# Patient Record
Sex: Male | Born: 1935
Health system: Southern US, Community
[De-identification: ages and names within clinical notes are randomized; demographics above are authoritative.]

## PROBLEM LIST (undated history)

## (undated) DIAGNOSIS — G039 Meningitis, unspecified: Secondary | ICD-10-CM

## (undated) DIAGNOSIS — I10 Essential (primary) hypertension: Secondary | ICD-10-CM

## (undated) DIAGNOSIS — G5600 Carpal tunnel syndrome, unspecified upper limb: Secondary | ICD-10-CM

## (undated) DIAGNOSIS — I480 Paroxysmal atrial fibrillation: Secondary | ICD-10-CM

## (undated) DIAGNOSIS — I739 Peripheral vascular disease, unspecified: Secondary | ICD-10-CM

## (undated) DIAGNOSIS — I779 Disorder of arteries and arterioles, unspecified: Secondary | ICD-10-CM

## (undated) DIAGNOSIS — E785 Hyperlipidemia, unspecified: Secondary | ICD-10-CM

## (undated) DIAGNOSIS — IMO0001 Reserved for inherently not codable concepts without codable children: Secondary | ICD-10-CM

## (undated) DIAGNOSIS — K279 Peptic ulcer, site unspecified, unspecified as acute or chronic, without hemorrhage or perforation: Secondary | ICD-10-CM

## (undated) DIAGNOSIS — I2510 Atherosclerotic heart disease of native coronary artery without angina pectoris: Secondary | ICD-10-CM

## (undated) DIAGNOSIS — Z953 Presence of xenogenic heart valve: Secondary | ICD-10-CM

## (undated) HISTORY — PX: VASECTOMY: SHX75

## (undated) HISTORY — PX: APPENDECTOMY: SHX54

## (undated) HISTORY — PX: HEMORRHOID SURGERY: SHX153

## (undated) HISTORY — PX: CORONARY ARTERY BYPASS GRAFT: SHX141

## (undated) HISTORY — DX: Disorder of arteries and arterioles, unspecified: I77.9

## (undated) HISTORY — DX: Atherosclerotic heart disease of native coronary artery without angina pectoris: I25.10

## (undated) HISTORY — PX: TONSILLECTOMY: SHX5217

## (undated) HISTORY — PX: CATARACT EXTRACTION W/ INTRAOCULAR LENS IMPLANT: SHX1309

## (undated) HISTORY — DX: Essential (primary) hypertension: I10

## (undated) HISTORY — PX: STERNOTOMY: SHX1057

## (undated) HISTORY — DX: Peripheral vascular disease, unspecified: I73.9

## (undated) HISTORY — DX: Carpal tunnel syndrome, unspecified upper limb: G56.00

## (undated) HISTORY — DX: Hyperlipidemia, unspecified: E78.5

## (undated) HISTORY — PX: GANGLION CYST EXCISION: SHX1691

## (undated) HISTORY — DX: Peptic ulcer, site unspecified, unspecified as acute or chronic, without hemorrhage or perforation: K27.9

## (undated) HISTORY — DX: Reserved for inherently not codable concepts without codable children: IMO0001

## (undated) HISTORY — DX: Paroxysmal atrial fibrillation: I48.0

## (undated) HISTORY — DX: Presence of xenogenic heart valve: Z95.3

## (undated) HISTORY — DX: Meningitis, unspecified: G03.9

---

## 1965-05-02 HISTORY — PX: INGUINAL HERNIA REPAIR: SHX194

## 1987-05-03 HISTORY — PX: PENILE PROSTHESIS IMPLANT: SHX240

## 1990-05-23 HISTORY — PX: CARDIAC CATHETERIZATION: SHX172

## 1990-07-12 HISTORY — PX: CORONARY ANGIOPLASTY: SHX604

## 1990-07-26 HISTORY — PX: CARDIAC CATHETERIZATION: SHX172

## 1991-05-17 HISTORY — PX: CARDIAC CATHETERIZATION: SHX172

## 1999-05-05 ENCOUNTER — Encounter: Payer: Self-pay | Admitting: Urology

## 1999-05-05 ENCOUNTER — Encounter: Admission: RE | Admit: 1999-05-05 | Discharge: 1999-05-05 | Payer: Self-pay | Admitting: Urology

## 2000-06-14 ENCOUNTER — Encounter: Admission: RE | Admit: 2000-06-14 | Discharge: 2000-06-14 | Payer: Self-pay | Admitting: Urology

## 2000-06-14 ENCOUNTER — Encounter: Payer: Self-pay | Admitting: Urology

## 2001-07-30 ENCOUNTER — Encounter: Payer: Self-pay | Admitting: Urology

## 2001-08-02 ENCOUNTER — Observation Stay (HOSPITAL_COMMUNITY): Admission: RE | Admit: 2001-08-02 | Discharge: 2001-08-03 | Payer: Self-pay | Admitting: Urology

## 2002-01-23 ENCOUNTER — Ambulatory Visit (HOSPITAL_COMMUNITY): Admission: RE | Admit: 2002-01-23 | Discharge: 2002-01-23 | Payer: Self-pay | Admitting: Cardiology

## 2002-01-23 ENCOUNTER — Encounter: Payer: Self-pay | Admitting: Cardiology

## 2002-04-09 ENCOUNTER — Encounter: Payer: Self-pay | Admitting: Emergency Medicine

## 2002-04-09 ENCOUNTER — Emergency Department (HOSPITAL_COMMUNITY): Admission: EM | Admit: 2002-04-09 | Discharge: 2002-04-09 | Payer: Self-pay | Admitting: Emergency Medicine

## 2002-07-25 ENCOUNTER — Ambulatory Visit (HOSPITAL_COMMUNITY): Admission: RE | Admit: 2002-07-25 | Discharge: 2002-07-25 | Payer: Self-pay | Admitting: Cardiology

## 2002-07-25 HISTORY — PX: CARDIAC CATHETERIZATION: SHX172

## 2002-10-18 ENCOUNTER — Encounter: Payer: Self-pay | Admitting: Gastroenterology

## 2002-10-18 ENCOUNTER — Encounter: Admission: RE | Admit: 2002-10-18 | Discharge: 2002-10-18 | Payer: Self-pay | Admitting: Gastroenterology

## 2002-11-13 ENCOUNTER — Ambulatory Visit (HOSPITAL_COMMUNITY): Admission: RE | Admit: 2002-11-13 | Discharge: 2002-11-13 | Payer: Self-pay | Admitting: Gastroenterology

## 2003-12-24 ENCOUNTER — Observation Stay (HOSPITAL_COMMUNITY): Admission: EM | Admit: 2003-12-24 | Discharge: 2003-12-26 | Payer: Self-pay | Admitting: Emergency Medicine

## 2004-01-16 ENCOUNTER — Emergency Department (HOSPITAL_COMMUNITY): Admission: EM | Admit: 2004-01-16 | Discharge: 2004-01-16 | Payer: Self-pay | Admitting: Emergency Medicine

## 2004-11-01 ENCOUNTER — Inpatient Hospital Stay (HOSPITAL_COMMUNITY): Admission: EM | Admit: 2004-11-01 | Discharge: 2004-11-06 | Payer: Self-pay | Admitting: Emergency Medicine

## 2004-11-01 ENCOUNTER — Encounter (INDEPENDENT_AMBULATORY_CARE_PROVIDER_SITE_OTHER): Payer: Self-pay | Admitting: *Deleted

## 2004-11-08 ENCOUNTER — Emergency Department (HOSPITAL_COMMUNITY): Admission: EM | Admit: 2004-11-08 | Discharge: 2004-11-08 | Payer: Self-pay | Admitting: Emergency Medicine

## 2004-11-10 ENCOUNTER — Ambulatory Visit (HOSPITAL_COMMUNITY): Admission: RE | Admit: 2004-11-10 | Discharge: 2004-11-10 | Payer: Self-pay | Admitting: General Surgery

## 2005-01-06 ENCOUNTER — Ambulatory Visit (HOSPITAL_COMMUNITY): Admission: RE | Admit: 2005-01-06 | Discharge: 2005-01-07 | Payer: Self-pay | Admitting: Urology

## 2006-04-14 ENCOUNTER — Encounter: Payer: Self-pay | Admitting: Cardiology

## 2006-06-20 ENCOUNTER — Encounter
Admission: RE | Admit: 2006-06-20 | Discharge: 2006-06-20 | Payer: Self-pay | Admitting: Physical Medicine and Rehabilitation

## 2007-04-02 HISTORY — PX: CARDIAC CATHETERIZATION: SHX172

## 2007-04-19 ENCOUNTER — Inpatient Hospital Stay (HOSPITAL_BASED_OUTPATIENT_CLINIC_OR_DEPARTMENT_OTHER): Admission: RE | Admit: 2007-04-19 | Discharge: 2007-04-19 | Payer: Self-pay | Admitting: Cardiology

## 2007-04-20 ENCOUNTER — Ambulatory Visit: Payer: Self-pay | Admitting: Cardiothoracic Surgery

## 2007-05-31 ENCOUNTER — Encounter: Payer: Self-pay | Admitting: Cardiothoracic Surgery

## 2007-06-04 ENCOUNTER — Inpatient Hospital Stay (HOSPITAL_COMMUNITY): Admission: RE | Admit: 2007-06-04 | Discharge: 2007-06-11 | Payer: Self-pay | Admitting: Cardiothoracic Surgery

## 2007-06-04 ENCOUNTER — Encounter: Payer: Self-pay | Admitting: Cardiothoracic Surgery

## 2007-06-04 ENCOUNTER — Ambulatory Visit: Payer: Self-pay | Admitting: Cardiothoracic Surgery

## 2007-06-12 ENCOUNTER — Encounter (INDEPENDENT_AMBULATORY_CARE_PROVIDER_SITE_OTHER): Payer: Self-pay | Admitting: Family Medicine

## 2007-06-28 ENCOUNTER — Ambulatory Visit: Payer: Self-pay | Admitting: Cardiothoracic Surgery

## 2007-06-28 ENCOUNTER — Encounter: Admission: RE | Admit: 2007-06-28 | Discharge: 2007-06-28 | Payer: Self-pay | Admitting: Cardiothoracic Surgery

## 2008-08-17 IMAGING — CR DG CHEST 2V
2 series · 2 of 2 positions shown · non-contrast
Comparison: 06/09/07.

CLINICAL DATA: Status post CABG and valve replacement, follow-up.
 CHEST X-RAY:

[view not recorded (1 of 2)]
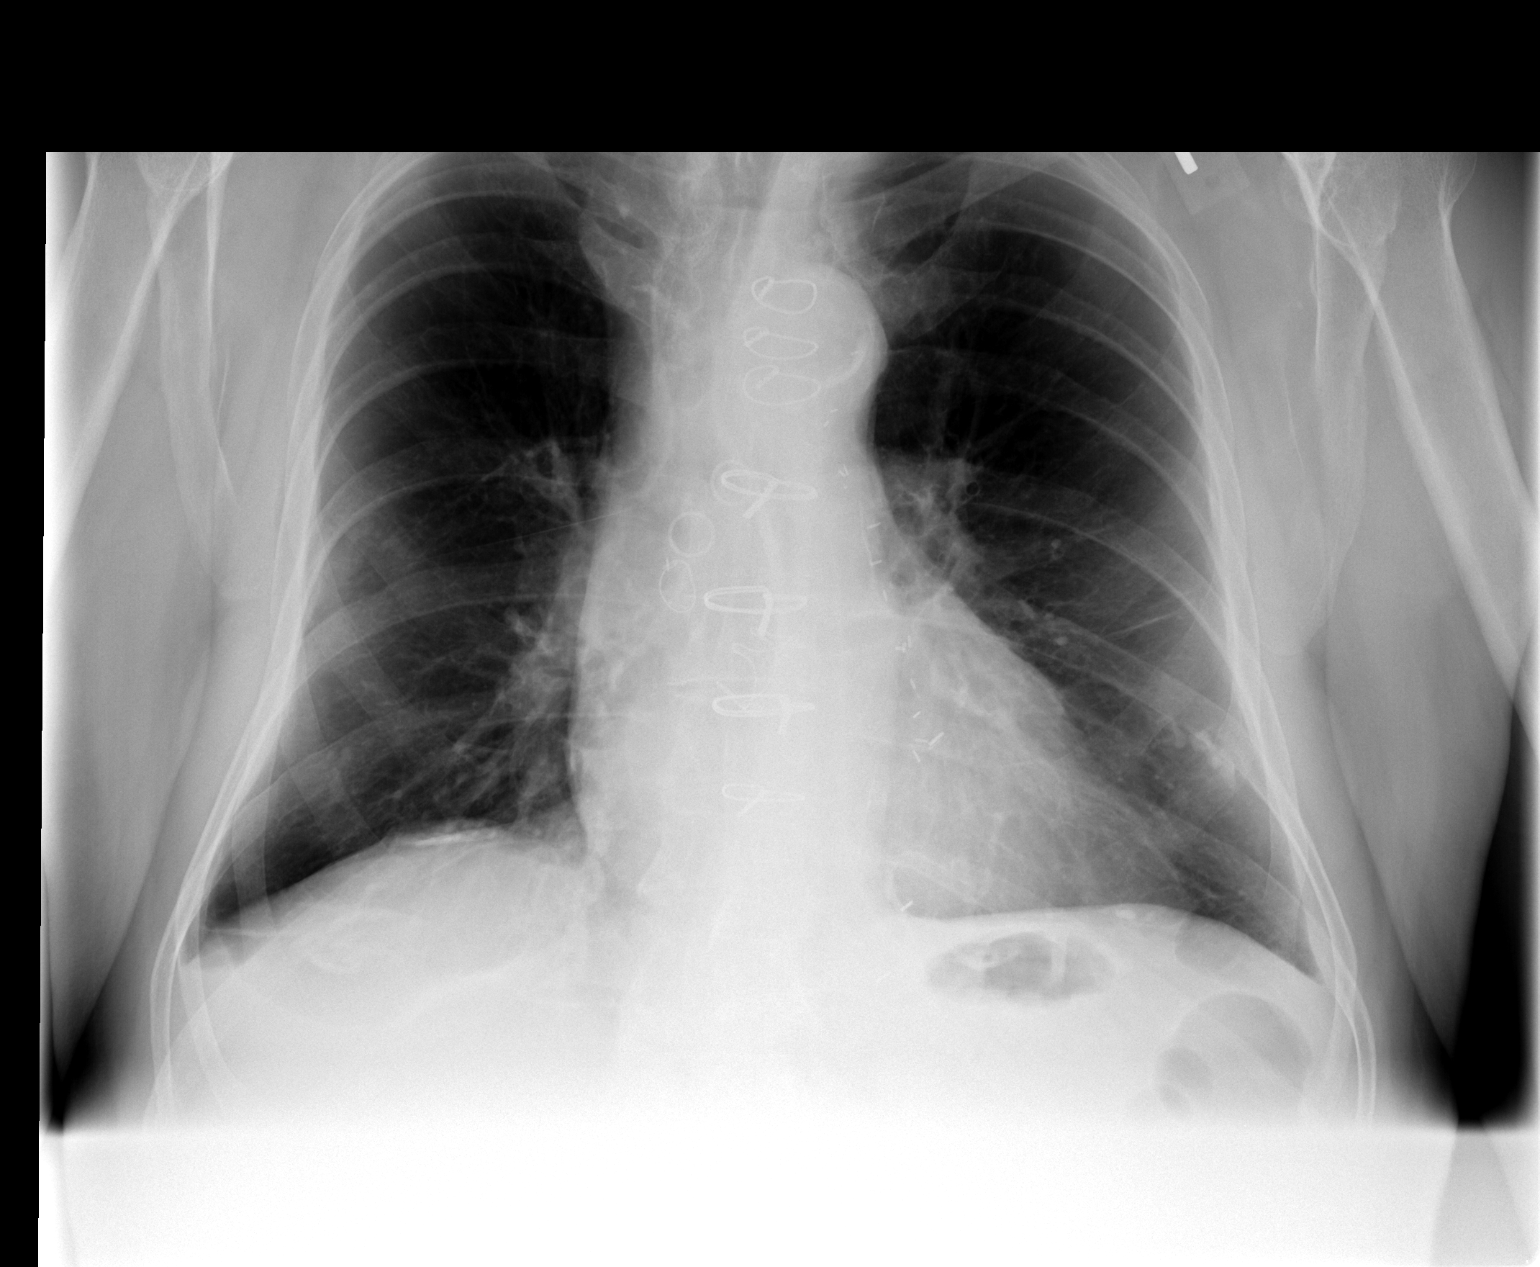

[view not recorded (2 of 2)]
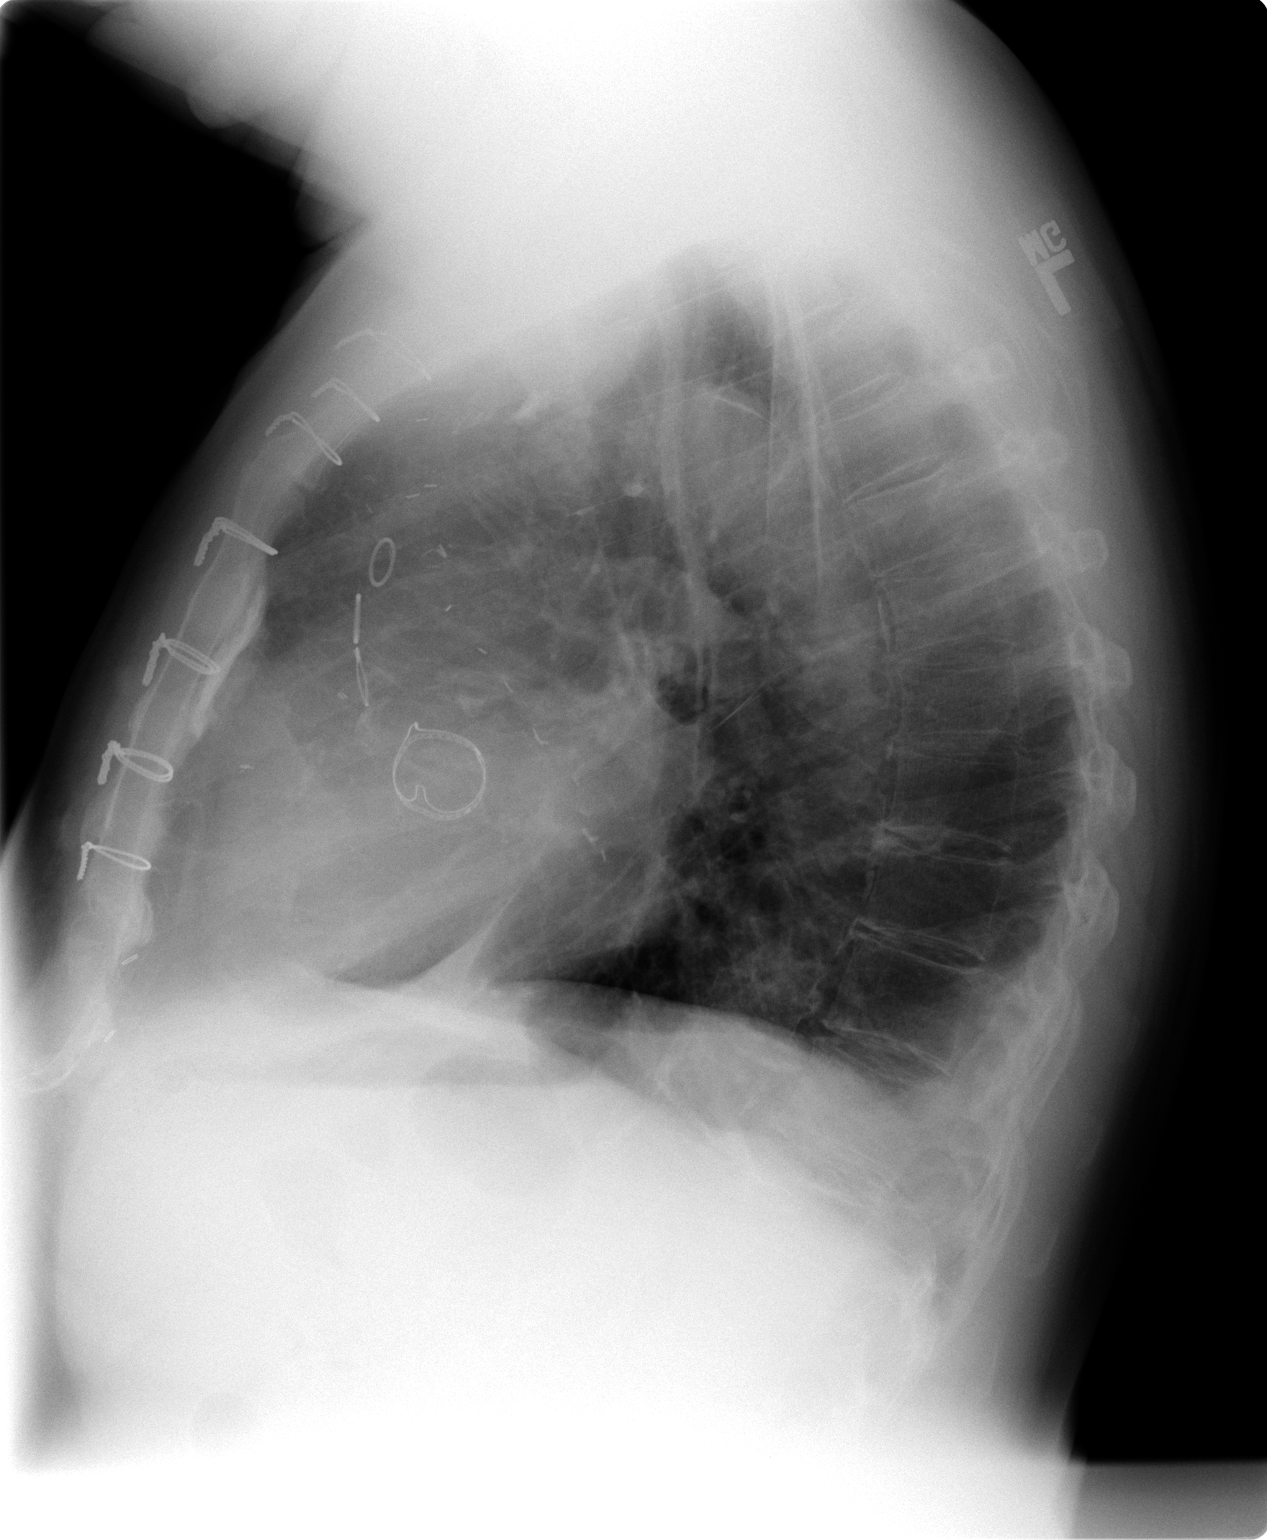

[2 of 2 positions shown; findings below may reference images not displayed]

Two views of the chest show improved aeration with only tiny effusions remaining.  Heart size is stable. Median sternotomy sutures are noted.  Calcifications of the hemidiaphragms and calcified left pleural plaques remain.
IMPRESSION: Improved aeration with decreasing basilar atelectasis and only tiny effusions remaining.

## 2010-04-05 ENCOUNTER — Ambulatory Visit: Payer: Self-pay | Admitting: Cardiology

## 2010-09-14 NOTE — Assessment & Plan Note (Signed)
OFFICE VISIT   [name], [name]  DOB:  [birth year]                                        June 28, 2007  CHART #:  [id number]   The patient returns to the office today for a follow-up visit after his  redo median sternotomy with aortic valve replacement with pericardial  tissue valve 23 mm and redo coronary artery bypass grafting done on  June 04, 2007.  He had previously undergone coronary artery bypass  grafting in 1993.  Considering his age of 73 years and redo coronary  aortic valve, he is doing exceptionally well.  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  He has had no recurrent angina or evidence of  congestive heart failure. He is increasing his physical activity  appropriately.   PHYSICAL EXAMINATION:  VITAL SIGNS:  Blood pressure 115/58, pulse 88,  and respiratory rate 18, O2 saturations 96%.  CHEST:  His sternum is stable and well-healed.  LUNGS:  Clear bilaterally.  HEART:  Regular rate and rhythm.  His valve sounds are crisp.  There is  no murmur of aortic insufficiency.  EXTREMITIES:  He has no pedal edema.   Follow-up chest x-ray shows improved aeration and decrease in basilar  atelectasis and only very tiny pleural effusions.  Much improved from  his in hospital postoperative films.   I am very pleased with his overall progress.  I have warned him about  returning to lifting and using his chainsaw too early and to wait at  least 2-3 months before doing any heavy work with his arms.   He will continue on his medications as outlined by [name], M.D.   CURRENT MEDICATIONS:  1. Vytorin.  2. Lotrel.  3. Folic acid.  4. Norvasc.  5. Lopressor.  6. Aspirin.   I have told him he can stop his folic acid when the current prescription  completes.  I will plan to see him back p.r.n. or at [name]  request at any time.   Sheliah Plane, MD  Electronically Signed   EG/MEDQ  D:  06/28/2007  T:  06/29/2007  Job:  [id number]   cc:   [name], M.D.  Gabriel Earing, M.D.

## 2010-09-14 NOTE — Consult Note (Signed)
NEW PATIENT CONSULTATION   [name], [name]  DOB:  02/10/1925                                        April 20, 2007  CHART #:  [id number]   REQUESTING PHYSICIAN:  Dr. Swaziland.   FOLLOW-UP CARDIOLOGIST:  Dr. Swaziland.   PRIMARY CARE PHYSICIAN:  Dr. Gabriel Earing.   REASON FOR CONSULTATION:  Aortic stenosis and coronary artery disease.   HISTORY OF PRESENT ILLNESS:  The patient is a 77 year old male with  progressive aortic stenosis and known coronary occlusive disease.  Patient, over the past 6 months has had episodes of exertional angina.  He has been instructed by Dr. 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  The patient notes that with an exertion he gets tightness under  his arms, usually with walking up hill or heavy exertion.  He has had  rare episodes of lightheadedness, no syncope, very mild pedal edema, no  PND.  He has been followed for moderate aortic stenosis for several  years with serial echocardiograms.  Most recent echocardiogram reveals  moderate LVH, an estimated valve area of 0.8 with a peak velocity of 3.3  meters per second and overall preserved LV function.  In 1993, the  patient underwent coronary artery bypass grafting, with the left  internal mammary to the obtuse marginal coronary artery and reverse  saphenous vein graft to the distal right coronary artery.  He has done  well over the years.  A repeat cardiac catheterization in 2004 showed no  significant graft disease.  The patient underwent cardiac  catheterization yesterday, because of his ongoing symptoms and is now  referred for consideration of re-do aortic valve replacement and  coronary artery bypass grafting.  Cardiac risk factors include  hypertension, hyperlipidemia.  Denies diabetes.  Is a remote smoker,  quit in 1980s.  He has had no previous stroke, denies claudication, has  no history  of renal insufficiency.   PAST MEDICAL HISTORY:  Includes cerebrovascular disease with a 40% to  60% left internal carotid artery stenosis noted in 2003, history of  carpal tunnel, bilateral hernia repair, hemorrhoidectomy, tonsillectomy,  history of penile implant.   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   CURRENT MEDICATIONS:  1. Aspirin 81 mg a day.  2. Lotrel 5/20 mg a day.  3. Vytorin 10/20 a day.  4. Ibuprofen 800 mg a day.   ALLERGIES:  HE NOTES THAT CODEINE CAUSES CONFUSION AND AGITATION.  PENICILLIN CAUSES RASH.   CARDIAC REVIEW OF SYSTEMS:  Positive for rare pre-syncope and episodes  of exertional chest pain.   GENERAL REVIEW OF SYSTEMS:  The patient's weight remains stable, 175  pounds.  He denies fever, chills, night sweats or other constitutional  symptoms.  VISION:  He denies true amaurosis, notes that he has a  floater in his right eye at times.  Denies wheezing, hemoptysis or  shortness of breath.  Denies gallstones, vomiting, nausea, melena or  hematochezia.  He has had no history of kidney stones.  Denies  hematuria.  Mild peripheral edema.  Has complaints of carpal tunnel  symptoms in both  hand, and arthritic pain in knees and hips.  Denies  easy bruisability, denies stroke.  Has no history of psychiatric  disease.  Has had upper and lower full extractions and wears dentures.   PHYSICAL EXAMINATION:  GENERAL:  The patient is alert and neurology  intact.  VITAL SIGNS:  His blood pressure is 156/75, pulse is 68 and regular,  respiratory rate 18.  O2 sats 95%.  The patient is awake, alert.  NECK:  He has bilateral carotid bruits from radiation of a murmur of  aortic stenosis.  LUNGS:  Clear bilaterally.  CARDIAC:  Reveals regular rate and rhythm with a holosystolic 3/6  murmur, best heard along the left sternal border.   ABDOMEN:  Benign without palpable masses or organomegaly.  The aorta is  not palpably enlarged.  The right groin cath site is free of bruising or  hematoma.  Distally, he has a well-healed incision from the ankle to the  mid-calf from previous vein harvest.  He has +1 DP and PT pulses  bilaterally.   Cardiac catheterization films are reviewed and discussed with Dr.  Swaziland.  The patient has at time of cath, a calculated valve area of 1.0  by echo, 0.8, overall ventricular function appeared preserved.  He has  irregular disease in the LAD but no high-grade stenosis.  Confusion is  totally occluded with the left internal mammary supplying the first  obtuse marginal.  There is a 90% ostial lesion in the LAD at it's  takeoff from the subclavian.  The main graft to the right coronary  artery has some luminal irregularities, no high-grade stenosis.   IMPRESSION:  The patient with concomitant recurrent coronary artery  disease, in an unfavorable location for angioplasty at the ostium of the  left internal mammary graft origin, with at least moderate-to-severe  aortic stenosis with angina, with exertion.  The case has been discussed  with Dr. Swaziland in detail.  It is very unlikely that, with the degree of  aortic stenosis, that the patient will avoid aortic valve replacement  with his recurrent coronary disease and a technically difficult stenting  situation, proceeding with concomitant aortic valve replacement and re-  do coronary artery bypass grafting and patient, although 82, is remains  active and with reasonable operative risk.  This has been discussed with  the patient, and he is willing to pursue tentatively a plan for Monday,  February 2, unless the patient has progressive symptoms.   Sheliah Plane, MD  Electronically Signed   EG/MEDQ  D:  04/20/2007  T:  04/21/2007  Job:  [id number]   cc:   Peter M. Swaziland, M.D.  Gabriel Earing, M.D.

## 2010-09-14 NOTE — Cardiovascular Report (Signed)
NAMESHREY, BOIKE NO.:  [id number]   MEDICAL RECORD NO.:  [id number]          PATIENT TYPE:  OIB   LOCATION:  NA                           FACILITY:  [facility]   PHYSICIAN:  Peter M. Swaziland, M.D.  DATE OF BIRTH:  03-Jul-1924   DATE OF PROCEDURE:  DATE OF DISCHARGE:                            CARDIAC CATHETERIZATION   HISTORY OF PRESENT ILLNESS:  Mr. Harkin is an 77 year old white male  who presents with increasing anginal symptoms.  He has a known history  of coronary disease status post CABG x2 in 1993 including an LIMA graft  to obtuse marginal branch and a the vein graft to the right coronary.  By echocardiogram he has developed a severe aortic stenosis.   PROCEDURES:  1. Right and left heart catheterization.  2. Coronary and left ventricular angiography.  3. Saphenous vein graft angiography times x1.  4. Left internal mammary artery graft angiography.   ACCESS:  Via the right femoral artery and vein using standard Seldinger  technique.   EQUIPMENT USED:  4-French, 4-cm left Judkins catheter, 4-French right  Judkins catheter, 4-French LIMA catheter, 4-French arterial sheath, 5-  French venous sheath, and a 5-French balloon-tip Swan-Ganz catheter.   MEDICATIONS:  Local anesthesia 1% Xylocaine, Versed 2 mg IV.   CONTRAST:  125 mL of Omnipaque.   HEMODYNAMIC DATA:  Right atrial pressure 6/5 with mean of 3 mmHg.  Right  ventricular pressures 30 with an EDP of 3 mmHg.  Pulmonary artery  pressures 30/8 with a mean of 18 mmHg.  Pulmonary capillary wedge  pressures 15/13 with a mean of 11 mmHg.  Aortic pressures 179/69 with a  mean of 112 mmHg.  The left ventricular pressure is 220 with an EDP of  17 mmHg.  By pullback, aortic valve peak gradient was 24 mmHg with a  mean gradient of 19 mmHg.  Aortic valve area was calculated at 1 sq. cm.  Cardiac output by thermodilution was 3.2 L/min. with an index of 1.7.  By Hiram Comber, output was 4.2 with an index of 2.23.   There was no significant  mitral valve gradient.   ANGIOGRAPHIC DATA:  Left ventricular angiography was performed in the  RAO view.  This demonstrates normal left ventricular size and  contractility with normal systolic function.  Ejection fraction is  estimated at 60%.  The aortic valve is heavily calcified.  There is also  mild mitral annular calcification.   The left coronary arises and distributes normally.  The left main  coronary artery has 10-20% irregularity proximally.  The left anterior  descending artery is widely patent.  It has approximately 20% narrowing  proximally.  The first diagonal branch is relatively small and also has  20% narrowing proximally.   The left circumflex coronary gives rise to a very tiny marginal branch  and is then occluded.   The right coronary artery is occluded proximally.   There is a saphenous vein graft to the dominant right coronary artery,  which is widely patent throughout with excellent runoff.   The LIMA graft to the OM is patent.  However, there is a 90% ostial  stenosis of the IMA graft.  It is noteworthy that there was no catheter  engagement of the IMA, in fact, even changing for an IMA catheter were  unable to directly engage the IMA; however, excellent flush shots were  obtained demonstrating a severe ostial stenosis which did not resolve  with intracoronary nitroglycerin.  There was no significant subclavian  stenosis.   FINAL INTERPRETATION:  1. Severe two-vessel obstructive coronary artery disease.  2. Patent saphenous vein graft to the right coronary.  3. Severe stenosis at the ostium of the left internal mammary artery      graft to the obtuse marginal branch.  4. Normal left ventricular function.  5. Severe aortic stenosis.  6. Normal right heart pressures.   PLAN:  We will discuss further measures for treatment, including  possible redo bypass surgery with aortic valve replacement versus  angioplasty of the ostium  of the marginal branch.           ______________________________  Peter M. Swaziland, M.D.     PMJ/MEDQ  D:  04/19/2007  T:  04/20/2007  Job:  [id number]   cc:   Windle Guard, M.D.  Sheliah Plane, MD

## 2010-09-14 NOTE — Op Note (Signed)
[name], [name] NO.:  [id number]   MEDICAL RECORD NO.:  [id number]          PATIENT TYPE:  INP   LOCATION:  2306                         FACILITY:  [facility]   PHYSICIAN:  Sheliah Plane, MD    DATE OF BIRTH:  Feb 07, 1925   DATE OF PROCEDURE:  06/04/2007  DATE OF DISCHARGE:                               OPERATIVE REPORT   PREOPERATIVE DIAGNOSIS:  Critical aortic stenosis and recurrent coronary  occlusive disease.   POSTOPERATIVE DIAGNOSIS:  Critical aortic stenosis and recurrent  coronary occlusive disease.   SURGICAL PROCEDURES:  Redo median sternotomy with aortic valve  replacement with a pericardial tissue valve, [employer] model  3000, 23 mm, serial number [id number] and coronary artery bypass grafting  x2 with reverse saphenous vein graft to the posterior descending and  reverse saphenous vein graft to the left internal mammary artery as a  previous bypass to the circumflex with endovein harvesting.   SURGEON:  Sheliah Plane, MD   FIRST ASSISTANT:  Corey Ceo, PA   BRIEF HISTORY:  The patient is an 77 year old male who in 1993 underwent  coronary artery bypass grafting.  At that time mammary artery was placed  to the circumflex and reversed saphenous vein graft to the posterior  descending.  Over the years the patient has done extremely well until  recently began having increasing anginal symptoms and was found to have  critical aortic stenosis by TEE.  In the OR estimated valve area was  0.88 at cath just about 1, in addition, since the cath and 2004 he had  developed stenosis at the takeoff of the mammary artery from the  subclavian.  Because of his critical aortic stenosis, aortic valve  replacement and redo coronary artery bypass grafting was recommended.  The patient agreed and signed informed consent.   DESCRIPTION OF PROCEDURE:  With Swan-Ganz and arterial line monitors in  place.  The patient underwent general endotracheal  anesthesia without  incidence.  Skin of the chest and legs was prepped with Betadine and  draped in the usual sterile manner.  Using the Guidant endovein  harvesting system, vein was harvested from the right thigh and upper  calf and was of good quality and caliber.  Median sternotomy was  performed with a sagittal saw and underlying tissue was dissected free  from the posterior table of the sternum.  With tedious dissection, the  ascending aorta and the right atrium were identified.  The previous vein  graft to the posterior descending was also identified.  With the  ascending aorta right atrium dissected free enough to cannulation, the  patient was systemically heparinized.  The ascending aorta was  cannulated.  The right atrium was cannulated.  Retrograde cardioplegia  catheter was placed.  The patient was placed on cardiopulmonary bypass  and remainder of dissection of the myocardium was carried out.  The path  of the left internal mammary artery graft through the pericardium into  the was identified and encircled with a vessel loop.  The patient's body  temperature cooled to 30 degrees.  Aortic crossclamp was applied and  500  mL of cold blood potassium cardioplegia was administered.  Myocardial  septal temperature was monitored throughout crossclamp period.  Attention was turned first to the additional cold blood cardioplegia was  administered retrograde.  A bulldog was placed on the patent left  internal mammary artery.  Attention was turned first to the posterior  descending coronary artery which was opened just in the area of the  previous anastomosis.  Using running 7-0 Prolene distal anastomosis was  performed.  Additional cold blood cardioplegia administered down the  vein graft.  Attention was then turned to the mammary artery which was  decided to __________  was of good quality and was no angiographic  evidence of problems with the distal anastomosis, it was decided to   place a vein graft from the ascending aorta to the midportion of the  left internal mammary.  The mammary artery was opened and a segment of  vein was harvested and was anastomosed to the left internal mammary  artery end-to-side with a running 8-0 Prolene.  Additional cold blood  cardioplegia was and was administered intermittently down the vein  grafts.  The old vein graft to the right coronary artery, although it  did not look particularly diseased was removed from the ascending aorta  to allow adequate exposure of the proximal ascending aorta for the  valve.  The aortotomy was performed.  This gave good exposure of the  aortic valve which was tricuspid highly calcified valve.  The valve was  excised and the annulus was debrided of calcium.  Annulus was sized for  23 [employer] pericardial tissue valve model 3000 Magna,  serial number [id number].  #2 Tycron pledgeted sutures were placed  circumferentially around the annulus and used to seat the valve and used  to secure the valve in place which seated well.  Care was taken to  remove all loose calcific debris during the debridement and placement of  the valve.  With aortic valve seated well, the aortotomy was then closed  with horizontal mattress 3-0 Prolene suture over felt strips and the  second running over-and-over layer.  With the aortic crossclamp still in  place two punch aortotomies were performed and each of the segments of  vein graft were anastomosed to the ascending aorta.  Air was evacuated  from the heart and the ascending aorta and aortic crossclamp was removed  with total crossclamp time of 136 minutes.  Initially the patient was in  complete heart block but ultimately converted to a sinus rhythm  requiring only atrial pacing for rate.  With the body temperature  rewarmed to 37 degrees, TEE showed good function of the valve, right  superior pulmonary vein vent was removed.  The patient was then  ventilated and  weaned cardiopulmonary bypass.  He remained  hemodynamically stable, was decannulated in the usual fashion.  Protamine sulfate was administered.  Because of diffuse oozing platelets  and fresh frozen was also administered.  With operative field  hemostatic, a Blake mediastinal drain posterior and anterior were  placed.  Some mediastinal fat tissue was used to close over the  ascending aorta though there was not much pericardium left to close.  The sternum was then closed with #6 stainless steel wire.  Fascia closed  with interrupted 0-0 Vicryl, running 3-0 Vicryl subcutaneous tissue, 4-0  subcuticular stitch in skin edges.  Dry dressings were applied.  Sponge  and needle count was reported as correct at completion of the procedure.  The patient tolerated the procedure without obvious complication and was  transferred to the surgical intensive care unit for further  postoperative care.  Total pump time was 139 minutes.  Total crossclamp  136 minutes.      Sheliah Plane, MD  Electronically Signed     EG/MEDQ  D:  06/05/2007  T:  06/05/2007  Job:  [id number]   cc:   Peter M. Swaziland, M.D.

## 2010-09-14 NOTE — H&P (Signed)
NAMEMarland Kitchen  [name], Corey NO.:  [id number]   MEDICAL RECORD NO.:  [id number]           PATIENT TYPE:   LOCATION:                                 FACILITY:   PHYSICIAN:  Peter M. Swaziland, M.D.       DATE OF BIRTH:   DATE OF ADMISSION:  04/19/2007  DATE OF DISCHARGE:                              HISTORY & PHYSICAL   HISTORY OF PRESENT ILLNESS:  [name] is an 77 year old white male  who was evaluated for increased anginal symptoms.  He describes a  several month history of worsening bilateral arm pain with exertion  associated with dyspnea.  It is relieved with rest.  He has had no  significant chest pain.  The patient has known history of coronary  disease and is status post coronary bypass surgery in 1993 including  LIMA graft to the marginal branch and a saphenous vein graft to the  right coronary artery.  He had a cardiac catheterization in 2004 which  showed continued patency of these grafts.  He has had evidence of  progressive aortic stenosis by echocardiogram with most recent  echocardiogram showing normal left ventricular function and moderate  LVH.  His aortic valve peak velocity was 3.3 meters per second with a  mean gradient of 28 mmHg, peak gradient of 43 mmHg and aortic valve area  0.83 cm2.  Compared to echocardiogram from a year ago, this has  progressed.  Because he is having increased symptoms, he is now admitted  for cardiac catheterization to assess his aortic valve and graft status  with consideration of possible repeat surgery if indicated.   PAST MEDICAL HISTORY:  1. Hypertension.  2. Hyperlipidemia.  3. He has no history of diabetes.  4. He has a remote history of peptic ulcer disease.  Carotid Doppler      studies in 2003 showed 40-60% stenosis of the left ICA.  5. He has a remote history of meningitis.   PRIOR SURGERIES:  1. Bilateral inguinal hernia repair.  2. Previous hemorrhoidectomy.  3. T&A.  4. He has had a previous penile  implant.  5. Prior coronary bypass surgery.   ALLERGIES:  PENICILLIN AND CODEINE.   CURRENT MEDICATIONS:  1. Aspirin 325 mg daily.  2. Lotrel 5/20 mg per day.  3. Vytorin 10/20 mg per day.  4. Ibuprofen 800 mg daily.   SOCIAL HISTORY:  The patient is retired, previously worked for the city  of [employer].  He is also worked as a Electrical engineer.  He enjoys  woodworking.  He is married and has three children.  He quit smoking in  the early 1980s, and denies alcohol use is   FAMILY HISTORY:  Father died age 97 of intestinal cancer.  One brother  died of polio   REVIEW OF SYSTEMS:  He has had no increased edema, orthopnea or PND.  Has had no dizziness or syncope.  Denies any palpitations.  He has had  no bleeding episodes.  Other review of systems are negative.   PHYSICAL EXAMINATION:  GENERAL:  He is a pleasant  white male in no  distress.  VITAL SIGNS:  Weight is 172, blood pressure 150/70, pulse is 66 and  regular.  HEENT:  He is normocephalic, atraumatic.  Pupils equal, round, reactive  to light accommodation.  Extraocular movements are full.  The patient  does wear glasses.  Oropharynx is clear with good dental repair.  NECK:  Without JVD, adenopathy, thyromegaly or bruits.  LUNGS:  Clear.  CARDIAC:  Exam reveals regular rate and rhythm without gallop, murmur or  click.  He has no lower extremity edema.  Pedal pulses were good.  NEUROLOGIC:  Exam is nonfocal.   LABORATORY DATA:  Chest x-ray shows some chronic pleural plaques but no  active disease.  ECG shows normal sinus rhythm and is normal.  Echocardiogram results are as noted.   IMPRESSION:  1. Symptoms of increased angina, presumably due to progressive aortic      stenosis.  Need to rule out recurrent atherosclerotic disease in      his grafts.  2. Status post coronary artery bypass graft in 1993 x2.  3. Hypertension.  4. Hyperlipidemia.   PLAN:  The patient be admitted for cardiac catheterization with further   therapy pending these results.           ______________________________  Peter M. Swaziland, M.D.     PMJ/MEDQ  D:  04/17/2007  T:  04/18/2007  Job:  [id number]   cc:   [name], M.D.  Corey Plane, MD

## 2010-09-14 NOTE — Discharge Summary (Signed)
NAMESTEFON, [name]               ACCOUNT NO.:  [id number]   MEDICAL RECORD NO.:  [id number]          PATIENT TYPE:  OUT   LOCATION:  VASC                         FACILITY:  [facility]   PHYSICIAN:  Sheliah Plane, MD    DATE OF BIRTH:  Mar 24, 1925   DATE OF ADMISSION:  05/31/2007  DATE OF DISCHARGE:  05/31/2007                               DISCHARGE SUMMARY   FINAL DIAGNOSES:  1. Critical aortic stenosis.  2. Recurrent coronary occlusive disease.   IN-HOSPITAL DIAGNOSES:  1. Postoperative atrial fibrillation.  2. Volume overload postoperatively.  3. Acute blood loss anemia postoperatively.   SECONDARY DIAGNOSES:  1. Cerebrovascular disease with a 40-60% left internal carotid artery      stenosis noted in 2003.  2. History of carpal tunnel.  3. Bilateral hernia repair.  4. Status post hemorrhoidectomy.  5. Status post tonsillectomy.  6. History of penile implant.   IN-HOSPITAL OPERATIONS AND PROCEDURES:  1. Redo median sternotomy with aortic valve replacement using      pericardial tissue valve  23 mm.  2. Coronary artery bypass grafting x2 using a reverse saphenous vein      graft to posterior descending, reverse saphenous vein graft to left      internal mammary artery at the previous bypass to the circumflex      with a no vein harvesting.   HISTORY AND PHYSICAL AND HOSPITAL COURSE:  The patient is an 77 year old  male who in 1993 underwent coronary artery bypass grafting.  At that  time, mammary artery was placed in the circumflex and reverse saphenous  vein graft to posterior descending.  Over the years, the patient has  done extremely well until recently began having increasing anginal  symptoms and was found to have critical aortic stenosis by TEE.  In the  OR, estimated valve area was 0.88 at cath, just about one.  In addition  to the catheterization in 2004, he had developed stenosis, had to take  off the mammary artery from the subclavian.  Because of the  patient's  critical aortic stenosis, aortic flow replacement and redo coronary  artery bypass grafting was recommended.  Dr. Tyrone Sage discussed risks  and benefits with the patient.  The patient acknowledged understanding  and agreed to proceed.  Surgery was scheduled for June 04, 2007.  For  details of the patient's past medical history and physical exam, please  see dictated H&P.   The patient was taken to the operating room on June 04, 2007, where  he underwent redo median sternotomy on __________ and replacement using  a pericardial tissue valve.  There was __________ 23 mm.  He also had a  coronary artery bypass grafting reduced x2 using a reverse saphenous  vein graft to posterior descending, reverse saphenous vein graft to left  internal mammary artery at the previous bypass to circumflex with  endovein harvesting.  The patient tolerated this procedure well and  transferred to the intensive care unit in stable condition.  Postoperatively, the patient was noted to be hemodynamically stable.  He  was extubated the evening of surgery.  Post  extubation the patient was  noted to be alert and oriented x4.  Neurologically intact.  Post  extubation, the patient was placed on nasal cannula.  He was sating  greater than 90%.  Chest x-ray done postop day #1 showed a left pleural  effusion.  The patient had mild drainage from chest tube.  Repeat chest  x-ray the following morning was stable.  Chest tubes were discontinued  at this time.  Repeat chest x-ray on postop day #3 showed left lower  lobe atelectasis with stable effusion.  The patient was using his  incentive spirometer.  He was able to be weaned off oxygen sating  greater than 90% on room air.  Repeat chest x-ray done prior to  discharge showed small left effusion with left lower lobe atelectasis.  This was stable and improving.  The patient had been started on  diuretics for the effusion as well as for volume overload.  Plan  to  continue the patient on diuretics at discharge.  Volume overload was  monitored and daily weights were obtained.  The patient was back near  baseline weight prior to discharge home.  Postoperatively, the patient  was in normal sinus rhythm.  He was able to be weaned from all drips.  Heart rate and blood pressure stable.  He was able to be started on low-  dose Lopressor as well as restarted on Lotrel.  The patient had a short  episode of rapid atrial fibrillation, but converted back to normal sinus  rhythm on his own.  No treatment was started at this time.  The patient  was continued on Lotrel and Lopressor.  No further episodes of atrial  fibrillation noted.  The patient remained in normal sinus rhythm.  External pacing wires were discontinued in normal fashion.  The patient  tolerated well.  Postoperatively, the patient did have acute blood loss  anemia.  Hematocrit was 22.7% postop day #1.  No transfusion was done at  this time.  Repeat hematocrit checked in a.m. and improving to 26%.  The  patient was asymptomatic.  H&H remained stable and improving prior to  discharge home.  Last hemoglobin and hematocrit postop day #4 was 9.4  and 27.5%.  The patient was out of bed ambulating well with cardiac  rehab.  He was progressing well, ambulating without assistance.  The  patient was tolerating diet well.  No nausea, vomiting noted.     On June 11, 2007, the patient was noted to be afebrile.  O2 sats  greater than 90%.  Heart rate and blood pressure stable.  The patient  was back near baseline weight prior to discharge, estimated baseline  weight.   LABORATORY DATA:  Postop day #4 showed a white count of 8.3, hemoglobin  9.4, hematocrit 27.5, platelet count 153.  Sodium was 138, potassium  3.5, chloride of 104, bicarb of 29, BUN of 25, creatinine 119, glucose  132.  The patient was in normal sinus rhythm.  Pulmonary status stable.  All incisions were clean, dry and intact and  healing well.  The patient  was felt to be stable and ready for discharge home June 11, 2007.   FOLLOW-UP APPOINTMENTS:  A follow-up appointment will be arranged with  Dr. Tyrone Sage in 3 weeks.  Our office will contact the patient with  information.  The patient will need to obtain PMI chest x-ray 30 minutes  prior to this appointment.  The patient will need to follow up with Dr.  Swaziland in 2 weeks.  He will need to contact Dr. Elvis Coil office to make  these arrangements.   DISCHARGE INSTRUCTIONS:  1. Activity:  Patient instructed no driving until released to do so,      no lifting over 10 pounds.  He is told to ambulate 3-4 times per      day, progress as tolerated and continue his breathing exercises.  2. Incisional Care:  The patient is told to shower washing his      incisions using soap and water.  He is to contact the office if he      develops any drainage or opening from any of his incision sites.  3. Diet:  The patient again on diet to be low-fat, low-salt.   DISCHARGE MEDICATIONS:  1. Aspirin 325 mg daily.  2. Lopressor 50 mg b.i.d.  3. Vytorin 10/20 mg daily.  4. Folic acid 1 mg daily.  5. Lasix 40 mg daily times 7 days.  6. Potassium chloride 20 mEq daily times 7 days.  7. Lotrel 10/20 mg daily.  8. Oxycodone 5 mg 1-2 tablets q. for 6 hours p.r.n.      Theda Belfast, [place]      Sheliah Plane, MD  Electronically Signed    KMD/MEDQ  D:  06/11/2007  T:  06/12/2007  Job:  [id number]   cc:   Sheliah Plane, MD  Peter M. Swaziland, M.D.

## 2010-09-17 NOTE — Discharge Summary (Signed)
NAME:  [name], [name] NO.:  [id number]   MEDICAL RECORD NO.:  [id number]                   PATIENT TYPE:  OBV   LOCATION:  3742                                 FACILITY:  [facility]   PHYSICIAN:  Peter M. Swaziland, M.D.               DATE OF BIRTH:  [birth year]   DATE OF ADMISSION:  12/24/2003  DATE OF DISCHARGE:  12/26/2003                                 DISCHARGE SUMMARY   HISTORY OF PRESENT ILLNESS:  Mr. Woodberry is a 77 year old white male with  history of coronary artery disease, status post prior coronary artery bypass  graft surgery.  He presented with symptoms of pain in his chest.  He had  recently been treated for a prostate/kidney infection with a 14-day course  of sulfa drugs.  He subsequently developed a pain like an elephant sitting  on his chest which lasted for approximately seven days.  This then resolved  and is now just a persistent low grade ache.  He presented for further  evaluation.  For details of his past medical history, social history, family  history, and physical exam, please see admission history and physical.   LABORATORY DATA:  Point of care cardiac enzymes were negative x 3.   In the emergency room, chest x-ray showed no active disease.  ECG was  normal.  TSH was 5.713.  T4 was normal at 7.2.  T3 uptake was 33.4.  Calcium  was 8.8, magnesium 1.9.  Subsequent cardiac enzymes were negative x 2.  Liver function studies were normal.  Sodium was 132, potassium 5.8, chloride  108, CO2 17, BUN 43, and creatinine 1.9.  Glucose of 97.  A CBC was normal.   HOSPITAL COURSE:  The patient was admitted.  He was treated with Lovenox.  He was placed on Protonix.  He subsequently ruled out for a myocardial  infarction by serial cardiac enzymes.  It was noted that his renal  insufficiency was new.  He was gently hydrated and this improved with BUN  declining to 31 and creatinine 1.3.  His potassium declined to 5.0.  It was  felt that his acute  renal insufficiency and chest pain were probably related  to his recent course of sulfa drugs, although there may have been a  component of dehydration.  At any rate, he had no further chest pain during  his hospital stay and was discharged home in stable condition on November 25, 2003.   The patient's blood pressure was relatively low during his hospital stay.  We recommended reducing his Lotrel to 1/2 tablet daily.   DISCHARGE DIAGNOSES:  1. Chest pain:  Myocardial infarction ruled out.  2. Renal insufficiency, acute:  Improved.  3. Arteriosclerotic coronary artery disease, status post remote coronary     artery bypass graft:  Negative cardiac catheterization March of 2004.  4. Hyperkalemia:  Improved.   DISCHARGE MEDICATIONS:  1. Lipitor  20 mg q.d.  2. Lotrel 5/20 mg 1/2 tablet q.d.  3. Aspirin q.d.   DIET:  The patient is to continue on low fat diet.   FOLLOW UP:  Will follow up Dr. Peter M. Swaziland in two weeks with lab work  including CMET, lipid panel.  Will also check blood pressure at that time.   CONDITION ON DISCHARGE:  Improved.                                                Peter M. Swaziland, M.D.    PMJ/MEDQ  D:  12/26/2003  T:  12/26/2003  Job:  [id number]   cc:   Gabriel Earing, M.D.  [address]  [place]  [place] [address]  Fax: [phone]

## 2010-09-17 NOTE — Discharge Summary (Signed)
NAMEJEVIN, [name]               ACCOUNT NO.:  [id number]   MEDICAL RECORD NO.:  [id number]          PATIENT TYPE:  INP   LOCATION:  5710                         FACILITY:  [facility]   PHYSICIAN:  Currie Paris, M.D.DATE OF BIRTH:  1924-11-30   DATE OF ADMISSION:  11/01/2004  DATE OF DISCHARGE:  11/06/2004                                 DISCHARGE SUMMARY   FINAL DIAGNOSES:  Acute appendicitis.   CLINICAL HISTORY:  [name] was admitted on July 3 with right lower  quadrant pain for several days. He had signs, symptoms and a CT showing  appendicitis.   HOSPITAL COURSE:  The patient admitted, taken to the operating room where  appendectomy was performed. He had a perforated appendicitis. It was done  laparoscopically. Postoperatively he had a somewhat slow recovery period due  to his peritonitis from his perforated appendicitis, but he eventually  resolved and was able to be discharged. He is sent home in satisfactory  condition to be followed up in our office in approximately 2 weeks. Final  pathology confirmed acute suppurative appendicitis with perforation.      Currie Paris, M.D.  Electronically Signed     CJS/MEDQ  D:  02/23/2005  T:  02/24/2005  Job:  [id number]

## 2010-09-17 NOTE — H&P (Signed)
[facility]  Patient:    [name], [name] Visit Number: [id number] MRN: [id number]          Service Type: Attending:  Vonzell Schlatter. Patsi Sears, M.D. Dictated by:   Vonzell Schlatter Patsi Sears, M.D. Adm. Date:  08/02/01                           History and Physical  BRIEF HISTORY:  The patient is a 77 year old married white male, who underwent AMS 700CX prosthesis placement in [place] in 1989.  The prosthesis worked well, except that the patient complained of pain and the pain since his original surgery.  In 1993 the patient developed a rupture of the left corporal cylinder and the prosthesis was replaced with a new AMS 700CX expandable prosthesis.  However, the patients penile pain continued and he is noted to have pain in the glands and occasional pain in the very proximal portion of the perineum.  The pain was actually worse with D2 ______ then it was with 2 ______.  The patient noted that pain kept him from enjoying sexual activity and he experienced delayed ejaculation and sexual activity.  He was able to ejaculate, however, and occasionally to masturbate.  In addition the patient was tried on Muse therapy in July but noted that it did not do away with the pain that he had in his penis.  In October 1997, the patient underwent replacement of his prosthesis but this was removed in 1998 because of chronic pain. The patient desires to have another prosthesis placed.  PAST MEDICAL HISTORY: 1. Significant for 2.9 x 3.1 x 2.9 cm complex mass in the pole of the left    kidney, felt unlikely represent any malignancy.  He was followed with CT    scans, and these were stable at 3 month intervals. 2. In addition the past medical history is significant for meningitis in the    1950s, and also in the 1960s while working in a [employer], felt    secondary to mice or rat exposure.  He had a third of meningitis in 1990,    with preceding symptoms of a headache and  recurrent urinary tract    infections. 3. In addition the patient has a history of hepatitis in 1956. 4. Vasectomy 5. Ganglion cyst surgery 6. Hemorrhoidectomy 7. Bilateral inguinal hernia repair 8. Penile implants (see above).  ALLERGIES: 1. PENICILLIN. 2. CODEINE.  PHYSICAL EXAMINATION:  GENERAL:  On admission the physical examination shows a well-developed white male in no acute distress.  MEDICATIONS: 1. Lotrel 1 tablet per day. 2. Lipitor 10 mg a day. 3. Aspirin (off since July 25, 2001) 4. Ibuprofen.  VITAL SIGNS:  Temperature 96.7, heart rate 64, respiratory rate 16, blood pressure 132/60.  HEENT:  Pupils, equal, round, reactive to light and accommodation.  EOM full.  NECK:  Supple, nontender, no nodes.  CHEST:  Clear to percussion and auscultation.  ABDOMEN:  Soft, positive bowel sounds without organomegaly or masses.  There are well healed multiple abdominal incisions.  GENITOURINARY:  The penis is normal and the urethra is normal.  There is good glands present.  These are well healed surgical incisions from prior prosthesis surgery.  The scrotum is normal, and the testicles are descended bilaterally.  RECTAL:  Shows normal sphincter tone, a smooth a flat prosthetic fossa.  No masses.  No blood.  EXTREMITIES:  Without cyanosis or edema.  NEUROLOGIC:  Physiologic.  ADMITTING IMPRESSION:  Organic sexual dysfunction.  PLAN:  Implantation of penile prosthesis. Dictated by:   Vonzell Schlatter Patsi Sears, M.D. Attending:  Vonzell Schlatter. Patsi Sears, M.D. DD:  08/02/01 TD:  08/02/01 Job: [id number]

## 2010-09-17 NOTE — H&P (Signed)
NAME:  [name]                         ACCOUNT NO.:  [id number]   MEDICAL RECORD NO.:  [id number]                   PATIENT TYPE:  OBV   LOCATION:  1844                                 FACILITY:  [facility]   PHYSICIAN:  Quita Skye. Waldon Reining, MD             DATE OF BIRTH:  [birth year]   DATE OF ADMISSION:  12/24/2003  DATE OF DISCHARGE:                                HISTORY & PHYSICAL   [name] is a 77 year old white man who is admitted to [facility] for further evaluation of chest pain.   The patient has a history of coronary artery disease which dates back to  19.  At that time he presented with unstable angina.  He underwent two  angioplasties of the right coronary artery.  These subsequently failed and  he underwent coronary artery bypass surgery in 1993.  In January 1993 he  received a left internal mammary artery graft to the obtuse marginal branch  and a saphenous vein graft to the right coronary artery.  He did well over  the ensuing years with mild stable angina.  In March 2004 he was readmitted  for chest pain.  Cardiac catheterization demonstrated a normal left main, an  LAD with minor luminal irregularities, moderate sized intermediate branch  with a 40% lesion proximally, a circumflex which was totally occluded in its  midsection, and a right coronary artery which was occluded in the proximal  to midportion.  The saphenous vein graft to the distal right coronary artery  was widely patent.  There was a 30-40% narrowing at the insertion site in  the greater vessel.  The graft gave excellent flow to both the TDA and  posterolateral branches.  The left internal mammary artery graft was widely  patent and fills the marginal branch.  Left ventricular systolic function  was normal with an ejection fraction estimated to be approximately 50-55%.  Continued medical therapy was recommended.   The patient presented to the emergency department this evening with a  7-10  day history of intermittent chest pain.  The chest pain is described as a  pressure in his left anterior chest.  Episodes occur in random and appear  not to be related to position, activity, meals, or respiration.  They last  several hours at a time and resolve spontaneously.  He has experienced  episodes of chest pain nearly every day for the last 7-10 days.  The chest  pain is associated with mild dyspnea and diaphoresis, but no nausea.  There  were no exacerbating or ameliorating factors.  Several nitroglycerin tablets  taken over the course of a week have not seemed to affect the chest pain.  He is free of chest pain at this time.  He feels that the chest pain is  similar to his prior cardiac chest pain.   There is no history of congestive heart failure or arrythmia.   In  addition to the cardiac disease noted above, the patient has a history of  hypertension and hyperlipidemia.  There is no history of diabetes mellitus.  He discontinued smoking over 20 years ago.  His mother suffered a myocardial  infarction, but at a very advanced age.   Other medical problems include cerebrovascular disease, a history of  meningitis, and a remote history of peptic ulcer disease.   The patient's current medications include aspirin 325 mg p.o. daily, Lotrel,  and Lipitor.   ALLERGIES:  PENICILLIN AND CODEINE.   PREVIOUS OPERATIONS:  Bilateral inguinal hernia repair, hemorrhoidectomy,  tonsillectomy and adenoidectomy, and penile implant.   SIGNIFICANT INJURIES:  None.   The patient is retired from the [employer].  He lives with his wife.  His children are gowns.  He does not drink alcohol.   FAMILY HISTORY:  His father died in his 42s of intestinal cancer.  A brother  died at a young age of polio.  His mother died of a myocardial infarction at  age 20.   REVIEW OF SYSTEMS:  Reveals no new problems related to his head, eyes, ears,  nose, mouth, throat, lungs, gastrointestinal  system, genitourinary system,  or extremities.  There is no history of neurologic or psychiatric disorder.  There is no history of fever, chills, or weight loss.   PHYSICAL EXAMINATION:  Blood pressure 110/60.  Pulse 76 and regular.  Respirations 20.  Temperature 97.0.  The patient was an elderly white man in  no discomfort.  He was alert, oriented, appropriate, and responsive.  HEAD/EYES/NOSE/MOUTH:  Normal.  NECK:  Without thyromegaly or adenopathy.  Carotid pulses were palpable  bilaterally and without bruits.  CARDIAC:  Examination revealed a normal S1 and S2.  There was no S3, S4,  murmur, rub, or click.  Cardiac rhythm was regular.  No chest wall  tenderness was noted.  LUNGS:  Clear.  ABDOMEN:  Soft and nontender.  There was no mass, hepatosplenomegaly, bruit,  distention, rebound, guarding, or rigidity.  Bowel sounds were normal.  Rectal and genital examinations were not performed as they were not  pertinent to the reason for acute care hospitalization.  EXTREMITIES:  Without edema, deviation, or deformity.  Radial and dorsalis  pedal pulses were palpable bilaterally.  Brief screening neurologic survey  was unremarkable.   The electrocardiogram was normal.  The chest radiograph, according to the  radiologist, demonstrated chronic changes consistent with history of  asbestos exposure.  The initial set of cardiac markers revealed a CK-MB 1.9,  myoglobin 110, and troponin of less than 0.05.  The second set revealed a CK-  MB of 1.8, myoglobin 97.0, and troponin of less than 0.05.  The third set  revealed a CK-MB of 1.9, myoglobin 105, and troponin of less than 0.05.  The  potassium was 5.8, BUN 43, and creatinine 1.7.  White count was 6.3 with a  hemoglobin of 13.1 and hematocrit of 37.8.  The remaining studies were  pending at the time of this dictation.   IMPRESSION:  1. Chest pain, rule out unstable angina. 2. Coronary artery disease.  Status post percutaneous transluminal  coronary     angioplasties of right coronary artery in 1992.  Status post coronary     artery bypass in 1993, most recent catheterization was in March of 2004.     Details as described above.  3. Hypertension.  4. Dyslipidemia.  5. Cerebrovascular disease.  6. Mild renal insufficiency.  7. History of  meningitis.  8. Remote history of peptic ulcer disease.   PLAN:  1. Telemetry.  2. Serial cardiac enzymes.  3. Aspirin.  4. Lovenox.  5. Intravenous nitroglycerin.  6. Recheck potassium.  7. Further measures per Dr. Swaziland.                                                Quita Skye. Waldon Reining, MD    MSC/MEDQ  D:  12/24/2003  T:  12/25/2003  Job:  [id number]   cc:   Peter M. Swaziland, M.D.  [address]  [place], [place] [address]  Fax: [phone]

## 2010-09-17 NOTE — H&P (Signed)
NAMECAMDIN, [name]               ACCOUNT NO.:  [id number]   MEDICAL RECORD NO.:  [id number]          PATIENT TYPE:  EMS   LOCATION:  MAJO                         FACILITY:  [facility]   PHYSICIAN:  Ollen Gross. Vernell Morgans, M.D. DATE OF BIRTH:  01/17/1925   DATE OF ADMISSION:  10/31/2004  DATE OF DISCHARGE:                                HISTORY & PHYSICAL   [name] is a 77 year old white male who presents with right lower  quadrant pain since last Thursday.  He has felt as though he has had some  fevers at home.  He has also had some nausea but no vomiting.  His pain  never resolved and has gotten a little bit worse.  He denies any dysuria,  diarrhea, chest pain, or shortness of breath.  His other review of systems  is unremarkable.   PAST MEDICAL HISTORY:  1.  Coronary artery disease.  2.  Hypertension.  3.  Erectile dysfunction.   PAST SURGICAL HISTORY:  1.  Coronary artery bypass grafting.  2.  Multiple placements of penile prostheses.   MEDICATIONS:  Lotrel.   ALLERGIES:  1.  CODEINE.  2.  PENICILLIN.   SOCIAL HISTORY:  Denies the use of alcohol or tobacco products.   FAMILY HISTORY:  Noncontributory.   PHYSICAL EXAMINATION:  VITAL SIGNS:  Temperature 98.2; blood pressure 94/54;  pulse 84.  GENERAL:  He is a well-developed, well-nourished elderly white male in no  acute distress.  SKIN:  Warm and dry, with no jaundice.  HEENT:  Eyes:  His extraocular muscles are intact.  Pupils equal, round, and  reactive to light.  Sclerae anicteric.  LUNGS:  Clear bilaterally, with no use of accessory respiratory muscles.  HEART:  Regular rate and rhythm, with an impulse in the left chest.  ABDOMEN:  Soft, with focal right lower quadrant pain with guarding but no  peritonitis.  No palpable mass or hepatosplenomegaly.  EXTREMITIES:  No cyanosis, clubbing, or edema, with good strength in his  arms and legs.  PSYCHOLOGICAL:  He is alert and oriented x 3, with no evidence today of  anxiety or depression.   On review of his lab work, it was significant for white count 11.8, as well  as a creatinine of 2.1, BUN 53.  On reviewing his CT scan with the  radiologist, it was significant for an enlarged inflamed appendix, with a  small abscess associated with it.   ASSESSMENT AND PLAN:  This is a 77 year old white male with acute  appendicitis and a small abscess associated with this, consistent with a  perforation.  I recommended an appendectomy tonight.  I think there is a  chance that we could do this laparoscopically and drain the abscess as well.  I have explained to him in detail the risks and benefits of the operation as  well as some of the technical aspects, and he understands and wished to  proceed.  We will contact the operating room and start him on some Cipro and  Flagyl and plan to do this for him tonight.  PST/MEDQ  D:  11/01/2004  T:  11/01/2004  Job:  [id number]

## 2010-09-17 NOTE — Op Note (Signed)
[facility]  Patient:    [name], [name] Visit Number: [id number] MRN: [id number]          Service Type: Attending:  Vonzell Schlatter. Patsi Sears, M.D. Dictated by:   Vonzell Schlatter Patsi Sears, M.D. Proc. Date: 08/02/01                             Operative Report  PREOPERATIVE DIAGNOSIS:  Organic sexual dysfunction.  POSTOPERATIVE DIAGNOSIS:  Organic sexual dysfunction.  OPERATION PERFORMED:  Implantation of mentor titan 14 cm penile prosthesis (2 cm rear tip) inflatable penile prosthesis.  SURGEON:  Sigmund I. Patsi Sears, M.D.  ASSISTANT:  Lindaann Slough, M.D.  PREPARATION:  After appropriate preanesthesia, the patient is brought to the operating room, placed on the operating table in dorsal supine position. He was covered with IV antibiotics, and the penis was washed with antibiotic soap by the patient preoperatively and then washed with Betadine solution, prepped with Betadine solution and draped in the usual fashion.  REVIEW OF HISTORY:  The patient is status post placement of an AMS penile prosthesis in 1989, complicated by chronic pain. In 1993, the cylinders were replaced and the pain continued despite replacement. The patient was unable to achieve full erection with the prosthesis and failed additional ______ therapy. He had re-exploration, with removal of the cylinders, and placement of a mentor three piece prosthesis in January of 1998. The patient then underwent Viagra therapy to get additional gland size, but continued to have pain with erections. The patient had the prosthesis removed in October of 1998 and has had no erection since that time. He now would like to have one more chance at erection, and therefore, we are going to attempt to place a mentor prosthesis for him.  DESCRIPTION OF PROCEDURE:   The wound is prepped and draped in the usual fashion. The infrapubic wound is excised, and subcutaneous tissue dissected. The corpora  cavernosa is bilaterally defined, and two separate traction sutures are placed in the corpora cavernosa. The corpora are then dissected, and much scarring was noted. The procedure took approximately 3 hours and 45 minutes, because of tedious dissection both proximal and distal in the corpora. Great care was taken to avoid injury to both the glans, and the urethra. It was felt that dissection was posterior to the pubic bone, it was therefore elected to place a piece of tutoplast fascia in each of the corporal cavernosal cylinders. The cylinders measured 14 cm, and a mentor 14 cm prosthesis with 2 cm rear tip extension was placed. It was felt that the left corpora cavernosal placement was difficult, this was redilated, and I could not see the left proximal rear tip extender. An x-ray will be taken at the end of the case to see where the proximal rear tip is. The fascia is replaced in the corpora cavernosa, in order to scar in the cavernosa proximally. The cylinders were placed on the pubic bone, and cycled. The prosthesis appeared to be in good position, with good erection. The reservoir was placed by making incision in the rectus fascia and dissection with blunt and sharp dissection. A 70 cc reservoir was placed and 70 cc were placed in the reservoir and it was left during the case in order to help define the pocket which it would be in. The fascia was closed with 2-0 Vicryl suture. Following this, the pump was placed, and the pump was connected to the reservoir. The cycling of the  prosthesis was again accomplished and again looked to be a good erection. The cylinders were closed with individual 2-0 Vicryl sutures, and following this, a 4 cm x 7 cm portion of tutoplast fascia was then placed over the tubing, to afford good coverage subcutaneously. The skin was then closed in two layers with 2-0 Vicryl suture, as well as skin staples. The patient was awakened and taken to the recovery room in  good condition. Dictated by:   Vonzell Schlatter Patsi Sears, M.D. Attending:  Vonzell Schlatter. Patsi Sears, M.D. DD:  08/02/01 TD:  08/02/01 Job: [id number]

## 2010-09-17 NOTE — Op Note (Signed)
[name], [name]               ACCOUNT NO.:  [id number]   MEDICAL RECORD NO.:  [id number]          PATIENT TYPE:  OIB   LOCATION:  1431                         FACILITY:  [facility]   PHYSICIAN:  Sigmund I. Patsi Sears, M.D.DATE OF BIRTH:  1925-02-27   DATE OF PROCEDURE:  DATE OF DISCHARGE:                                 OPERATIVE REPORT   PREOPERATIVE DIAGNOSES:  1.  Erectile dysfunction.  2.  Malfunctioning penile prosthesis.   POSTOPERATIVE DIAGNOSES:  1.  Erectile dysfunction.  2.  Malfunctioning penile prosthesis.   PROCEDURE:  1.  Removal of penile prosthesis.  2.  Implantation of penile prosthesis.   PROCEDURE:  The patient was identified by his wrist bracelet and brought to  room #10, where he received preoperative antibiotics and administered  general anesthesia.  Prepped and draped in the usual sterile fashion.  With  an elliptical incision, we incised and discarded his previously placed  transverse scar, which was infrapubic in nature.  We dissected down to the  dermis and subcutaneous fat.  We encountered the tubing from his previously  placed penile prosthesis.  We dissected this off of the encapsulated fat  using Bovie cautery.  We followed the tubing down to the reservoir with the  fascia overlying.  We divided with Bovie cautery and dissected the  pseudocapsule off of the pump using Bovie cautery.  The tubing was then  divided, and the pump was passed off of the field.  The pump site was  subsequently irrigated with antibiotic-infused solution.  A #65 cc reservoir  pump was placed.  It was inflated with 65 cc of sterile water, otherwise  slight back pressure to approximately 58 cc.  Next, the fascia was closed  with a running 2-0 Vicryl.  Next, I infused the reservoir with 65 cc of  sterile water.  There was slight back pressure to 58 cc, and the end of the  tubing was shotted.  Next, we followed the other end of the tubing down to  the scrotal pump.  Again, the  pseudocast around the pump tubing was opened  with Bovie cautery, and we easily delivered the pump from the scrotum.  Next, we followed the tubing down to each corporal cylinder.  Corporotomies  were made.  With this pump tubing, entered the corpora.  The corporal bodies  were easily removed.  The right corporal body was noted to have an aneurysm  in the device.  Following this, using Mentor dilator, we dilated each  corporal body, both proximally and distally from 9 until 13 [place].  Next,  we sized each corporal body both proximally and distally and found both  corpora had an approximate length of 16 cm.  Next, we copiously irrigated  our corporal bodies as well as the scrotal channel, and we placed our penile  pump device left in the dependent portion of the scrotum very superficially  below the dartos fascia.  This tubing was brought out the right side of the  incision.  Following this, we touched the needle to the introducer device,  placed it in the distal  aspect of the left corporal body, fired it through  the glans penis, and brought the introducer string through the glans penis.  The right corporal device was then placed both distally.  The right corporal  device was then fitted with a 2 cm rear tip extender.  It was placed in the  proximal corpora without any buckling.  This process was repeated on the  left side; however, after some size adjustments, we found a 1 cm rear tip  produced the best fit on the left.  Next, the corporotomies were then closed  with running 2-0 Vicryl, being careful not to involve the underlying penile  cylinder in the closure.  The protective bodies over the tubing were then  cut and discarded.  Following this, we trimmed the tubing to the reservoir  and the pump device.  They were connected with a straight connector, being  careful to make sure there was no air in the connection.  Following this,  the wound was found to be hemostatic.  We then closed the  fascia for the  incision in two layers with a running 2-0 Vicryl in the deep fascial layer  and a running 4-0 subcuticular Monocryl on the level of the skin.  Dressings  were applied.  The Foley catheter was attached to a drainage bag.  The  patient was reversed from his anesthesia, which he tolerated without  complication.   Dr. Jethro Bolus was present and participated in all aspects of this  case.     ______________________________  Glade Nurse, MD      Sigmund I. Patsi Sears, M.D.  Electronically Signed    MT/MEDQ  D:  01/06/2005  T:  01/06/2005  Job:  [id number]

## 2010-09-17 NOTE — H&P (Signed)
NAME:  [name], [name] NO.:  [id number]   MEDICAL RECORD NO.:  [id number]                   PATIENT TYPE:  OIB   LOCATION:                                       FACILITY:  [facility]   PHYSICIAN:  Peter M. Swaziland, M.D.               DATE OF BIRTH:  [birth year]   DATE OF ADMISSION:  07/25/2002  DATE OF DISCHARGE:                                HISTORY & PHYSICAL   HISTORY OF PRESENT ILLNESS:  The patient is a 77 year old white male with a  history of coronary artery disease, status post coronary artery bypass  surgery in January of 1993.  He now presents with worsening anginal  symptoms.  The patient initially presented in 1992.  He had two subsequent  angioplasties to the right coronary artery.  This subsequently failed and he  underwent coronary artery bypass surgery x 2 in January of 1993 by Gwenith Daily.  Tyrone Sage, M.D.  This included an LIMA graft to the obtuse marginal branch  and a saphenous vein graft to the right coronary artery.  He has done well  since then with some mild chronic anginal symptoms.  His last stress  Cardiolite study in March of 2002 reproduced some anginal symptoms at 6  minutes, but his Cardiolite scans were normal and his ejection fraction was  62%.  Over the past two months, he noticed that they symptoms have been more  frequent and more severe.  They still occur with exertion and are relieved  with rest.  His anginal symptoms include pain under both arms.  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  He complains that he has had to slow  down a lot because of his symptoms.  He is now admitted for follow-up  cardiac catheterization.   PAST MEDICAL HISTORY:  1. ASCAD, status post PTCA of the right coronary artery x 2, status post     CABG x 2 in 1993.  2. Hypertension.  3. Hypercholesterolemia.  4. History of dizziness with evaluation of carotid Doppler studies in     September of 2003 showing 40-60% left ICA stenosis.  A  cranial CT scan     was negative.  5. History of meningitis.  6. Status post bilateral inguinal hernia repair.  7. Status post hemorrhoidectomy.  8. Status post T&A.  9. Status post penial implant.  10.      Remote history of peptic ulcer disease.   ALLERGIES:  PENICILLIN and CODEINE.   CURRENT MEDICATIONS:  1. Aspirin 325 mg daily.  2. Lotrel 5/20 mg per day.  3. Lipitor 20 mg per day.  4. Ibuprofen 400 mg b.i.d.   SOCIAL HISTORY:  The patient is retired from the [employer] of [employer].  He  works part-time as a Electrical engineer.  He quit smoking in the early 1980s.  He denies alcohol use.  He is  married and has three children.   FAMILY HISTORY:  His father died at age 55 with cancer of the intestines.  One brother died of polio.   REVIEW OF SYSTEMS:  As noted in the HPI, otherwise negative.   PHYSICAL EXAMINATION:  GENERAL APPEARANCE:  The patient is a pleasant white  male in no acute distress.  VITAL SIGNS:  The blood pressure is 164/80, the pulse is 70 and regular, and  respirations are 20.  WEIGHT:  176 pounds.  HEENT:  Pupils equal, round, and reactive to light and accomodation.  The  conjunctivae are clear.  The patient does wear glasses.  The oropharynx is  clear.  NECK:  Without JVD, adenopathy, thyromegaly, or bruits.  LUNGS:  Clear.  CARDIAC:  Regular rate and rhythm.  There are no murmurs, rubs, gallops, or  clicks.  ABDOMEN:  Soft and nontender.  There is no hepatosplenomegaly, masses, or  bruits.  EXTREMITIES:  Femoral and pedal pulses are 2+ and symmetric.  There is no  edema or cyanosis.  GENITOURINARY:  The patient does have a penial implant.  NEUROLOGIC:  Nonfocal.   LABORATORY DATA:  The ECG is normal.  The chest x-ray shows evidence of his  previous bypass surgery with no active disease.   IMPRESSION:  1. Increasing exertional angina.  2. Status post coronary artery bypass graft in 1993.  3. Hypertension.  4. Hypercholesterolemia.   PLAN:  The  patient will be admitted for cardiac catheterization with  possible intervention pending the results.                                               Peter M. Swaziland, M.D.    PMJ/MEDQ  D:  07/18/2002  T:  07/19/2002  Job:  [id number]   cc:   Windle Guard, M.D.  [address]  [place], [place] [address]  Fax: [phone]

## 2010-09-17 NOTE — Op Note (Signed)
NAMEELIO, [name]               ACCOUNT NO.:  [id number]   MEDICAL RECORD NO.:  [id number]          PATIENT TYPE:  INP   LOCATION:  5710                         FACILITY:  [facility]   PHYSICIAN:  Ollen Gross. Vernell Morgans, M.D. DATE OF BIRTH:  11-27-1924   DATE OF PROCEDURE:  11/01/2004  DATE OF DISCHARGE:                                 OPERATIVE REPORT   PREOPERATIVE DIAGNOSIS:  Perforated appendicitis.   POSTOPERATIVE DIAGNOSIS:  Perforated appendicitis.   PROCEDURE:  Laparoscopic appendectomy.   SURGEON:  Ollen Gross. Carolynne Edouard, M.D.   ANESTHESIA:  General endotracheal.   DESCRIPTION OF PROCEDURE:  After informed consent was obtained, the patient  was brought to the operating room, placed in a supine position on the  operating room table. After adequate induction of general anesthesia, the  patient's abdomen  was prepped with Betadine and draped in the usual sterile  manner. The area above the umbilicus was infiltrated with 0.25% Marcaine. A  small incision was made with a 15 blade knife. This incision was carried  down through the subcutaneous tissue bluntly with a hemostat and [employer]  retractors until the linea alba was identified. The linea alba was incised  with a 15 blade knife. Each side was grasped with Kocher clamps and elevated  anteriorly. The preperitoneal space was then probed bluntly with a hemostat  until the peritoneum was opened and access was gained to the abdominal  cavity. A #0 Vicryl pursestring stitch was placed on the fascia surrounding  the opening, Hasson cannula was placed through the opening and anchored in  place with the previous placed Vicryl pursestring stitch. The abdomen was  then insufflated with carbon dioxide without difficulty. The patient was  placed in Trendelenburg position, rotated with the right side. Next in the  left lower quadrant, an area away from his balloon for his penile prosthesis  was infiltrated with 0.25% Marcaine and a small incision was  made with a 15  blade knife and a 10 mm port was placed bluntly through this incision into  the abdominal cavity under direct vision. Through an epigastric area,  another area was infiltrated with 0.25% Marcaine, a small stab incision was  made with a 15 blade knife and a 5 mm port was placed bluntly through this  incision into the abdominal cavity under direct vision. The camera was then  moved to the left lower quadrant port using a Glassman grasper and a  Harmonic scalpel. The right lower quadrant was inspected. The cecum was  mobilized by incising the retroperitoneal attachment along the white line of  Toldt. The appendix was retrocecal and the small abscess cavity was  contained in that area. The appendix was very densely stuck in the  retroperitoneum but the base of the appendix at its junction with the cecum  was easy to identify  and was dissected and blunt dissection was carried out  at the base of the cecum such that the appendix was dissected  circumferentially at its junction with the cecum without difficulty. A  laparoscopic GIA 45 stapler with a blue load was placed across  the base of  the appendix at its junction with the cecum, clamped and fired thereby  dividing the base of the appendix between staple lines. The appendix was  then separated from the rest of the inflamed retroperitoneum by a  combination of blunt dissection and sharp dissection with the harmonic  scalpel. Once the entire appendix had been separated from the rest of the  inflamed tissue,  laparoscopic bag was placed through the Hasson cannula and  the appendix was placed within the bag and the bag was sealed. The abdomen  was then irrigated copious amounts of saline until the effluent was clear.  The staple line at the cecum was examined again and looked healthy and  intact. Next a 19-French round Harrison Mons drain was brought through the left  lower quadrant port and out the opening where the 5 mm port was. This  drain  was placed in the right gutter and down into the pelvis. This drain was  anchored to the skin using a 3-0 nylon stitch. The rest of the abdomen was  inspected and no other abnormalities were noted. The supraumbilical port was  then removed without difficulty and the fascial defect was closed with the  previously placed Vicryl pursestring stitch as well as with another  interrupted #0 Vicryl stitch. The gas was allowed to escape and the left  lower quadrant port was removed and found to be hemostatic. The skin  incisions were closed with staples and sterile dressings were applied. The  patient tolerated the procedure well. At the end of the case, all needle,  sponge and instrument counts were correct. The patient was then awakened and  taken to recovery room in stable condition.       PST/MEDQ  D:  11/03/2004  T:  11/03/2004  Job:  [id number]

## 2010-09-17 NOTE — H&P (Signed)
[name], [name]               ACCOUNT NO.:  [id number]   MEDICAL RECORD NO.:  [id number]          PATIENT TYPE:  OIB   LOCATION:  1431                         FACILITY:  [facility]   PHYSICIAN:  Sigmund I. Patsi Sears, M.D.DATE OF BIRTH:  20-Apr-1925   DATE OF ADMISSION:  01/06/2005  DATE OF DISCHARGE:                                HISTORY & PHYSICAL   CHIEF COMPLAINT:  Erectile dysfunction.   BRIEF MEDICAL HISTORY:  This patient is a 77 year old married male who  underwent AMS 700CX prosthesis placement in [place] in 1989.  The  prosthesis worked well, but unfortunately, the left corporal cylinder  ruptured in 1993 and was replaced with a new AMS 700CX.  However, he  continued to have pain with this and subsequently in 2003, his prosthesis  was replaced with a Mentor inflatable prosthesis.  This worked well for him  until recently when he has not been able to expand the inflatable cylinders.   PAST MEDICAL HISTORY:  1.  Complex left lower pole cyst.  2.  Meningitis in the 1950s.  3.  Hepatitis.  4.  Vasectomy.  5.  Ganglion cyst surgery.  6.  Hemorrhoidectomy.  7.  Bilateral inguinal hernia repair.  8.  Hyperlipidemia.  9.  Hypertension.   ALLERGIES:  1.  PENICILLIN.  2.  CODEINE.   MEDICATIONS:  1.  Lotrel.  2.  Vytorin.  3.  Aspirin, held since December 30, 2004.  4.  Nitroglycerin p.r.n.   PHYSICAL EXAMINATION:  VITAL SIGNS:  Temperature 97.6, heart rate 80,  respiratory rate 12, blood pressure 123/60.  HEENT:  Pupils are equal, round and reactive to light and accommodation.  EOMs are full.  NECK:  Supple, nontender.  No nodes.  CHEST:  Clear to auscultation.  ABDOMEN:  Soft, nontender, nondistended, without organomegaly or masses.  Several well healed abdominal incisions.  GU:  Normal penile shaft with normal urethra.  Well healed surgical  incisions from prior prostatic surgeries.  Scrotum is normal.  Pump in  dependent portion of scrotum.  Testicles are  descended bilaterally.  There  are no contour defects.  EXTREMITIES:  Without cyanosis or edema.  NEUROLOGIC:  Physiologic.   ADMISSION IMPRESSION:  Organic sexual dysfunction with malfunction of penile  prosthesis.   PLAN:  Implantation of penile prosthesis after removal of malfunctioning  penile prosthesis.     ______________________________  Glade Nurse, MD      Sigmund I. Patsi Sears, M.D.  Electronically Signed    MT/MEDQ  D:  01/06/2005  T:  01/06/2005  Job:  [id number]

## 2010-09-17 NOTE — Cardiovascular Report (Signed)
NAME:  [name], [name] NO.:  [id number]   MEDICAL RECORD NO.:  [id number]                   PATIENT TYPE:  OIB   LOCATION:  NA                                   FACILITY:  [facility]   PHYSICIAN:  Peter M. Swaziland, M.D.               DATE OF BIRTH:  1924-09-24   DATE OF PROCEDURE:  07/25/2002  DATE OF DISCHARGE:                              CARDIAC CATHETERIZATION   INDICATIONS FOR PROCEDURE:  A 77 year old white male with a history of  coronary artery disease, status post CABG in 1993.  He presents with some  increased anginal symptoms.   ACCESS:  Via the right femoral artery using the standard Seldinger  technique.   EQUIPMENT:  A 6-French 3.5-cm left Judkins catheter, a 4-cm right Judkins  catheter, LIMA catheter, 6-French pigtail catheter, 6-French arterial  sheath.   MEDICATIONS:  Local anesthesia with 1% Xylocaine.   CONTRAST:  175 mL of Omnipaque.   HEMODYNAMIC DATA:  1. Aortic pressure was 113/45 with a mean of 75.  2. Left ventricular pressure was 115 with an EDP of 15 mmHg.   ANGIOGRAPHIC DATA:  The left coronary artery arises and distributes  normally.  The left main coronary artery is long and appears normal.   The left anterior descending artery is widely patent throughout with only  minimal irregularities, less than 10%.  There is a moderate-sized  intermediate branch which has 40% narrowing proximally.   The left circumflex coronary artery is occluded in the mid vessel.   The right coronary artery is occluded in the proximal to mid vessel.   The saphenous vein graft to the distal right coronary artery is widely  patent.  There is 30%  to 40% narrowing at the insertion site in the native  vessel.  This gives excellent flow to both PDA and posterolateral branches.   The LIMA graft is widely patent, filling the marginal branch.   LEFT VENTRICULOGRAPHY:  Left ventriculography was performed in the RAO view.  This demonstrates  normal left ventricular size and contractility with normal  systolic function.  Ejection fraction is estimated at 65%.   FINAL INTERPRETATION:  1. Two-vessel obstructive atherosclerotic coronary artery disease.  2.     Patent saphenous vein graft to the distal right coronary artery.  3. Patent left internal mammary artery graft to the obtuse marginal branch.  4. Normal left ventricular function.                                                 Peter M. Swaziland, M.D.    PMJ/MEDQ  D:  07/25/2002  T:  07/27/2002  Job:  [id number]   cc:   Windle Guard, M.D.  [address]  [place], [place] [address]  Fax:  [phone]  

## 2010-09-17 NOTE — Op Note (Signed)
NAME:  [name], [name]                         ACCOUNT NO.:  [id number]   MEDICAL RECORD NO.:  [id number]                   PATIENT TYPE:  AMB   LOCATION:  ENDO                                 FACILITY:  [facility]   PHYSICIAN:  Graylin Shiver, M.D.                DATE OF BIRTH:  [birth year]   DATE OF PROCEDURE:  11/13/2002  DATE OF DISCHARGE:                                 OPERATIVE REPORT   PROCEDURE PERFORMED:  Esophagogastroduodenoscopy with endoscopic balloon  dilation of a Schatzki's ring and biopsy for CLO test.   ENDOSCOPIST:  Graylin Shiver, M.D.   INDICATIONS FOR PROCEDURE:  The patient is a 77 year old male who has been  experiencing dysphagia both to solids and to liquids.  A barium swallow was  done which revealed a small hiatal hernia and a mild stricture of the distal  esophagus.  There was also esophageal dysmotility.  Endoscopy is being done  to evaluate.   PREMEDICATIONS:  Fentanyl 50 mcg IV, Versed 5 mg IV.   Informed consent was obtained after explanation of the risks of bleeding,  infection and perforation with possible need for surgery.   DESCRIPTION OF PROCEDURE:  With the patient in the left lateral decubitus  position the Olympus gastroscope was inserted into the oropharynx and passed  into the esophagus.  It was advanced down the esophagus and into the stomach  and into the duodenum.  The second portion of the duodenum was normal.  The  bulb of the duodenum revealed duodenitis.  The stomach revealed gastritis  with scattered erosions in the antrum.  The body of the stomach looked  normal.  The fundus and cardia had normal-appearing mucosa.  There was a  hiatal hernia.  A biopsy was obtained from the distal stomach for CLO test.  The scope was then brought up into the esophagus.  A hiatal hernia was seen.  There was a Schatzki's ring at 38 cm.  Because of the patient's complaint of  dysphagia, it was elected to proceed with dilation of the Schatzki's ring.  An 18 mm endoscopic balloon dilator was advanced down the scope and  appropriately placed at the level of the Schatzki's ring.  It was slowly  inflated to 18 mm and held in place for one minute.  It was then deflated  and removed. Thee was some heme at the region of the dilation.  The rest of  the esophagus looked normal.  The patient tolerated the procedure well  without complications.   IMPRESSION:  1. Schatzki's ring which was dilated to 18 mm.  2. Hiatal hernia.  3. Erosive antral gastritis.  4. Duodenitis.   PLAN:  We will observe the patient's response to the dilatation with the  hopes that this will help his dysphagia.  He also has an esophageal  dysmotility which certainly could be a contributing factor to his dysphagia  and if  the dilation does not help, I would suspect that his continued  dysphagia is secondary to his esophageal dysmotility.  The CLO test that was  obtained during this procedure will be checked to look for evidence of  Helicobacter pylori and if it is  positive, it will be treated.  Because of the findings of antral erosive  gastritis, I will give this patient a prescription for Aciphex 20 mg daily.  I suspect that the aspirin that he takes as well as ibuprofen is a  contributing factor to the erosions seen in the stomach.                                               Graylin Shiver, M.D.    SFG/MEDQ  D:  11/13/2002  T:  11/13/2002  Job:  [id number]   cc:   Peter M. Swaziland, M.D.  [address]  [place], [place] [address]  Fax: [phone]

## 2010-10-11 ENCOUNTER — Encounter: Payer: Self-pay | Admitting: Cardiology

## 2010-10-13 ENCOUNTER — Other Ambulatory Visit: Payer: Self-pay | Admitting: *Deleted

## 2010-10-13 DIAGNOSIS — Z79899 Other long term (current) drug therapy: Secondary | ICD-10-CM

## 2010-10-13 DIAGNOSIS — E785 Hyperlipidemia, unspecified: Secondary | ICD-10-CM

## 2010-10-14 ENCOUNTER — Encounter: Payer: Self-pay | Admitting: Cardiology

## 2010-10-14 ENCOUNTER — Other Ambulatory Visit (INDEPENDENT_AMBULATORY_CARE_PROVIDER_SITE_OTHER): Payer: Medicare Other | Admitting: *Deleted

## 2010-10-14 ENCOUNTER — Ambulatory Visit (INDEPENDENT_AMBULATORY_CARE_PROVIDER_SITE_OTHER): Payer: Medicare Other | Admitting: Cardiology

## 2010-10-14 VITALS — BP 130/82 | HR 60 | Ht 66.0 in | Wt 183.0 lb

## 2010-10-14 DIAGNOSIS — I2510 Atherosclerotic heart disease of native coronary artery without angina pectoris: Secondary | ICD-10-CM

## 2010-10-14 DIAGNOSIS — Z954 Presence of other heart-valve replacement: Secondary | ICD-10-CM

## 2010-10-14 DIAGNOSIS — Z953 Presence of xenogenic heart valve: Secondary | ICD-10-CM

## 2010-10-14 DIAGNOSIS — Z79899 Other long term (current) drug therapy: Secondary | ICD-10-CM

## 2010-10-14 DIAGNOSIS — I10 Essential (primary) hypertension: Secondary | ICD-10-CM | POA: Insufficient documentation

## 2010-10-14 DIAGNOSIS — E785 Hyperlipidemia, unspecified: Secondary | ICD-10-CM | POA: Insufficient documentation

## 2010-10-14 LAB — BASIC METABOLIC PANEL
BUN: 25 mg/dL — ABNORMAL HIGH (ref 6–23)
CO2: 25 mEq/L (ref 19–32)
Chloride: 112 mEq/L (ref 96–112)
Creatinine, Ser: 1.3 mg/dL (ref 0.4–1.5)
GFR: 57.37 mL/min — ABNORMAL LOW (ref >60.00)

## 2010-10-14 LAB — CBC WITH DIFFERENTIAL/PLATELET
Basophils Absolute: 0 10*3/uL (ref 0.0–0.1)
Basophils Relative: 0.5 % (ref 0.0–3.0)
Eosinophils Absolute: 0.1 10*3/uL (ref 0.0–0.7)
Eosinophils Relative: 1.5 % (ref 0.0–5.0)
HCT: 34.8 % — ABNORMAL LOW (ref 39.0–52.0)
Hemoglobin: 11.7 g/dL — ABNORMAL LOW (ref 13.0–17.0)
Lymphocytes Relative: 35.8 % (ref 12.0–46.0)
Lymphs Abs: 2.4 10*3/uL (ref 0.7–4.0)
Neutro Abs: 3.7 10*3/uL (ref 1.4–7.7)
RBC: 4.03 Mil/uL — ABNORMAL LOW (ref 4.22–5.81)
WBC: 6.8 10*3/uL (ref 4.5–10.5)

## 2010-10-14 LAB — LIPID PANEL
Cholesterol: 157 mg/dL (ref 0–200)
Total CHOL/HDL Ratio: 4
Triglycerides: 180 mg/dL — ABNORMAL HIGH (ref 0.0–149.0)
VLDL: 36 mg/dL (ref 0.0–40.0)

## 2010-10-14 LAB — HEPATIC FUNCTION PANEL: Bilirubin, Direct: 0.1 mg/dL (ref 0.0–0.3)

## 2010-10-14 NOTE — Assessment & Plan Note (Signed)
Blood pressure control was adequate on his current medications.

## 2010-10-14 NOTE — Assessment & Plan Note (Signed)
His last lab work showed persistent elevation of triglycerides. I think it is important that he eat healthier diet with lots of fruits and vegetables and less eating out. He needs to restrict his sodium intake better. We will continue on his Lipitor therapy.

## 2010-10-14 NOTE — Assessment & Plan Note (Signed)
Patient is asymptomatic. He has a murmur consistent with documented mild gradient across the valve in February 2009.

## 2010-10-14 NOTE — Assessment & Plan Note (Signed)
He is having no significant anginal symptoms. We will continue with his therapy with aspirin, metoprolol, and amlodipine.

## 2010-10-14 NOTE — Patient Instructions (Signed)
You need to watch your diet better and lose weight. Eat out less and eat fresh fruits and vegetables.  We will call with the results of your lab work.  Continue your current medications.  I will see you back in 6 months.

## 2010-10-14 NOTE — Progress Notes (Signed)
[name] Date of Birth: 04-05-1925   History of Present Illness: [name] is seen today for followup. He has been doing fairly well. He admits that his diet hasn't been very good because his wife wants to eat out all the time. He does complain of pain in his legs with walking and this has limited his activity. He occasionally feels unsteady or drunk if he turned suddenly. He has had no significant chest pain. He stays busy making violins.   Current Outpatient Prescriptions on File Prior to Visit  Medication Sig Dispense Refill  . amLODipine-benazepril (LOTREL) 5-20 MG per capsule Take 1 capsule by mouth 2 (two) times daily.        Marland Kitchen aspirin 81 MG tablet Take 81 mg by mouth daily.        Marland Kitchen atorvastatin (LIPITOR) 80 MG tablet Take 80 mg by mouth daily.        . IBUPROFEN PO Take 3 tablets by mouth daily.        . metoprolol (LOPRESSOR) 50 MG tablet Take 50 mg by mouth daily.          Allergies  Allergen Reactions  . Codeine   . Penicillins     Past Medical History  Diagnosis Date  . Hypertension   . Hyperlipidemia   . Coronary artery disease   . Status post aortic valve replacement with tissue   . Carpal tunnel syndrome   . Meningitis     Past Surgical History  Procedure Date  . Coronary artery bypass graft   . Cardiac catheterization 04/2007    EF 60%/severe two-vessel obstructive coronary artery disease/patent saphenous bein graft to rt coronary/ severe stenosis at the ostium of the lt internal mammary artery graft to the abtuse marginal branch/normal lt ventricular function/ severe aortic stenosis/normal rt heart pressures  . Cardiac catheterization 07/25/2002    EF 65%/two-vessel obstructive atherosclerotic coronary artery disease/ patent saphenous vein graft to the distal rt coronary arter//patent lt internal mammary artery graft to the obtuse marginal branch/normal lt ventricular function  . Cardiac catheterization 05/17/1991    EF65%/two-vessel ovstructive  atherosclerotic coronary artery disease/good lt ventricular performance/compared with previous catheterization there is now total occlusion of the tr coronary artery at the prior angioplasty site/good collateral flow to the distal rt coronary artery is seen/there is also progressive disease in the lt circumflex vessel now to obstructive levels  . Cardiac catheterization 07/26/1990    EF60%/borderline obstructive coronary artery disease in the mid lt circumflex & in the rt coronary artery prior angioplasty site/normal lt ventricular function  . Coronary angioplasty 07/12/1990    single vessel obstructive atherosclerotic coronary atrery disease in the rt corornary artery with restenosis of the primary angioplasty site/successful repeat PTCA of the proximal rt coronary arter  . Cardiac catheterization 05/23/1990    successful percutaneous transluminal coronary angioplasty of the proximal rt coronary artery  . Hemorrhoid surgery     at age 77  . Tonsillectomy     at age 77  . Coronary artery bypass graft     x2 using a reverse saphenous vein graft to posterior descending, reseverse saphenous vein graft to lt internal mammary artery at the previous bypass to the circumflex with a no vein harvesting  . Penile prosthesis implant 1989  . Sternotomy     redo median with aortic valve replacement with a pericardial tissue valve/[employer] model 3000,40mm,serial numver [id number]  . Vasectomy     at age 49  . Inguinal  hernia repair 1967    History  Smoking status  . Former Smoker -- 1.5 packs/day for 41 years  . Types: Cigarettes, Cigars  . Quit date: 05/02/1982  Smokeless tobacco  . Never Used    History  Alcohol Use No    Family History  Problem Relation Age of Onset  . Cancer Father     intestinal    Review of Systems: The review of systems is positive for Lightheadedness if he turned suddenly. This is actually improved from before. He has had some mild swelling in his legs.  All  other systems were reviewed and are negative.  Physical Exam: BP 130/82  Pulse 60  Ht 5\' 6"  (1.676 m)  Wt 183 lb (83.008 kg)  BMI 29.54 kg/m2 He is a pleasant elderly white male in no acute distress. HEENT exam is unremarkable. Has no JVD or bruits. Lungs are clear. Cardiac exam reveals a grade 2/6 systolic murmur the right upper sternal border. He has no diastolic murmur or S3. Abdomen is soft and nontender. Pedal pulses are 2+ and symmetric. He has 1+ lower extremity edema. LABORATORY DATA: ECG demonstrates normal sinus rhythm with a normal ECG. He also had a spirometry test in April which were normal.  Assessment / Plan:

## 2010-10-15 ENCOUNTER — Encounter: Payer: Self-pay | Admitting: Cardiology

## 2010-10-15 NOTE — Progress Notes (Signed)
Patient called with lab results. Pt verbalized understanding. Jodette Kaneisha Ellenberger RN  

## 2011-01-01 DIAGNOSIS — I480 Paroxysmal atrial fibrillation: Secondary | ICD-10-CM

## 2011-01-01 DIAGNOSIS — IMO0001 Reserved for inherently not codable concepts without codable children: Secondary | ICD-10-CM

## 2011-01-01 HISTORY — DX: Reserved for inherently not codable concepts without codable children: IMO0001

## 2011-01-01 HISTORY — DX: Paroxysmal atrial fibrillation: I48.0

## 2011-01-07 ENCOUNTER — Other Ambulatory Visit: Payer: Self-pay | Admitting: Cardiology

## 2011-01-07 MED ORDER — METOPROLOL TARTRATE 50 MG PO TABS: 50.0000 mg | ORAL_TABLET | Freq: Every day | ORAL | Status: DC

## 2011-01-07 MED ORDER — ATORVASTATIN CALCIUM 80 MG PO TABS: 80.0000 mg | ORAL_TABLET | Freq: Every day | ORAL | Status: DC

## 2011-01-07 MED ORDER — AMLODIPINE BESY-BENAZEPRIL HCL 5-20 MG PO CAPS: 1.0000 | ORAL_CAPSULE | Freq: Two times a day (BID) | ORAL | Status: DC

## 2011-01-07 NOTE — Telephone Encounter (Signed)
Pt needs all 3 prescriptions refilled with [facility].  Pt has enough for about a week.  Chart in box.

## 2011-01-07 NOTE — Telephone Encounter (Signed)
escribe medication per fax request  

## 2011-01-12 ENCOUNTER — Ambulatory Visit (INDEPENDENT_AMBULATORY_CARE_PROVIDER_SITE_OTHER): Payer: Medicare Other | Admitting: Nurse Practitioner

## 2011-01-12 ENCOUNTER — Encounter: Payer: Self-pay | Admitting: Nurse Practitioner

## 2011-01-12 ENCOUNTER — Telehealth: Payer: Self-pay | Admitting: Cardiology

## 2011-01-12 VITALS — BP 112/68 | HR 118 | Ht 66.0 in | Wt 187.0 lb

## 2011-01-12 DIAGNOSIS — I4891 Unspecified atrial fibrillation: Secondary | ICD-10-CM

## 2011-01-12 DIAGNOSIS — I2510 Atherosclerotic heart disease of native coronary artery without angina pectoris: Secondary | ICD-10-CM

## 2011-01-12 LAB — CBC WITH DIFFERENTIAL/PLATELET
Basophils Absolute: 0 10*3/uL (ref 0.0–0.1)
Basophils Relative: 0.5 % (ref 0.0–3.0)
Eosinophils Absolute: 0.1 10*3/uL (ref 0.0–0.7)
Eosinophils Relative: 0.8 % (ref 0.0–5.0)
HCT: 36.7 % — ABNORMAL LOW (ref 39.0–52.0)
Hemoglobin: 12.1 g/dL — ABNORMAL LOW (ref 13.0–17.0)
Lymphocytes Relative: 34.7 % (ref 12.0–46.0)
Lymphs Abs: 2.9 10*3/uL (ref 0.7–4.0)
MCHC: 33.1 g/dL (ref 30.0–36.0)
MCV: 87.2 fl (ref 78.0–100.0)
Monocytes Absolute: 0.9 10*3/uL (ref 0.1–1.0)
Monocytes Relative: 10.6 % (ref 3.0–12.0)
Neutro Abs: 4.4 10*3/uL (ref 1.4–7.7)
Neutrophils Relative %: 53.4 % (ref 43.0–77.0)
Platelets: 196 10*3/uL (ref 150.0–400.0)
RBC: 4.21 Mil/uL — ABNORMAL LOW (ref 4.22–5.81)
RDW: 16.3 % — ABNORMAL HIGH (ref 11.5–14.6)
WBC: 8.3 10*3/uL (ref 4.5–10.5)

## 2011-01-12 MED ORDER — DIGOXIN 125 MCG PO TABS: 125.0000 ug | ORAL_TABLET | Freq: Every day | ORAL | Status: DC

## 2011-01-12 MED ORDER — DABIGATRAN ETEXILATE MESYLATE 150 MG PO CAPS: 150.0000 mg | ORAL_CAPSULE | Freq: Two times a day (BID) | ORAL | Status: DC

## 2011-01-12 NOTE — Assessment & Plan Note (Signed)
This is new for him. I explained the diagnosis and that he is at increased risk of stroke. He adamantly refuses to take coumadin. He will agree to Pradaxa. He does have AVR in place. Has mild mitral insufficiency per last echo.  He refuses to take more Lopressor (on probable tartrate). I have talked with Dr. Patty Sermons. We will give Pradaxa 150 mg BID. We will start him on Lanoxin 0.125 mg. He will take two of the Lanoxin tabs tonight and then one daily. Last GFR was 57. BUN was 25 and creatinine was 1.3. Labs are checked today. We will update the echo. I will see him back next week with Dr. Swaziland. Patient and his wife are agreeable to this plan and will call if any problems develop in the interim.

## 2011-01-12 NOTE — Patient Instructions (Addendum)
You are in atrial fib and your heart rate is going to fast. We need to slow your heart rate down and put you on blood thinner to prevent a stroke We are going to check your labs today We are going to get an ultrasound of your heart I want you to start taking Lanoxin 0.125 mg tablets. Take two of them today and then one each morning. This will slow your heart rate down. This is at [facility] I want you to start Pradaxa 150 mg two times a day. This is to thin your blood and prevent stroke. Use the samples.   Stay on your other medicines except stop your aspirin and ibuprofen. You can use tylenol for pain

## 2011-01-12 NOTE — Assessment & Plan Note (Signed)
Has had some recurrent chest tightness but I suspect is more related to elevated heart rate.

## 2011-01-12 NOTE — Progress Notes (Signed)
[name] Date of Birth: 05-26-1924   History of Present Illness: [name] is seen today for a work in visit. [name] is seen for [name]. [name] called earlier today to report an episode of chest pain. His wife says [name] has not felt well for at least one week. [name] is dizzy. [name] does not note any heart racing or palpitations. [name] felt "tight" in the chest yesterday and today felt tight under his arms. [name] used NTG today without prompt relief but states that it just gradually went away. [name] remains active. No bleeding reported. Currently painfree.   Current Outpatient Prescriptions on File Prior to Visit  Medication Sig Dispense Refill  . amLODipine-benazepril (LOTREL) 5-20 MG per capsule Take 1 capsule by mouth 2 (two) times daily.  180 capsule  3  . atorvastatin (LIPITOR) 80 MG tablet Take 1 tablet (80 mg total) by mouth daily.  90 tablet  3  . metoprolol (LOPRESSOR) 50 MG tablet Take 1 tablet (50 mg total) by mouth daily.  90 tablet  3  . nitroGLYCERIN (NITROSTAT) 0.4 MG SL tablet Place 0.4 mg under the tongue every 5 (five) minutes as needed.          Allergies  Allergen Reactions  . Codeine   . Penicillins     Past Medical History  Diagnosis Date  . Hypertension   . Hyperlipidemia   . Coronary artery disease     s/p CABG & redo CABG with AVR  . Status post aortic valve replacement with tissue     for aortic stenosis  . Carpal tunnel syndrome   . Meningitis   . Atrial fibrillation Sept 2012  . PUD (peptic ulcer disease)     Remote history  . Carotid arterial disease     Doppler in 2003 with 40 to 60% L ICA    Past Surgical History  Procedure Date  . Coronary artery bypass graft   . Cardiac catheterization 04/2007    EF 60%/severe two-vessel obstructive coronary artery disease/patent saphenous bein graft to rt coronary/ severe stenosis at the ostium of the lt internal mammary artery graft to the abtuse marginal branch/normal lt ventricular function/ severe aortic  stenosis/normal rt heart pressures  . Cardiac catheterization 07/25/2002    EF 65%/two-vessel obstructive atherosclerotic coronary artery disease/ patent saphenous vein graft to the distal rt coronary arter//patent lt internal mammary artery graft to the obtuse marginal branch/normal lt ventricular function  . Cardiac catheterization 05/17/1991    EF65%/two-vessel ovstructive atherosclerotic coronary artery disease/good lt ventricular performance/compared with previous catheterization there is now total occlusion of the tr coronary artery at the prior angioplasty site/good collateral flow to the distal rt coronary artery is seen/there is also progressive disease in the lt circumflex vessel now to obstructive levels  . Cardiac catheterization 07/26/1990    EF60%/borderline obstructive coronary artery disease in the mid lt circumflex & in the rt coronary artery prior angioplasty site/normal lt ventricular function  . Coronary angioplasty 07/12/1990    single vessel obstructive atherosclerotic coronary atrery disease in the rt corornary artery with restenosis of the primary angioplasty site/successful repeat PTCA of the proximal rt coronary arter  . Cardiac catheterization 05/23/1990    successful percutaneous transluminal coronary angioplasty of the proximal rt coronary artery  . Hemorrhoid surgery     at age 51  . Tonsillectomy     at age 80  . Coronary artery bypass graft     x2 using a reverse saphenous vein graft  to posterior descending, reseverse saphenous vein graft to lt internal mammary artery at the previous bypass to the circumflex with a no vein harvesting  . Penile prosthesis implant 1989  . Sternotomy     redo median with aortic valve replacement with a pericardial tissue valve/[employer] model 3000,27mm,serial numver [id number]  . Vasectomy     at age 9  . Inguinal hernia repair 1967    History  Smoking status  . Former Smoker -- 1.5 packs/day for 41 years  . Types:  Cigarettes, Cigars  . Quit date: 05/02/1982  Smokeless tobacco  . Never Used    History  Alcohol Use No    Family History  Problem Relation Age of Onset  . Cancer Father     intestinal    Review of Systems: The review of systems is positive for chronic back pain. [name] uses ibuprofen regularly. No reports of bleeding. No falls.  All other systems were reviewed and are negative.  Physical Exam: BP 112/68  Pulse 118  Ht 5\' 6"  (1.676 m)  Wt 187 lb (84.823 kg)  BMI 30.18 kg/m2 Patient is very pleasant and in no acute distress. Skin is warm and dry. Color is normal.  HEENT is unremarkable. Normocephalic/atraumatic. PERRL. Sclera are nonicteric. Neck is supple. No masses. No JVD. Lungs are clear. Cardiac exam shows an irregular rate and rhythm with murmur. [name] is tachycardic on exam. Abdomen is soft. Extremities are without edema. Gait and ROM are intact.[name] is using a cane. No gross neurologic deficits noted.   LABORATORY DATA: EKG shows atrial fib with RVR. Rate is 118. Tracing is reviewed with [name]  Assessment / Plan:

## 2011-01-12 NOTE — Telephone Encounter (Signed)
Spoke w/wife. States Cecile hasn't been feeling good for a few days. Had chest pain this AM and (L) arm went numb w/pain under arm. Took NTG and was relieved. Also c/o dizziness. Wants him to be seen today. Will schedule w/Lori at 3:00

## 2011-01-12 NOTE — Telephone Encounter (Signed)
Pt  Was having pain under and down arm, some chest pain, some sob, took 1 nitro feels better, pls advise

## 2011-01-13 ENCOUNTER — Ambulatory Visit (HOSPITAL_COMMUNITY): Payer: Medicare Other | Attending: Cardiology | Admitting: Radiology

## 2011-01-13 DIAGNOSIS — E669 Obesity, unspecified: Secondary | ICD-10-CM | POA: Insufficient documentation

## 2011-01-13 DIAGNOSIS — I359 Nonrheumatic aortic valve disorder, unspecified: Secondary | ICD-10-CM | POA: Insufficient documentation

## 2011-01-13 DIAGNOSIS — I10 Essential (primary) hypertension: Secondary | ICD-10-CM | POA: Insufficient documentation

## 2011-01-13 DIAGNOSIS — I079 Rheumatic tricuspid valve disease, unspecified: Secondary | ICD-10-CM | POA: Insufficient documentation

## 2011-01-13 DIAGNOSIS — I4891 Unspecified atrial fibrillation: Secondary | ICD-10-CM

## 2011-01-13 DIAGNOSIS — E785 Hyperlipidemia, unspecified: Secondary | ICD-10-CM | POA: Insufficient documentation

## 2011-01-13 LAB — BASIC METABOLIC PANEL
BUN: 35 mg/dL — ABNORMAL HIGH (ref 6–23)
CO2: 23 mEq/L (ref 19–32)
Calcium: 9.2 mg/dL (ref 8.4–10.5)
Chloride: 112 mEq/L (ref 96–112)
Creatinine, Ser: 1.7 mg/dL — ABNORMAL HIGH (ref 0.4–1.5)
GFR: 42.09 mL/min — ABNORMAL LOW (ref >60.00)
Glucose, Bld: 91 mg/dL (ref 70–99)
Potassium: 5.2 mEq/L — ABNORMAL HIGH (ref 3.5–5.1)
Sodium: 143 mEq/L (ref 135–145)

## 2011-01-13 LAB — TSH: TSH: 3.19 u[IU]/mL (ref 0.35–5.50)

## 2011-01-19 ENCOUNTER — Encounter: Payer: Self-pay | Admitting: Nurse Practitioner

## 2011-01-19 ENCOUNTER — Ambulatory Visit (INDEPENDENT_AMBULATORY_CARE_PROVIDER_SITE_OTHER): Payer: Medicare Other | Admitting: Nurse Practitioner

## 2011-01-19 VITALS — BP 140/70 | HR 73 | Ht 66.0 in | Wt 185.4 lb

## 2011-01-19 DIAGNOSIS — I4891 Unspecified atrial fibrillation: Secondary | ICD-10-CM

## 2011-01-19 DIAGNOSIS — I480 Paroxysmal atrial fibrillation: Secondary | ICD-10-CM

## 2011-01-19 MED ORDER — ASPIRIN 81 MG PO CHEW: 81.0000 mg | CHEWABLE_TABLET | Freq: Every day | ORAL | Status: AC

## 2011-01-19 NOTE — Progress Notes (Signed)
[name] Date of Birth: [birth year]   History of Present Illness: [name] is seen today for a follow up visit. It is a one week check. He is subsequently seen with Dr. Sw[place]. He has had atrial fib and was started on Pradaxa and Lanoxin. An echo showed normal LV function with LAE and mild AS. He is feeling better. He has a headache that he attributes to the medicines I started last week. He otherwise feels good. He is bruising.   Current Outpatient Prescriptions on File Prior to Visit  Medication Sig Dispense Refill  . amLODipine-benazepril (LOTREL) 5-20 MG per capsule Take 1 capsule by mouth 2 (two) times daily.  180 capsule  3  . atorvastatin (LIPITOR) 80 MG tablet Take 1 tablet (80 mg total) by mouth daily.  90 tablet  3  . digoxin (LANOXIN) 0.125 MG tablet Take 1 tablet (125 mcg total) by mouth daily.  30 tablet  11  . metoprolol (LOPRESSOR) 50 MG tablet Take 1 tablet (50 mg total) by mouth daily.  90 tablet  3  . nitroGLYCERIN (NITROSTAT) 0.4 MG SL tablet Place 0.4 mg under the tongue every 5 (five) minutes as needed.          Allergies  Allergen Reactions  . Codeine   . Penicillins     Past Medical History  Diagnosis Date  . Hypertension   . Hyperlipidemia   . Coronary artery disease     s/p CABG & redo CABG with AVR  . Status post aortic valve replacement with tissue     for aortic stenosis  . Carpal tunnel syndrome   . Meningitis   . Atrial fibrillation Sept 2012    converted spontaneously  . PUD (peptic ulcer disease)     Remote history  . Carotid arterial disease     Doppler in 2003 with 40 to 60% L ICA  . Normal echocardiogram Sept 2012    Has normal EF, mild AS and moderate LAE    Past Surgical History  Procedure Date  . Coronary artery bypass graft   . Cardiac catheterization 04/2007    EF 60%/severe two-vessel obstructive coronary artery disease/patent saphenous bein graft to rt coronary/ severe stenosis at the ostium of the lt internal mammary  artery graft to the abtuse marginal branch/normal lt ventricular function/ severe aortic stenosis/normal rt heart pressures  . Cardiac catheterization 07/25/2002    EF 65%/two-vessel obstructive atherosclerotic coronary artery disease/ patent saphenous vein graft to the distal rt coronary arter//patent lt internal mammary artery graft to the obtuse marginal branch/normal lt ventricular function  . Cardiac catheterization 05/17/1991    EF65%/two-vessel ovstructive atherosclerotic coronary artery disease/good lt ventricular performance/compared with previous catheterization there is now total occlusion of the tr coronary artery at the prior angioplasty site/good collateral flow to the distal rt coronary artery is seen/there is also progressive disease in the lt circumflex vessel now to obstructive levels  . Cardiac catheterization 07/26/1990    EF60%/borderline obstructive coronary artery disease in the mid lt circumflex & in the rt coronary artery prior angioplasty site/normal lt ventricular function  . Coronary angioplasty 07/12/1990    single vessel obstructive atherosclerotic coronary atrery disease in the rt corornary artery with restenosis of the primary angioplasty site/successful repeat PTCA of the proximal rt coronary arter  . Cardiac catheterization 05/23/1990    successful percutaneous transluminal coronary angioplasty of the proximal rt coronary artery  . Hemorrhoid surgery     at age 77  .  Tonsillectomy     at age 77  . Coronary artery bypass graft     x2 using a reverse saphenous vein graft to posterior descending, reseverse saphenous vein graft to lt internal mammary artery at the previous bypass to the circumflex with a no vein harvesting  . Penile prosthesis implant 1989  . Sternotomy     redo median with aortic valve replacement with a pericardial tissue valve/[employer] model 3000,71mm,serial numver [id number]  . Vasectomy     at age 77  . Inguinal hernia repair 1967     History  Smoking status  . Former Smoker -- 1.5 packs/day for 41 years  . Types: Cigarettes, Cigars  . Quit date: 05/02/1982  Smokeless tobacco  . Never Used    History  Alcohol Use No    Family History  Problem Relation Age of Onset  . Cancer Father     intestinal    Review of Systems: The review of systems is positive for mild bruising and headache. No dizziness. Energy level is good. No more chest pain reported.  All other systems were reviewed and are negative.  Physical Exam: BP 140/70  Pulse 73  Ht 5\' 6"  (1.676 m)  Wt 185 lb 6.4 oz (84.097 kg)  BMI 29.92 kg/m2 Patient is very pleasant and in no acute distress. Skin is warm and dry. Color is normal.  HEENT is unremarkable. Normocephalic/atraumatic. PERRL. Sclera are nonicteric. Neck is supple. No masses. No JVD. Lungs are clear. Cardiac exam shows a regular rate and rhythm. He has a soft outflow murmur.  Abdomen is soft. Extremities are without edema. Gait and ROM are intact. No gross neurologic deficits noted.   LABORATORY DATA: EKG today shows sinus rhythm today. Tracing is reviewed with Dr. Swaziland.    Assessment / Plan:

## 2011-01-19 NOTE — Patient Instructions (Addendum)
You are back in a regular rhythm now. This is good. We are going to stop the Pradaxa.  Go back on your baby aspirin Stay on the Lanoxin The ultrasound of your heart looks real good. Your heart is a strong pump I want to see you in a month. We will check some lab when you come back. You may resume your regular activities.   Call for any problems.

## 2011-01-19 NOTE — Assessment & Plan Note (Signed)
He is back in sinus. I have talked to Dr. Sw[place]iland. We will stop the Pradaxa and restart his aspirin. If he has recurrence, he will need to be committed to long term anticoagulation. He will stay on the Lanoxin. I will see him back in a month. Patient is agreeable to this plan and will call if any problems develop in the interim.

## 2011-01-20 LAB — BLOOD GAS, ARTERIAL
Acid-base deficit: 1
Bicarbonate: 23.1
Drawn by: 206361
FIO2: 0.21
O2 Saturation: 95.8
Patient temperature: 98.6
TCO2: 24.2
pCO2 arterial: 37.7
pH, Arterial: 7.403
pO2, Arterial: 78.2 — ABNORMAL LOW

## 2011-01-20 LAB — CBC
HCT: 39.9
Hemoglobin: 13.4
MCHC: 33.5
MCV: 83.8
Platelets: 235
RBC: 4.76
RDW: 14.6
WBC: 7.2

## 2011-01-20 LAB — APTT: aPTT: 29

## 2011-01-20 LAB — URINALYSIS, ROUTINE W REFLEX MICROSCOPIC
Bilirubin Urine: NEGATIVE
Glucose, UA: NEGATIVE
Ketones, ur: NEGATIVE
Leukocytes, UA: NEGATIVE
Nitrite: NEGATIVE
Protein, ur: NEGATIVE
Specific Gravity, Urine: 1.018
Urobilinogen, UA: 0.2
pH: 5.5

## 2011-01-20 LAB — TYPE AND SCREEN
ABO/RH(D): A POS
Antibody Screen: NEGATIVE

## 2011-01-20 LAB — COMPREHENSIVE METABOLIC PANEL
ALT: 24
AST: 24
Albumin: 3.6
Alkaline Phosphatase: 53
BUN: 16
CO2: 22
Calcium: 9.6
Chloride: 108
Creatinine, Ser: 1.04
GFR calc Af Amer: >60
GFR calc non Af Amer: >60
Glucose, Bld: 105 — ABNORMAL HIGH
Potassium: 4.4
Sodium: 139
Total Bilirubin: 0.5
Total Protein: 6.3

## 2011-01-20 LAB — PROTIME-INR
INR: 0.9
Prothrombin Time: 12.4

## 2011-01-20 LAB — URINE MICROSCOPIC-ADD ON

## 2011-01-20 LAB — HEMOGLOBIN A1C
Hgb A1c MFr Bld: 6.1
Mean Plasma Glucose: 140

## 2011-01-20 LAB — ABO/RH: ABO/RH(D): A POS

## 2011-01-21 LAB — PREPARE FRESH FROZEN PLASMA

## 2011-01-21 LAB — BASIC METABOLIC PANEL
BUN: 12
BUN: 18
BUN: 21
BUN: 21
CO2: 24
CO2: 27
CO2: 28
CO2: 31
Calcium: 7.6 — ABNORMAL LOW
Calcium: 8 — ABNORMAL LOW
Calcium: 8 — ABNORMAL LOW
Calcium: 8.4
Chloride: 101
Chloride: 101
Chloride: 106
Chloride: 111
Creatinine, Ser: 1.12
Creatinine, Ser: 1.32
Creatinine, Ser: 1.35
Creatinine, Ser: 1.57 — ABNORMAL HIGH
GFR calc Af Amer: 52 — ABNORMAL LOW
GFR calc Af Amer: >60
GFR calc Af Amer: >60
GFR calc Af Amer: >60
GFR calc non Af Amer: 43 — ABNORMAL LOW
GFR calc non Af Amer: 51 — ABNORMAL LOW
GFR calc non Af Amer: 52 — ABNORMAL LOW
GFR calc non Af Amer: >60
Glucose, Bld: 112 — ABNORMAL HIGH
Glucose, Bld: 112 — ABNORMAL HIGH
Glucose, Bld: 133 — ABNORMAL HIGH
Glucose, Bld: 88
Potassium: 3.6
Potassium: 3.8
Potassium: 4.1
Potassium: 4.3
Sodium: 134 — ABNORMAL LOW
Sodium: 137
Sodium: 139
Sodium: 142

## 2011-01-21 LAB — POCT I-STAT 4, (NA,K, GLUC, HGB,HCT)
Glucose, Bld: 100 — ABNORMAL HIGH
Glucose, Bld: 102 — ABNORMAL HIGH
Glucose, Bld: 102 — ABNORMAL HIGH
Glucose, Bld: 114 — ABNORMAL HIGH
Glucose, Bld: 114 — ABNORMAL HIGH
Glucose, Bld: 137 — ABNORMAL HIGH
Glucose, Bld: 138 — ABNORMAL HIGH
Glucose, Bld: 138 — ABNORMAL HIGH
Glucose, Bld: 93
Glucose, Bld: 96
HCT: 20 — ABNORMAL LOW
HCT: 21 — ABNORMAL LOW
HCT: 21 — ABNORMAL LOW
HCT: 22 — ABNORMAL LOW
HCT: 22 — ABNORMAL LOW
HCT: 23 — ABNORMAL LOW
HCT: 24 — ABNORMAL LOW
HCT: 26 — ABNORMAL LOW
HCT: 30 — ABNORMAL LOW
HCT: 30 — ABNORMAL LOW
Hemoglobin: 10.2 — ABNORMAL LOW
Hemoglobin: 10.2 — ABNORMAL LOW
Hemoglobin: 6.8 — CL
Hemoglobin: 7.1 — CL
Hemoglobin: 7.1 — CL
Hemoglobin: 7.5 — CL
Hemoglobin: 7.5 — CL
Hemoglobin: 7.8 — CL
Hemoglobin: 8.2 — ABNORMAL LOW
Hemoglobin: 8.8 — ABNORMAL LOW
Operator id: 137421
Operator id: 3291
Operator id: 3291
Operator id: 3291
Operator id: 3291
Operator id: 3291
Operator id: 3291
Operator id: 3291
Operator id: 3291
Operator id: 3291
Potassium: 3.7
Potassium: 3.8
Potassium: 4
Potassium: 4
Potassium: 4.1
Potassium: 4.7
Potassium: 4.8
Potassium: 4.8
Potassium: 4.9
Potassium: 5
Sodium: 135
Sodium: 136
Sodium: 137
Sodium: 138
Sodium: 139
Sodium: 140
Sodium: 140
Sodium: 142
Sodium: 142
Sodium: 144

## 2011-01-21 LAB — HEMOGLOBIN AND HEMATOCRIT, BLOOD
HCT: 24.6 — ABNORMAL LOW
Hemoglobin: 8.4 — ABNORMAL LOW

## 2011-01-21 LAB — BASIC METABOLIC PANEL WITH GFR
BUN: 14
BUN: 25 — ABNORMAL HIGH
CO2: 29
CO2: 29
Calcium: 8.1 — ABNORMAL LOW
Calcium: 8.3 — ABNORMAL LOW
Chloride: 104
Chloride: 111
Creatinine, Ser: 1.19
Creatinine, Ser: 1.58 — ABNORMAL HIGH
GFR calc non Af Amer: 42 — ABNORMAL LOW
GFR calc non Af Amer: 59 — ABNORMAL LOW
Glucose, Bld: 132 — ABNORMAL HIGH
Glucose, Bld: 91
Potassium: 3.5
Potassium: 4.2
Sodium: 138
Sodium: 144

## 2011-01-21 LAB — POCT I-STAT 3, ART BLOOD GAS (G3+)
Acid-base deficit: 2
Bicarbonate: 23.5
Bicarbonate: 24.8 — ABNORMAL HIGH
Bicarbonate: 25.8 — ABNORMAL HIGH
O2 Saturation: 100
O2 Saturation: 92
O2 Saturation: 96
Operator id: 137421
Operator id: 260261
Operator id: 3291
Patient temperature: 36.1
Patient temperature: 37.7
TCO2: 25
TCO2: 26
TCO2: 27
pCO2 arterial: 39.7
pCO2 arterial: 40.4
pCO2 arterial: 48.3 — ABNORMAL HIGH
pH, Arterial: 7.33 — ABNORMAL LOW
pH, Arterial: 7.38
pH, Arterial: 7.399
pO2, Arterial: 306 — ABNORMAL HIGH
pO2, Arterial: 65 — ABNORMAL LOW
pO2, Arterial: 83

## 2011-01-21 LAB — CBC
HCT: 22.7 — ABNORMAL LOW
HCT: 25.8 — ABNORMAL LOW
HCT: 26 — ABNORMAL LOW
HCT: 26.3 — ABNORMAL LOW
HCT: 26.5 — ABNORMAL LOW
HCT: 26.7 — ABNORMAL LOW
HCT: 27.5 — ABNORMAL LOW
Hemoglobin: 7.7 — CL
Hemoglobin: 8.6 — ABNORMAL LOW
Hemoglobin: 8.6 — ABNORMAL LOW
Hemoglobin: 8.9 — ABNORMAL LOW
Hemoglobin: 9 — ABNORMAL LOW
Hemoglobin: 9.1 — ABNORMAL LOW
Hemoglobin: 9.4 — ABNORMAL LOW
MCHC: 32.9
MCHC: 33.4
MCHC: 33.7
MCHC: 33.8
MCHC: 34.1
MCHC: 34.2
MCHC: 34.5
MCV: 84.1
MCV: 84.1
MCV: 84.1
MCV: 84.9
MCV: 85.5
MCV: 85.6
MCV: 86.2
Platelets: 110 — ABNORMAL LOW
Platelets: 111 — ABNORMAL LOW
Platelets: 113 — ABNORMAL LOW
Platelets: 120 — ABNORMAL LOW
Platelets: 123 — ABNORMAL LOW
Platelets: 127 — ABNORMAL LOW
Platelets: 153
RBC: 2.7 — ABNORMAL LOW
RBC: 3.02 — ABNORMAL LOW
RBC: 3.03 — ABNORMAL LOW
RBC: 3.1 — ABNORMAL LOW
RBC: 3.13 — ABNORMAL LOW
RBC: 3.15 — ABNORMAL LOW
RBC: 3.24 — ABNORMAL LOW
RDW: 14.4
RDW: 14.5
RDW: 14.7
RDW: 14.7
RDW: 15
RDW: 15.2
RDW: 15.4
WBC: 6.6
WBC: 7.1
WBC: 8.1
WBC: 8.3
WBC: 8.3
WBC: 8.4
WBC: 9.4

## 2011-01-21 LAB — POCT I-STAT 3, VENOUS BLOOD GAS (G3P V)
Acid-base deficit: 4 — ABNORMAL HIGH
Bicarbonate: 21.2
O2 Saturation: 66
Operator id: 3291
TCO2: 22
pCO2, Ven: 38.5 — ABNORMAL LOW
pH, Ven: 7.348 — ABNORMAL HIGH
pO2, Ven: 36

## 2011-01-21 LAB — DIFFERENTIAL
Basophils Absolute: 0
Basophils Relative: 0
Eosinophils Absolute: 0.1
Eosinophils Relative: 2
Lymphocytes Relative: 16
Lymphs Abs: 1.5
Monocytes Absolute: 0.9
Monocytes Relative: 10
Neutro Abs: 6.8
Neutrophils Relative %: 73

## 2011-01-21 LAB — I-STAT EC8
Acid-base deficit: 1
BUN: 12
Bicarbonate: 24.2 — ABNORMAL HIGH
Chloride: 108
Glucose, Bld: 117 — ABNORMAL HIGH
HCT: 25 — ABNORMAL LOW
Hemoglobin: 8.5 — ABNORMAL LOW
Operator id: 260261
Potassium: 4.4
Sodium: 141
TCO2: 25
pCO2 arterial: 41.3
pH, Arterial: 7.375

## 2011-01-21 LAB — PREPARE PLATELET PHERESIS

## 2011-01-21 LAB — POCT I-STAT GLUCOSE
Glucose, Bld: 102 — ABNORMAL HIGH
Operator id: 190201

## 2011-01-21 LAB — CREATININE, SERUM
Creatinine, Ser: 1.19
GFR calc non Af Amer: 59 — ABNORMAL LOW

## 2011-01-21 LAB — PROTIME-INR
INR: 1.4
Prothrombin Time: 17 — ABNORMAL HIGH

## 2011-01-21 LAB — MAGNESIUM
Magnesium: 2.5
Magnesium: 2.7 — ABNORMAL HIGH

## 2011-01-21 LAB — PLATELET COUNT: Platelets: 117 — ABNORMAL LOW

## 2011-01-21 LAB — APTT

## 2011-02-04 LAB — POCT I-STAT 3, ART BLOOD GAS (G3+)
Acid-base deficit: 2
Bicarbonate: 22.5
Operator id: 141321
pH, Arterial: 7.403
pO2, Arterial: 71 — ABNORMAL LOW

## 2011-02-04 LAB — POCT I-STAT 3, VENOUS BLOOD GAS (G3P V)
TCO2: 25
pCO2, Ven: 44.2 — ABNORMAL LOW
pH, Ven: 7.334 — ABNORMAL HIGH
pO2, Ven: 37

## 2011-02-18 ENCOUNTER — Encounter: Payer: Self-pay | Admitting: Nurse Practitioner

## 2011-02-18 ENCOUNTER — Ambulatory Visit (INDEPENDENT_AMBULATORY_CARE_PROVIDER_SITE_OTHER): Payer: Medicare Other | Admitting: Nurse Practitioner

## 2011-02-18 VITALS — BP 138/58 | HR 74 | Ht 66.0 in | Wt 186.8 lb

## 2011-02-18 DIAGNOSIS — Z953 Presence of xenogenic heart valve: Secondary | ICD-10-CM

## 2011-02-18 DIAGNOSIS — I480 Paroxysmal atrial fibrillation: Secondary | ICD-10-CM

## 2011-02-18 DIAGNOSIS — I2510 Atherosclerotic heart disease of native coronary artery without angina pectoris: Secondary | ICD-10-CM

## 2011-02-18 DIAGNOSIS — I4891 Unspecified atrial fibrillation: Secondary | ICD-10-CM

## 2011-02-18 DIAGNOSIS — I359 Nonrheumatic aortic valve disorder, unspecified: Secondary | ICD-10-CM

## 2011-02-18 NOTE — Assessment & Plan Note (Signed)
No chest pain. Doing well

## 2011-02-18 NOTE — Patient Instructions (Addendum)
You may use your ibuprofen sparingly.  Stay on your current medicines  We are going to check your drug level for the digoxin  I will have you see Dr. Swaziland in about 3 months.  Call for any problems.

## 2011-02-18 NOTE — Assessment & Plan Note (Signed)
No recurrence. He is doing well. Will check a digoxin level today. We will see him back in 3 months.

## 2011-02-18 NOTE — Assessment & Plan Note (Signed)
Has had recent echo. Has normal EF with mild AS. Will continue to follow.

## 2011-02-18 NOTE — Progress Notes (Signed)
[name] Date of Birth: 1924-08-14 Medical Record #[id number]  History of Present Illness: [name] comes back in today for a one month check. He is seen for Dr. Sw[place]iland. He is doing well. No recurrence of atrial fib that he knows. He is only on aspirin. Echo showed normal LV function with LAE and mild AS. He does have a headache that he says he has had since starting the lanoxin. We will check a level today. He wants to use some ibuprofen sparingly. No chest pain or shortness of breath. He tries to stay active.   Current Outpatient Prescriptions on File Prior to Visit  Medication Sig Dispense Refill  . amLODipine-benazepril (LOTREL) 5-20 MG per capsule Take 1 capsule by mouth 2 (two) times daily.  180 capsule  3  . aspirin (ASPIRIN CHILDRENS) 81 MG chewable tablet Chew 1 tablet (81 mg total) by mouth daily.  36 tablet  11  . atorvastatin (LIPITOR) 80 MG tablet Take 1 tablet (80 mg total) by mouth daily.  90 tablet  3  . digoxin (LANOXIN) 0.125 MG tablet Take 1 tablet (125 mcg total) by mouth daily.  30 tablet  11  . metoprolol (LOPRESSOR) 50 MG tablet Take 1 tablet (50 mg total) by mouth daily.  90 tablet  3  . nitroGLYCERIN (NITROSTAT) 0.4 MG SL tablet Place 0.4 mg under the tongue every 5 (five) minutes as needed.          Allergies  Allergen Reactions  . Codeine   . Penicillins     Past Medical History  Diagnosis Date  . Hypertension   . Hyperlipidemia   . Coronary artery disease     s/p CABG & redo CABG with AVR  . Status post aortic valve replacement with tissue     for aortic stenosis  . Carpal tunnel syndrome   . Meningitis   . Atrial fibrillation Sept 2012    converted spontaneously  . PUD (peptic ulcer disease)     Remote history  . Carotid arterial disease     Doppler in 2003 with 40 to 60% L ICA  . Normal echocardiogram Sept 2012    Has normal EF, mild AS and moderate LAE    Past Surgical History  Procedure Date  . Coronary artery bypass graft   .  Cardiac catheterization 04/2007    EF 60%/severe two-vessel obstructive coronary artery disease/patent saphenous bein graft to rt coronary/ severe stenosis at the ostium of the lt internal mammary artery graft to the abtuse marginal branch/normal lt ventricular function/ severe aortic stenosis/normal rt heart pressures  . Cardiac catheterization 07/25/2002    EF 65%/two-vessel obstructive atherosclerotic coronary artery disease/ patent saphenous vein graft to the distal rt coronary arter//patent lt internal mammary artery graft to the obtuse marginal branch/normal lt ventricular function  . Cardiac catheterization 05/17/1991    EF65%/two-vessel ovstructive atherosclerotic coronary artery disease/good lt ventricular performance/compared with previous catheterization there is now total occlusion of the tr coronary artery at the prior angioplasty site/good collateral flow to the distal rt coronary artery is seen/there is also progressive disease in the lt circumflex vessel now to obstructive levels  . Cardiac catheterization 07/26/1990    EF60%/borderline obstructive coronary artery disease in the mid lt circumflex & in the rt coronary artery prior angioplasty site/normal lt ventricular function  . Coronary angioplasty 07/12/1990    single vessel obstructive atherosclerotic coronary atrery disease in the rt corornary artery with restenosis of the primary angioplasty site/successful repeat PTCA  of the proximal rt coronary arter  . Cardiac catheterization 05/23/1990    successful percutaneous transluminal coronary angioplasty of the proximal rt coronary artery  . Hemorrhoid surgery     at age 89  . Tonsillectomy     at age 41  . Coronary artery bypass graft     x2 using a reverse saphenous vein graft to posterior descending, reseverse saphenous vein graft to lt internal mammary artery at the previous bypass to the circumflex with a no vein harvesting  . Penile prosthesis implant 1989  . Sternotomy      redo median with aortic valve replacement with a pericardial tissue valve/[employer] model 3000,59mm,serial numver [id number]  . Vasectomy     at age 68  . Inguinal hernia repair 1967    History  Smoking status  . Former Smoker -- 1.5 packs/day for 41 years  . Types: Cigarettes, Cigars  . Quit date: 05/02/1982  Smokeless tobacco  . Never Used    History  Alcohol Use No    Family History  Problem Relation Age of Onset  . Cancer Father     intestinal    Review of Systems: The review of systems is positive for arthritis.  All other systems were reviewed and are negative.  Physical Exam: BP 138/58  Pulse 74  Ht 5\' 6"  (1.676 m)  Wt 186 lb 12.8 oz (84.732 kg)  BMI 30.15 kg/m2 Patient is very pleasant and in no acute distress. Skin is warm and dry. Color is normal.  HEENT is unremarkable. Normocephalic/atraumatic. PERRL. Sclera are nonicteric. Neck is supple. No masses. No JVD. Lungs are clear. Cardiac exam shows a regular rate and rhythm. Soft outflow murmur noted.  Abdomen is soft. Extremities are without edema. Gait and ROM are intact. No gross neurologic deficits noted.   LABORATORY DATA: Digoxin level is pending.   Assessment / Plan:

## 2011-02-19 LAB — DIGOXIN LEVEL: Digoxin Level: 0.8 ng/mL (ref 0.8–2.0)

## 2011-02-21 ENCOUNTER — Telehealth: Payer: Self-pay | Admitting: *Deleted

## 2011-02-21 NOTE — Telephone Encounter (Signed)
Notified of lab results. 

## 2011-02-21 NOTE — Telephone Encounter (Signed)
Message copied by Lorayne Bender on Mon Feb 21, 2011  5:33 PM ------      Message from: Rosalio Macadamia      Created: Mon Feb 21, 2011  7:53 AM       Ok to report. Labs are satisfactory. Not sure what is causing the headache but dig level is normal.

## 2011-05-24 ENCOUNTER — Ambulatory Visit: Payer: Medicare Other | Admitting: Nurse Practitioner

## 2011-05-25 ENCOUNTER — Other Ambulatory Visit: Payer: Self-pay | Admitting: *Deleted

## 2011-05-25 ENCOUNTER — Ambulatory Visit (INDEPENDENT_AMBULATORY_CARE_PROVIDER_SITE_OTHER): Payer: Medicare Other | Admitting: Nurse Practitioner

## 2011-05-25 ENCOUNTER — Encounter: Payer: Self-pay | Admitting: Nurse Practitioner

## 2011-05-25 VITALS — BP 138/68 | HR 70 | Ht 66.0 in | Wt 183.0 lb

## 2011-05-25 DIAGNOSIS — E785 Hyperlipidemia, unspecified: Secondary | ICD-10-CM

## 2011-05-25 DIAGNOSIS — I10 Essential (primary) hypertension: Secondary | ICD-10-CM

## 2011-05-25 DIAGNOSIS — I2510 Atherosclerotic heart disease of native coronary artery without angina pectoris: Secondary | ICD-10-CM

## 2011-05-25 DIAGNOSIS — I4891 Unspecified atrial fibrillation: Secondary | ICD-10-CM

## 2011-05-25 LAB — BASIC METABOLIC PANEL
BUN: 23 mg/dL (ref 6–23)
CO2: 22 mEq/L (ref 19–32)
Calcium: 9 mg/dL (ref 8.4–10.5)
Chloride: 111 mEq/L (ref 96–112)
Creatinine, Ser: 1.3 mg/dL (ref 0.4–1.5)
GFR: 57.81 mL/min — ABNORMAL LOW (ref >60.00)
Glucose, Bld: 106 mg/dL — ABNORMAL HIGH (ref 70–99)
Potassium: 4.4 mEq/L (ref 3.5–5.1)
Sodium: 142 mEq/L (ref 135–145)

## 2011-05-25 LAB — HEPATIC FUNCTION PANEL
ALT: 22 U/L (ref 0–53)
AST: 22 U/L (ref 0–37)
Albumin: 3.8 g/dL (ref 3.5–5.2)
Alkaline Phosphatase: 68 U/L (ref 39–117)
Bilirubin, Direct: 0.1 mg/dL (ref 0.0–0.3)
Total Bilirubin: 0.5 mg/dL (ref 0.3–1.2)
Total Protein: 7.4 g/dL (ref 6.0–8.3)

## 2011-05-25 LAB — LIPID PANEL
Cholesterol: 136 mg/dL (ref 0–200)
HDL: 39.6 mg/dL (ref >39.00)
LDL Cholesterol: 65 mg/dL (ref 0–99)
Total CHOL/HDL Ratio: 3
Triglycerides: 159 mg/dL — ABNORMAL HIGH (ref 0.0–149.0)
VLDL: 31.8 mg/dL (ref 0.0–40.0)

## 2011-05-25 MED ORDER — METOPROLOL TARTRATE 50 MG PO TABS: ORAL_TABLET | ORAL | Status: DC

## 2011-05-25 MED ORDER — METOPROLOL TARTRATE 50 MG PO TABS: 50.0000 mg | ORAL_TABLET | Freq: Two times a day (BID) | ORAL | Status: DC

## 2011-05-25 MED ORDER — AMLODIPINE BESY-BENAZEPRIL HCL 5-20 MG PO CAPS: 1.0000 | ORAL_CAPSULE | Freq: Two times a day (BID) | ORAL | Status: DC

## 2011-05-25 NOTE — Patient Instructions (Addendum)
Let's stop the Lanoxin. We will see what if the headaches go away.   Increase your metoprolol to 50 mg two times a day. I have sent this to the drug store.   I have refilled your Lotrel.   We will check your labs today. We will see if your kidney function looks a little better and then let you take some ibuprofen but only as needed.   Stay active.  We will see you in 3 months.   Call the [facility] office at [phone] if you have any questions, problems or concerns.

## 2011-05-25 NOTE — Assessment & Plan Note (Signed)
He is in a regular rhythm today by exam. Will stop his Lanoxin and see if headaches resolve. Will increase the Lopressor to BID. We will see him back in a few months. Patient is agreeable to this plan and will call if any problems develop in the interim.

## 2011-05-25 NOTE — Assessment & Plan Note (Signed)
He has an extensive history of CAD. Has some chest pain on occasion. No NTG use. Doesn't really seem bothered too much. Will continue to monitor.

## 2011-05-25 NOTE — Assessment & Plan Note (Addendum)
Blood pressure is a little borderline and is up some at home. We will increase the Lopressor to BID. He is on the tartrate formula. We will recheck his labs today. He is wanting to resume his ibuprofen.

## 2011-05-25 NOTE — Progress Notes (Signed)
[name] Date of Birth: May 22, 1924 Medical Record #[id number]  History of Present Illness: [name] is seen back today for his 3 month check. He is seen for Dr. [place]. He has a history of known CAD, mild AS, and PAF. He has a normal EF per echo last year. He comes in today. He is here with his wife. He is doing well. Still has headaches that he attributes to his Lanoxin. He seems to be doing ok. He says his rhythm sometimes "gets a little fast" but "its all right". He is trying to stay active. He will have some occasional chest pain that doesn't seem to bothersome for him. Not using any NTG. We reviewed his medicines. He has only been taking his metoprolol once a day and it is the tartrate formula. He does need his Lotrel refilled.   Current Outpatient Prescriptions on File Prior to Visit  Medication Sig Dispense Refill  . aspirin (ASPIRIN CHILDRENS) 81 MG chewable tablet Chew 1 tablet (81 mg total) by mouth daily.  36 tablet  11  . atorvastatin (LIPITOR) 80 MG tablet Take 1 tablet (80 mg total) by mouth daily.  90 tablet  3  . nitroGLYCERIN (NITROSTAT) 0.4 MG SL tablet Place 0.4 mg under the tongue every 5 (five) minutes as needed.        Marland Kitchen DISCONTD: amLODipine-benazepril (LOTREL) 5-20 MG per capsule Take 1 capsule by mouth 2 (two) times daily.  180 capsule  3  . DISCONTD: metoprolol (LOPRESSOR) 50 MG tablet Take 1 tablet (50 mg total) by mouth daily.  90 tablet  3    Allergies  Allergen Reactions  . Codeine   . Penicillins     Past Medical History  Diagnosis Date  . Hypertension   . Hyperlipidemia   . Coronary artery disease     s/p CABG & redo CABG with AVR  . Status post aortic valve replacement with tissue     for aortic stenosis  . Carpal tunnel syndrome   . Meningitis   . PAF (paroxysmal atrial fibrillation) Sept 2012    converted spontaneously  . PUD (peptic ulcer disease)     Remote history  . Carotid arterial disease     Doppler in 2003 with 40 to 60% L ICA   . Normal echocardiogram Sept 2012    Has normal EF, mild AS and moderate LAE    Past Surgical History  Procedure Date  . Coronary artery bypass graft   . Cardiac catheterization 04/2007    EF 60%/severe two-vessel obstructive coronary artery disease/patent saphenous bein graft to rt coronary/ severe stenosis at the ostium of the lt internal mammary artery graft to the abtuse marginal branch/normal lt ventricular function/ severe aortic stenosis/normal rt heart pressures  . Cardiac catheterization 07/25/2002    EF 65%/two-vessel obstructive atherosclerotic coronary artery disease/ patent saphenous vein graft to the distal rt coronary arter//patent lt internal mammary artery graft to the obtuse marginal branch/normal lt ventricular function  . Cardiac catheterization 05/17/1991    EF65%/two-vessel ovstructive atherosclerotic coronary artery disease/good lt ventricular performance/compared with previous catheterization there is now total occlusion of the tr coronary artery at the prior angioplasty site/good collateral flow to the distal rt coronary artery is seen/there is also progressive disease in the lt circumflex vessel now to obstructive levels  . Cardiac catheterization 07/26/1990    EF60%/borderline obstructive coronary artery disease in the mid lt circumflex & in the rt coronary artery prior angioplasty site/normal lt ventricular function  .  Coronary angioplasty 07/12/1990    single vessel obstructive atherosclerotic coronary atrery disease in the rt corornary artery with restenosis of the primary angioplasty site/successful repeat PTCA of the proximal rt coronary arter  . Cardiac catheterization 05/23/1990    successful percutaneous transluminal coronary angioplasty of the proximal rt coronary artery  . Hemorrhoid surgery     at age 78  . Tonsillectomy     at age 78  . Coronary artery bypass graft     x2 using a reverse saphenous vein graft to posterior descending, reseverse saphenous  vein graft to lt internal mammary artery at the previous bypass to the circumflex with a no vein harvesting  . Penile prosthesis implant 1989  . Sternotomy     redo median with aortic valve replacement with a pericardial tissue valve/[employer] model 3000,67mm,serial numver [id number]  . Vasectomy     at age 78  . Inguinal hernia repair 1967    History  Smoking status  . Former Smoker -- 1.5 packs/day for 41 years  . Types: Cigarettes, Cigars  . Quit date: 05/02/1982  Smokeless tobacco  . Never Used    History  Alcohol Use No    Family History  Problem Relation Age of Onset  . Cancer Father     intestinal    Review of Systems: The review of systems is per the HPI.  He doesn't like the headaches and is adamant that it is related to his Lanoxin. Also feels like he doesn't have so much energy. Has fallen a few times and is now keeping his cane close by. Does have back and hip pain with walking. Wanting to take his Ibuprofen. All other systems were reviewed and are negative.  Physical Exam: BP 138/68  Pulse 70  Ht 5\' 6"  (1.676 m)  Wt 183 lb (83.008 kg)  BMI 29.54 kg/m2 Patient is an elderly white male who is in no acute distress. Skin is warm and dry. Color is normal.  HEENT is unremarkable. Normocephalic/atraumatic. PERRL. Sclera are nonicteric. Neck is supple. No masses. No JVD. Lungs are clear. Cardiac exam shows a regular rate and rhythm. Soft outflow murmur noted. Abdomen is soft. Extremities are without edema. Gait and ROM are intact. No gross neurologic deficits noted.  LABORATORY DATA:   Assessment / Plan:

## 2011-05-27 ENCOUNTER — Other Ambulatory Visit: Payer: Self-pay

## 2011-05-27 DIAGNOSIS — I2510 Atherosclerotic heart disease of native coronary artery without angina pectoris: Secondary | ICD-10-CM

## 2011-05-27 DIAGNOSIS — E785 Hyperlipidemia, unspecified: Secondary | ICD-10-CM

## 2011-08-23 ENCOUNTER — Ambulatory Visit (INDEPENDENT_AMBULATORY_CARE_PROVIDER_SITE_OTHER): Payer: Medicare Other | Admitting: Cardiology

## 2011-08-23 ENCOUNTER — Encounter: Payer: Self-pay | Admitting: Cardiology

## 2011-08-23 DIAGNOSIS — Z953 Presence of xenogenic heart valve: Secondary | ICD-10-CM

## 2011-08-23 DIAGNOSIS — I2510 Atherosclerotic heart disease of native coronary artery without angina pectoris: Secondary | ICD-10-CM

## 2011-08-23 DIAGNOSIS — R0989 Other specified symptoms and signs involving the circulatory and respiratory systems: Secondary | ICD-10-CM

## 2011-08-23 DIAGNOSIS — Z954 Presence of other heart-valve replacement: Secondary | ICD-10-CM

## 2011-08-23 DIAGNOSIS — R079 Chest pain, unspecified: Secondary | ICD-10-CM

## 2011-08-23 DIAGNOSIS — I4891 Unspecified atrial fibrillation: Secondary | ICD-10-CM

## 2011-08-23 DIAGNOSIS — Z952 Presence of prosthetic heart valve: Secondary | ICD-10-CM

## 2011-08-23 DIAGNOSIS — R42 Dizziness and giddiness: Secondary | ICD-10-CM

## 2011-08-23 NOTE — Assessment & Plan Note (Signed)
He has a left carotid bruit. Given his recent vertigo symptoms we will obtain carotid Doppler studies to further evaluate. He has also had some intermittent numbness in his left arm and hand.

## 2011-08-23 NOTE — Assessment & Plan Note (Signed)
He is having some intermittent chest pain symptoms that are consistent with angina. He has had no ischemic evaluation since his redo bypass surgery in 2009. We will schedule him for a Lexiscan Myoview study.

## 2011-08-23 NOTE — Progress Notes (Signed)
[name] Date of Birth: 1925/01/03 Medical Record #[id number]  History of Present Illness: [name] is seen back today for a followup visit. He reports that his wife was hospitalized in January for surgery. Since then he's been eating out a lot and has gained at least 4 pounds. On one occasion his wife fell to the floor and picking her up he strained his back. He reports that last night he had an episode of significant vertigo with the room spinning around. It lasted 5-10 minutes. He had no associated nausea or syncope. He has noticed occasional symptoms of chest pain typically while sitting watching TV. He hasn't had to use any nitroglycerin symptoms last less than 5 minutes. He denies any shortness of breath or palpitations.  Current Outpatient Prescriptions on File Prior to Visit  Medication Sig Dispense Refill  . amLODipine-benazepril (LOTREL) 5-20 MG per capsule Take 1 capsule by mouth 2 (two) times daily.  180 capsule  3  . aspirin (ASPIRIN CHILDRENS) 81 MG chewable tablet Chew 1 tablet (81 mg total) by mouth daily.  36 tablet  11  . atorvastatin (LIPITOR) 80 MG tablet Take 1 tablet (80 mg total) by mouth daily.  90 tablet  3  . metoprolol (LOPRESSOR) 50 MG tablet Take 1 tablet (50 mg total) by mouth 2 (two) times daily.  180 tablet  3  . nitroGLYCERIN (NITROSTAT) 0.4 MG SL tablet Place 0.4 mg under the tongue every 5 (five) minutes as needed.          Allergies  Allergen Reactions  . Codeine   . Penicillins     Past Medical History  Diagnosis Date  . Hypertension   . Hyperlipidemia   . Coronary artery disease     s/p CABG & redo CABG with AVR  . Status post aortic valve replacement with tissue     for aortic stenosis  . Carpal tunnel syndrome   . Meningitis   . PAF (paroxysmal atrial fibrillation) Sept 2012    converted spontaneously  . PUD (peptic ulcer disease)     Remote history  . Carotid arterial disease     Doppler in 2003 with 40 to 60% L ICA  . Normal  echocardiogram Sept 2012    Has normal EF, mild AS and moderate LAE    Past Surgical History  Procedure Date  . Coronary artery bypass graft   . Cardiac catheterization 04/2007    EF 60%/severe two-vessel obstructive coronary artery disease/patent saphenous bein graft to rt coronary/ severe stenosis at the ostium of the lt internal mammary artery graft to the abtuse marginal branch/normal lt ventricular function/ severe aortic stenosis/normal rt heart pressures  . Cardiac catheterization 07/25/2002    EF 65%/two-vessel obstructive atherosclerotic coronary artery disease/ patent saphenous vein graft to the distal rt coronary arter//patent lt internal mammary artery graft to the obtuse marginal branch/normal lt ventricular function  . Cardiac catheterization 05/17/1991    EF65%/two-vessel ovstructive atherosclerotic coronary artery disease/good lt ventricular performance/compared with previous catheterization there is now total occlusion of the tr coronary artery at the prior angioplasty site/good collateral flow to the distal rt coronary artery is seen/there is also progressive disease in the lt circumflex vessel now to obstructive levels  . Cardiac catheterization 07/26/1990    EF60%/borderline obstructive coronary artery disease in the mid lt circumflex & in the rt coronary artery prior angioplasty site/normal lt ventricular function  . Coronary angioplasty 07/12/1990    single vessel obstructive atherosclerotic coronary atrery disease  in the rt corornary artery with restenosis of the primary angioplasty site/successful repeat PTCA of the proximal rt coronary arter  . Cardiac catheterization 05/23/1990    successful percutaneous transluminal coronary angioplasty of the proximal rt coronary artery  . Hemorrhoid surgery     at age 20  . Tonsillectomy     at age 41  . Coronary artery bypass graft     x2 using a reverse saphenous vein graft to posterior descending, reseverse saphenous vein graft to  lt internal mammary artery at the previous bypass to the circumflex with a no vein harvesting  . Penile prosthesis implant 1989  . Sternotomy     redo median with aortic valve replacement with a pericardial tissue valve/[employer] model 3000,12mm,serial numver [id number]  . Vasectomy     at age 51  . Inguinal hernia repair 1967    History  Smoking status  . Former Smoker -- 1.5 packs/day for 41 years  . Types: Cigarettes, Cigars  . Quit date: 05/02/1982  Smokeless tobacco  . Never Used    History  Alcohol Use No    Family History  Problem Relation Age of Onset  . Cancer Father     intestinal    Review of Systems: The review of systems is per the HPI. He does note some numbness in his left hand and arm at night. All other systems were reviewed and are negative.  Physical Exam: BP 142/64  Pulse 60  Ht 5\' 6"  (1.676 m)  Wt 84.823 kg (187 lb)  BMI 30.18 kg/m2 Patient is an elderly white male who is in no acute distress. Skin is warm and dry. Color is normal.  HEENT is unremarkable. Normocephalic/atraumatic. PERRL. Sclera are nonicteric. Neck is supple. No masses. No JVD. At the left carotid bruit. Lungs are clear. Cardiac exam shows a regular rate and rhythm. Soft outflow murmur noted. Abdomen is soft. Extremities are without edema. Gait and ROM are intact. No gross neurologic deficits noted.  LABORATORY DATA:   Assessment / Plan:

## 2011-08-23 NOTE — Assessment & Plan Note (Signed)
He has had no symptomatic recurrence of his atrial fibrillation. His pulse is slow and regular today.

## 2011-08-23 NOTE — Patient Instructions (Signed)
We will schedule you for a nuclear stress test and carotid doppler studies.  Continue your medications.  I will see you again in 4 months.

## 2011-08-23 NOTE — Assessment & Plan Note (Signed)
Echocardiogram last year was acceptable. His exam is unchanged.

## 2011-08-25 ENCOUNTER — Ambulatory Visit (HOSPITAL_COMMUNITY): Payer: Medicare Other | Attending: Cardiovascular Disease | Admitting: Radiology

## 2011-08-25 DIAGNOSIS — I779 Disorder of arteries and arterioles, unspecified: Secondary | ICD-10-CM | POA: Insufficient documentation

## 2011-08-25 DIAGNOSIS — R079 Chest pain, unspecified: Secondary | ICD-10-CM | POA: Insufficient documentation

## 2011-08-25 DIAGNOSIS — I10 Essential (primary) hypertension: Secondary | ICD-10-CM | POA: Insufficient documentation

## 2011-08-25 DIAGNOSIS — I2510 Atherosclerotic heart disease of native coronary artery without angina pectoris: Secondary | ICD-10-CM | POA: Insufficient documentation

## 2011-08-25 DIAGNOSIS — Z9861 Coronary angioplasty status: Secondary | ICD-10-CM | POA: Insufficient documentation

## 2011-08-25 DIAGNOSIS — R0602 Shortness of breath: Secondary | ICD-10-CM

## 2011-08-25 DIAGNOSIS — Z87891 Personal history of nicotine dependence: Secondary | ICD-10-CM | POA: Insufficient documentation

## 2011-08-25 DIAGNOSIS — Z954 Presence of other heart-valve replacement: Secondary | ICD-10-CM | POA: Insufficient documentation

## 2011-08-25 DIAGNOSIS — R42 Dizziness and giddiness: Secondary | ICD-10-CM | POA: Insufficient documentation

## 2011-08-25 DIAGNOSIS — E785 Hyperlipidemia, unspecified: Secondary | ICD-10-CM | POA: Insufficient documentation

## 2011-08-25 DIAGNOSIS — R0989 Other specified symptoms and signs involving the circulatory and respiratory systems: Secondary | ICD-10-CM | POA: Insufficient documentation

## 2011-08-25 DIAGNOSIS — Z951 Presence of aortocoronary bypass graft: Secondary | ICD-10-CM | POA: Insufficient documentation

## 2011-08-25 DIAGNOSIS — Z952 Presence of prosthetic heart valve: Secondary | ICD-10-CM

## 2011-08-25 MED ORDER — TECHNETIUM TC 99M TETROFOSMIN IV KIT
10.0000 | PACK | Freq: Once | INTRAVENOUS | Status: AC | PRN
  Administered 2011-08-25: 10 via INTRAVENOUS

## 2011-08-25 MED ORDER — TECHNETIUM TC 99M TETROFOSMIN IV KIT
30.0000 | PACK | Freq: Once | INTRAVENOUS | Status: AC | PRN
  Administered 2011-08-25: 30 via INTRAVENOUS

## 2011-08-25 MED ORDER — REGADENOSON 0.4 MG/5ML IV SOLN
0.4000 mg | Freq: Once | INTRAVENOUS | Status: AC
Start: 2011-08-25 — End: 2011-08-25
  Administered 2011-08-25: 0.4 mg via INTRAVENOUS

## 2011-08-25 NOTE — Progress Notes (Signed)
[facility] SITE 3 NUCLEAR MED [address] [place] [address] [phone]  Cardiology Nuclear Med Study  [name] is a 78 y.o. male     MRN : [id number]     DOB: 12/24/1924  Procedure Date: 08/25/2011  Nuclear Med Background Indication for Stress Test:  Evaluation for Ischemia and Graft Patency History:  '92 Angioplasty RCA, 2007 MPS: EF: 61%, 2008, Heart Cath: 1 graft patent, 1 graft with severe stenosis severe AS EF: 60% 2009, CABG:x2  With AVR, 9/12 ECHO: 55-60% Cardiac Risk Factors: Carotid Disease, History of Smoking, Hypertension and Lipids  Symptoms:  Chest Pain   Nuclear Pre-Procedure Caffeine/Decaff Intake:  None NPO After: 9:30pm   Lungs:  clear O2 Sat: 97% on room air. IV 0.9% NS with Angio Cath:  22g  IV Site: R Antecubital  IV Started by:  Bonnita Levan, RN  Chest Size (in):  44 Cup Size: n/a  Height: 5\' 6"  (1.676 m)  Weight:  186 lb (84.369 kg)  BMI:  Body mass index is 30.02 kg/(m^2). Tech Comments:  Patient held Lopressor this AM    Nuclear Med Study 1 or 2 day study: 1 day  Stress Test Type:  Lexiscan  Reading MD: Kristeen Miss, MD  Order Authorizing Provider:  Peter Swaziland, MD  Resting Radionuclide: Technetium 64m Tetrofosmin  Resting Radionuclide Dose: 11.0 mCi   Stress Radionuclide:  Technetium 66m Tetrofosmin  Stress Radionuclide Dose: 33.0 mCi           Stress Protocol Rest HR: 64 Stress HR: 78  Rest BP: 142/69 Stress BP: 147/53  Exercise Time (min): n/a METS: n/a   Predicted Max HR: 135 bpm % Max HR: 57.78 bpm Rate Pressure Product: 78469   Dose of Adenosine (mg):  n/a Dose of Lexiscan: 0.4 mg  Dose of Atropine (mg): n/a Dose of Dobutamine: n/a mcg/kg/min (at max HR)  Stress Test Technologist: Milana Na, EMT-P  Nuclear Technologist:  Domenic Polite, CNMT     Rest Procedure:  Myocardial perfusion imaging was performed at rest 45 minutes following the intravenous administration of Technetium 46m  Tetrofosmin. Rest ECG: NSR - Normal EKG  Stress Procedure:  The patient received IV Lexiscan 0.4 mg over 15-seconds.  Technetium 32m Tetrofosmin injected at 30-seconds.  There were + significant changes with Lexiscan.  Quantitative spect images were obtained after a 45 minute delay. Stress ECG: No significant change from baseline ECG  QPS Raw Data Images:  Normal; no motion artifact; normal heart/lung ratio. Stress Images:  Normal homogeneous uptake in all areas of the myocardium. Rest Images:  Normal homogeneous uptake in all areas of the myocardium. Subtraction (SDS):  No evidence of ischemia. Transient Ischemic Dilatation (Normal <1.22):  0.96 Lung/Heart Ratio (Normal <0.45):  0.30  Quantitative Gated Spect Images QGS EDV:  115 ml QGS ESV:  38 ml  Impression Exercise Capacity:  Lexiscan with no exercise. BP Response:  Normal blood pressure response. Clinical Symptoms:  No significant symptoms noted. ECG Impression:  There were mild nonspecific ST depression in the inferior leads.  These changes resolved very quickly. Comparison with Prior Nuclear Study: No images to compare  Overall Impression:  Normal stress nuclear study.  No evidence of ischemia.  Normal LV function.  LV Ejection Fraction: 67%.  LV Wall Motion:  NL LV Function; NL Wall Motion.    Vesta Mixer, Montez Hageman., MD, [facility] 08/25/2011, 5:31 PM

## 2011-08-29 ENCOUNTER — Encounter (INDEPENDENT_AMBULATORY_CARE_PROVIDER_SITE_OTHER): Payer: Medicare Other

## 2011-08-29 DIAGNOSIS — R42 Dizziness and giddiness: Secondary | ICD-10-CM

## 2011-08-29 DIAGNOSIS — R0989 Other specified symptoms and signs involving the circulatory and respiratory systems: Secondary | ICD-10-CM

## 2011-08-29 DIAGNOSIS — Z952 Presence of prosthetic heart valve: Secondary | ICD-10-CM

## 2011-08-29 DIAGNOSIS — I2510 Atherosclerotic heart disease of native coronary artery without angina pectoris: Secondary | ICD-10-CM

## 2011-08-29 DIAGNOSIS — I6529 Occlusion and stenosis of unspecified carotid artery: Secondary | ICD-10-CM

## 2011-08-31 ENCOUNTER — Telehealth: Payer: Self-pay | Admitting: Cardiology

## 2011-08-31 MED ORDER — AMLODIPINE BESY-BENAZEPRIL HCL 5-20 MG PO CAPS: 1.0000 | ORAL_CAPSULE | Freq: Two times a day (BID) | ORAL | Status: DC

## 2011-08-31 MED ORDER — METOPROLOL TARTRATE 50 MG PO TABS: 50.0000 mg | ORAL_TABLET | Freq: Two times a day (BID) | ORAL | Status: DC

## 2011-08-31 MED ORDER — ATORVASTATIN CALCIUM 80 MG PO TABS: 80.0000 mg | ORAL_TABLET | Freq: Every day | ORAL | Status: DC

## 2011-08-31 NOTE — Telephone Encounter (Signed)
Fu call Pt's wife called about test results

## 2011-08-31 NOTE — Telephone Encounter (Signed)
Patient's wife called no answer.LMTC. 

## 2011-08-31 NOTE — Telephone Encounter (Signed)
Patient's wife called was told myoview normal excellent results.

## 2011-11-24 ENCOUNTER — Other Ambulatory Visit (INDEPENDENT_AMBULATORY_CARE_PROVIDER_SITE_OTHER): Payer: Medicare Other

## 2011-11-24 DIAGNOSIS — I2510 Atherosclerotic heart disease of native coronary artery without angina pectoris: Secondary | ICD-10-CM

## 2011-11-24 DIAGNOSIS — E785 Hyperlipidemia, unspecified: Secondary | ICD-10-CM

## 2011-11-24 LAB — HEPATIC FUNCTION PANEL
ALT: 19 U/L (ref 0–53)
AST: 23 U/L (ref 0–37)
Albumin: 3.9 g/dL (ref 3.5–5.2)
Alkaline Phosphatase: 52 U/L (ref 39–117)
Bilirubin, Direct: 0.1 mg/dL (ref 0.0–0.3)
Total Bilirubin: 0.7 mg/dL (ref 0.3–1.2)
Total Protein: 7.2 g/dL (ref 6.0–8.3)

## 2011-11-24 LAB — LIPID PANEL
HDL: 43.4 mg/dL (ref >39.00)
LDL Cholesterol: 75 mg/dL (ref 0–99)
Total CHOL/HDL Ratio: 4
VLDL: 36 mg/dL (ref 0.0–40.0)

## 2011-11-24 LAB — BASIC METABOLIC PANEL
Calcium: 9.2 mg/dL (ref 8.4–10.5)
GFR: 62.9 mL/min (ref >60.00)
Glucose, Bld: 102 mg/dL — ABNORMAL HIGH (ref 70–99)
Sodium: 140 mEq/L (ref 135–145)

## 2011-12-07 ENCOUNTER — Ambulatory Visit (INDEPENDENT_AMBULATORY_CARE_PROVIDER_SITE_OTHER): Payer: Medicare Other | Admitting: Cardiology

## 2011-12-07 ENCOUNTER — Encounter: Payer: Self-pay | Admitting: Cardiology

## 2011-12-07 VITALS — BP 140/62 | HR 65 | Ht 66.0 in | Wt 185.0 lb

## 2011-12-07 DIAGNOSIS — Z954 Presence of other heart-valve replacement: Secondary | ICD-10-CM

## 2011-12-07 DIAGNOSIS — Z952 Presence of prosthetic heart valve: Secondary | ICD-10-CM

## 2011-12-07 DIAGNOSIS — I2510 Atherosclerotic heart disease of native coronary artery without angina pectoris: Secondary | ICD-10-CM

## 2011-12-07 DIAGNOSIS — Z953 Presence of xenogenic heart valve: Secondary | ICD-10-CM

## 2011-12-07 DIAGNOSIS — E785 Hyperlipidemia, unspecified: Secondary | ICD-10-CM

## 2011-12-07 DIAGNOSIS — I10 Essential (primary) hypertension: Secondary | ICD-10-CM

## 2011-12-07 MED ORDER — AMLODIPINE BESY-BENAZEPRIL HCL 5-20 MG PO CAPS: 1.0000 | ORAL_CAPSULE | Freq: Two times a day (BID) | ORAL | Status: DC

## 2011-12-07 MED ORDER — ATORVASTATIN CALCIUM 80 MG PO TABS: 80.0000 mg | ORAL_TABLET | Freq: Every day | ORAL | Status: DC

## 2011-12-07 NOTE — Patient Instructions (Addendum)
Continue your current medication  Try and eliminate sodium and starches from your diet  I will see you again in 6 months

## 2011-12-07 NOTE — Assessment & Plan Note (Signed)
His sounds normal. His last echocardiogram last year was acceptable. We will continue with his current therapy.

## 2011-12-07 NOTE — Assessment & Plan Note (Signed)
His Myoview study in April of this year was normal. He really is having no significant anginal symptoms. We'll continue with risk factor modification and continue with his current medications including metoprolol, aspirin, Norvasc, and statin therapy.

## 2011-12-07 NOTE — Progress Notes (Signed)
[name] Date of Birth: [birth year] Medical Record #[id number]  History of Present Illness: [name] is seen back today for a followup visit. He is getting along pretty well. He has had some mild edema but admits that he's been eating butter popcorn. He also likes to use aloe with a lot of processed meats and salad dressing. He does complain that his back and calves have been hurting a fair amount with walking. This despite the fact that he has put up 35 quarts of beans and corn that he has picked from his garden. He has had only one episode of chest discomfort when he was out weed eating in the hot sun. This did resolve with nitroglycerin. He continues to mow 8 acres of grass.  Current Outpatient Prescriptions on File Prior to Visit  Medication Sig Dispense Refill  . aspirin (ASPIRIN CHILDRENS) 81 MG chewable tablet Chew 1 tablet (81 mg total) by mouth daily.  36 tablet  11  . IBUPROFEN PO Take by mouth as needed.      . nitroGLYCERIN (NITROSTAT) 0.4 MG SL tablet Place 0.4 mg under the tongue every 5 (five) minutes as needed.        Marland Kitchen DISCONTD: amLODipine-benazepril (LOTREL) 5-20 MG per capsule Take 1 capsule by mouth 2 (two) times daily.  180 capsule  3  . DISCONTD: atorvastatin (LIPITOR) 80 MG tablet Take 1 tablet (80 mg total) by mouth daily.  90 tablet  3  . DISCONTD: metoprolol (LOPRESSOR) 50 MG tablet Take 1 tablet (50 mg total) by mouth 2 (two) times daily.  180 tablet  3    Allergies  Allergen Reactions  . Codeine   . Penicillins     Past Medical History  Diagnosis Date  . Hypertension   . Hyperlipidemia   . Coronary artery disease     s/p CABG & redo CABG with AVR  . Status post aortic valve replacement with tissue     for aortic stenosis  . Carpal tunnel syndrome   . Meningitis   . PAF (paroxysmal atrial fibrillation) Sept 2012    converted spontaneously  . PUD (peptic ulcer disease)     Remote history  . Carotid arterial disease     Doppler in 2003 with 40  to 60% L ICA  . Normal echocardiogram Sept 2012    Has normal EF, mild AS and moderate LAE    Past Surgical History  Procedure Date  . Coronary artery bypass graft   . Cardiac catheterization 04/2007    EF 60%/severe two-vessel obstructive coronary artery disease/patent saphenous bein graft to rt coronary/ severe stenosis at the ostium of the lt internal mammary artery graft to the abtuse marginal branch/normal lt ventricular function/ severe aortic stenosis/normal rt heart pressures  . Cardiac catheterization 07/25/2002    EF 65%/two-vessel obstructive atherosclerotic coronary artery disease/ patent saphenous vein graft to the distal rt coronary arter//patent lt internal mammary artery graft to the obtuse marginal branch/normal lt ventricular function  . Cardiac catheterization 05/17/1991    EF65%/two-vessel ovstructive atherosclerotic coronary artery disease/good lt ventricular performance/compared with previous catheterization there is now total occlusion of the tr coronary artery at the prior angioplasty site/good collateral flow to the distal rt coronary artery is seen/there is also progressive disease in the lt circumflex vessel now to obstructive levels  . Cardiac catheterization 07/26/1990    EF60%/borderline obstructive coronary artery disease in the mid lt circumflex & in the rt coronary artery prior angioplasty site/normal lt ventricular  function  . Coronary angioplasty 07/12/1990    single vessel obstructive atherosclerotic coronary atrery disease in the rt corornary artery with restenosis of the primary angioplasty site/successful repeat PTCA of the proximal rt coronary arter  . Cardiac catheterization 05/23/1990    successful percutaneous transluminal coronary angioplasty of the proximal rt coronary artery  . Hemorrhoid surgery     at age 25  . Tonsillectomy     at age 54  . Coronary artery bypass graft     x2 using a reverse saphenous vein graft to posterior descending, reseverse  saphenous vein graft to lt internal mammary artery at the previous bypass to the circumflex with a no vein harvesting  . Penile prosthesis implant 1989  . Sternotomy     redo median with aortic valve replacement with a pericardial tissue valve/[employer] model 3000,92mm,serial numver [id number]  . Vasectomy     at age 46  . Inguinal hernia repair 1967    History  Smoking status  . Former Smoker -- 1.5 packs/day for 41 years  . Types: Cigarettes, Cigars  . Quit date: 05/02/1982  Smokeless tobacco  . Never Used    History  Alcohol Use No    Family History  Problem Relation Age of Onset  . Cancer Father     intestinal    Review of Systems: The review of systems is per the HPI.  All other systems were reviewed and are negative.  Physical Exam: BP 140/62  Pulse 65  Ht 5\' 6"  (1.676 m)  Wt 185 lb (83.915 kg)  BMI 29.86 kg/m2  SpO2 97% Patient is an elderly white male who is in no acute distress. Skin is warm and dry. Color is normal.  HEENT is unremarkable. Normocephalic/atraumatic. PERRL. Sclera are nonicteric. Neck is supple. No masses. No JVD. Soft left carotid bruit. Lungs are clear. Cardiac exam shows a regular rate and rhythm. Soft outflow murmur noted. Abdomen is soft. Extremities are with 1+ edema. Pedal pulses are good. Gait and ROM are intact. No gross neurologic deficits noted.  LABORATORY DATA: Stress Myoview study in April was normal with an ejection fraction of 67%. Recent complete chemistries were normal. Total cholesterol was 154, triglycerides 180, HDL 43, LDL 75.  Assessment / Plan:

## 2011-12-07 NOTE — Assessment & Plan Note (Signed)
His lipids appear to be under good control now on Lipitor. I have reviewed his prescription today.

## 2011-12-07 NOTE — Assessment & Plan Note (Signed)
Blood pressure is under good control today. We will continue with Lotrel and metoprolol. He states he cannot tolerate taking metoprolol twice a day. I stressed the importance of low sodium diet.

## 2012-06-28 ENCOUNTER — Ambulatory Visit (INDEPENDENT_AMBULATORY_CARE_PROVIDER_SITE_OTHER): Payer: Medicare Other | Admitting: Cardiology

## 2012-06-28 ENCOUNTER — Encounter: Payer: Self-pay | Admitting: Cardiology

## 2012-06-28 VITALS — BP 134/58 | HR 73 | Ht 66.0 in | Wt 185.4 lb

## 2012-06-28 DIAGNOSIS — I4891 Unspecified atrial fibrillation: Secondary | ICD-10-CM

## 2012-06-28 DIAGNOSIS — Z954 Presence of other heart-valve replacement: Secondary | ICD-10-CM

## 2012-06-28 DIAGNOSIS — I2510 Atherosclerotic heart disease of native coronary artery without angina pectoris: Secondary | ICD-10-CM

## 2012-06-28 DIAGNOSIS — Z953 Presence of xenogenic heart valve: Secondary | ICD-10-CM

## 2012-06-28 DIAGNOSIS — I10 Essential (primary) hypertension: Secondary | ICD-10-CM

## 2012-06-28 DIAGNOSIS — E785 Hyperlipidemia, unspecified: Secondary | ICD-10-CM

## 2012-06-28 NOTE — Patient Instructions (Addendum)
Continue your current therapy  I will see you in 6 months with fasting labs  

## 2012-06-28 NOTE — Progress Notes (Addendum)
[name] Date of Birth: Oct 06, 1924 Medical Record #[id number]  History of Present Illness: Mr. [name] is seen back today for a followup visit. He reports he is doing well. He does note that he has to stop and rest more now with activity. He gets short of breath and tired. He denies any significant chest pain. He is trying to use more healthy but still eats a fair amount of sodium. He denies any increase in edema.  Current Outpatient Prescriptions on File Prior to Visit  Medication Sig Dispense Refill  . amLODipine-benazepril (LOTREL) 5-20 MG per capsule Take 1 capsule by mouth 2 (two) times daily.  180 capsule  3  . aspirin (ASPIRIN CHILDRENS) 81 MG chewable tablet Chew 1 tablet (81 mg total) by mouth daily.  36 tablet  11  . atorvastatin (LIPITOR) 80 MG tablet Take 1 tablet (80 mg total) by mouth daily.  90 tablet  3  . IBUPROFEN PO Take by mouth as needed.      . metoprolol (LOPRESSOR) 50 MG tablet Take 50 mg by mouth daily.      . nitroGLYCERIN (NITROSTAT) 0.4 MG SL tablet Place 0.4 mg under the tongue every 5 (five) minutes as needed.         No current facility-administered medications on file prior to visit.    Allergies  Allergen Reactions  . Codeine   . Penicillins     Past Medical History  Diagnosis Date  . Hypertension   . Hyperlipidemia   . Coronary artery disease     s/p CABG & redo CABG with AVR  . Status post aortic valve replacement with tissue     for aortic stenosis  . Carpal tunnel syndrome   . Meningitis   . PAF (paroxysmal atrial fibrillation) Sept 2012    converted spontaneously  . PUD (peptic ulcer disease)     Remote history  . Carotid arterial disease     Doppler in 2003 with 40 to 60% L ICA  . Normal echocardiogram Sept 2012    Has normal EF, mild AS and moderate LAE    Past Surgical History  Procedure Laterality Date  . Coronary artery bypass graft    . Cardiac catheterization  04/2007    EF 60%/severe two-vessel obstructive  coronary artery disease/patent saphenous bein graft to rt coronary/ severe stenosis at the ostium of the lt internal mammary artery graft to the abtuse marginal branch/normal lt ventricular function/ severe aortic stenosis/normal rt heart pressures  . Cardiac catheterization  07/25/2002    EF 65%/two-vessel obstructive atherosclerotic coronary artery disease/ patent saphenous vein graft to the distal rt coronary arter//patent lt internal mammary artery graft to the obtuse marginal branch/normal lt ventricular function  . Cardiac catheterization  05/17/1991    EF65%/two-vessel ovstructive atherosclerotic coronary artery disease/good lt ventricular performance/compared with previous catheterization there is now total occlusion of the tr coronary artery at the prior angioplasty site/good collateral flow to the distal rt coronary artery is seen/there is also progressive disease in the lt circumflex vessel now to obstructive levels  . Cardiac catheterization  07/26/1990    EF60%/borderline obstructive coronary artery disease in the mid lt circumflex & in the rt coronary artery prior angioplasty site/normal lt ventricular function  . Coronary angioplasty  07/12/1990    single vessel obstructive atherosclerotic coronary atrery disease in the rt corornary artery with restenosis of the primary angioplasty site/successful repeat PTCA of the proximal rt coronary arter  . Cardiac catheterization  05/23/1990  successful percutaneous transluminal coronary angioplasty of the proximal rt coronary artery  . Hemorrhoid surgery      at age 89  . Tonsillectomy      at age 82  . Coronary artery bypass graft      x2 using a reverse saphenous vein graft to posterior descending, reseverse saphenous vein graft to lt internal mammary artery at the previous bypass to the circumflex with a no vein harvesting  . Penile prosthesis implant  1989  . Sternotomy      redo median with aortic valve replacement with a pericardial  tissue valve/[employer] model 3000,4mm,serial numver [id number]  . Vasectomy      at age 35  . Inguinal hernia repair  1967    History  Smoking status  . Former Smoker -- 1.50 packs/day for 41 years  . Types: Cigarettes, Cigars  . Quit date: 05/02/1982  Smokeless tobacco  . Never Used    History  Alcohol Use No    Family History  Problem Relation Age of Onset  . Cancer Father     intestinal    Review of Systems: The review of systems is per the HPI.  All other systems were reviewed and are negative.  Physical Exam: BP 134/58  Pulse 73  Ht 5\' 6"  (1.676 m)  Wt 185 lb 6.4 oz (84.097 kg)  BMI 29.94 kg/m2  SpO2 98% Patient is an elderly white male who is in no acute distress. Skin is warm and dry. Color is normal.  HEENT is unremarkable. Normocephalic/atraumatic. PERRL. Sclera are nonicteric. Neck is supple. No masses. No JVD. Soft left carotid bruit. Lungs are clear. Cardiac exam shows a regular rate and rhythm. Soft outflow murmur noted. Abdomen is soft. Extremities are with trace edema. Pedal pulses are good. Gait and ROM are intact. No gross neurologic deficits noted.  LABORATORY DATA: ECG demonstrates normal sinus rhythm with a normal ECG.  Assessment / Plan: 1. Coronary disease status post redo CABG with aortic valve replacement with a pericardial tissue valve. Myoview study in may of 2013 was normal. He has no active anginal symptoms. We will continue risk factor modification. Continue aspirin, metoprolol, and amlodipine.  2. Hyperlipidemia. Well controlled on Lipitor. We will check fasting lab work in 6 months.  3. Hypertension. Blood pressure is well controlled.  4. Status post tissue aortic valve replacement. Stable by exam. Last echocardiogram in 2012 was acceptable.

## 2012-08-30 ENCOUNTER — Other Ambulatory Visit: Payer: Self-pay

## 2012-08-30 MED ORDER — METOPROLOL TARTRATE 50 MG PO TABS: 50.0000 mg | ORAL_TABLET | Freq: Every day | ORAL | Status: DC

## 2012-08-30 NOTE — Telephone Encounter (Signed)
Assessment / Plan: 1. Coronary disease status post redo CABG with aortic valve replacement with a pericardial tissue valve. Myoview study in may of 2013 was normal. He has no active anginal symptoms. We will continue risk factor modification. Continue aspirin, metoprolol, and amlodipine.  Patient Instructions  Continue your current therapy  I will see you in 6 months with fasting labs.  Patient Instructions History Recorded     Chart Reviewed By  Charna Elizabeth, LPN  on 1/61/0960  3:57 PM     Previous Visit      Provider Department Encounter #    12/07/2011  8:45 AM Peter Swaziland, MD [facility] [id number]

## 2012-09-03 ENCOUNTER — Encounter (INDEPENDENT_AMBULATORY_CARE_PROVIDER_SITE_OTHER): Payer: Medicare Other

## 2012-09-03 DIAGNOSIS — R42 Dizziness and giddiness: Secondary | ICD-10-CM

## 2012-09-03 DIAGNOSIS — R209 Unspecified disturbances of skin sensation: Secondary | ICD-10-CM

## 2012-09-03 DIAGNOSIS — I6529 Occlusion and stenosis of unspecified carotid artery: Secondary | ICD-10-CM

## 2012-09-10 ENCOUNTER — Other Ambulatory Visit: Payer: Self-pay

## 2012-12-07 ENCOUNTER — Other Ambulatory Visit: Payer: Self-pay

## 2012-12-07 MED ORDER — ATORVASTATIN CALCIUM 80 MG PO TABS: 80.0000 mg | ORAL_TABLET | Freq: Every day | ORAL | Status: DC

## 2012-12-07 MED ORDER — AMLODIPINE BESY-BENAZEPRIL HCL 5-20 MG PO CAPS: 1.0000 | ORAL_CAPSULE | Freq: Two times a day (BID) | ORAL | Status: DC

## 2012-12-07 NOTE — Telephone Encounter (Signed)
Peter M Swaziland, MD at 06/28/2012  3:34 PM  amLODipine-benazepril (LOTREL) 5-20 MG per capsule  Take 1 capsule by mouth 2 (two) times daily.   180 capsule   atorvastatin (LIPITOR) 80 MG tablet  Take 1 tablet (80 mg total) by mouth daily.   90 tablet   3   Patient Instructions:  Continue your current therapy I will see you in 6 months with fasting labs.

## 2012-12-12 ENCOUNTER — Ambulatory Visit (INDEPENDENT_AMBULATORY_CARE_PROVIDER_SITE_OTHER): Payer: Medicare Other | Admitting: Cardiology

## 2012-12-12 ENCOUNTER — Encounter: Payer: Self-pay | Admitting: Cardiology

## 2012-12-12 VITALS — BP 140/62 | HR 64 | Ht 66.0 in | Wt 184.2 lb

## 2012-12-12 DIAGNOSIS — I2510 Atherosclerotic heart disease of native coronary artery without angina pectoris: Secondary | ICD-10-CM

## 2012-12-12 DIAGNOSIS — Z952 Presence of prosthetic heart valve: Secondary | ICD-10-CM

## 2012-12-12 DIAGNOSIS — I739 Peripheral vascular disease, unspecified: Secondary | ICD-10-CM

## 2012-12-12 DIAGNOSIS — I4891 Unspecified atrial fibrillation: Secondary | ICD-10-CM

## 2012-12-12 DIAGNOSIS — E785 Hyperlipidemia, unspecified: Secondary | ICD-10-CM

## 2012-12-12 DIAGNOSIS — Z953 Presence of xenogenic heart valve: Secondary | ICD-10-CM

## 2012-12-12 DIAGNOSIS — I10 Essential (primary) hypertension: Secondary | ICD-10-CM

## 2012-12-12 LAB — CBC WITH DIFFERENTIAL/PLATELET
Basophils Relative: 0.6 % (ref 0.0–3.0)
Eosinophils Absolute: 0.1 10*3/uL (ref 0.0–0.7)
HCT: 36.3 % — ABNORMAL LOW (ref 39.0–52.0)
Lymphs Abs: 2.1 10*3/uL (ref 0.7–4.0)
MCHC: 32.8 g/dL (ref 30.0–36.0)
MCV: 86.5 fl (ref 78.0–100.0)
Monocytes Absolute: 0.6 10*3/uL (ref 0.1–1.0)
Neutro Abs: 4.6 10*3/uL (ref 1.4–7.7)
Neutrophils Relative %: 61.7 % (ref 43.0–77.0)
RBC: 4.19 Mil/uL — ABNORMAL LOW (ref 4.22–5.81)

## 2012-12-12 LAB — HEPATIC FUNCTION PANEL
ALT: 18 U/L (ref 0–53)
Alkaline Phosphatase: 54 U/L (ref 39–117)
Bilirubin, Direct: 0.1 mg/dL (ref 0.0–0.3)
Total Bilirubin: 1 mg/dL (ref 0.3–1.2)
Total Protein: 7.3 g/dL (ref 6.0–8.3)

## 2012-12-12 LAB — BASIC METABOLIC PANEL
BUN: 26 mg/dL — ABNORMAL HIGH (ref 6–23)
Calcium: 9.2 mg/dL (ref 8.4–10.5)
Creatinine, Ser: 1.3 mg/dL (ref 0.4–1.5)
GFR: 54.59 mL/min — ABNORMAL LOW (ref >60.00)

## 2012-12-12 LAB — LIPID PANEL: Cholesterol: 156 mg/dL (ref 0–200)

## 2012-12-12 NOTE — Addendum Note (Signed)
Addended by: Tonita Phoenix on: 12/12/2012 10:13 AM   Modules accepted: Orders

## 2012-12-12 NOTE — Progress Notes (Signed)
[name] Date of Birth: May 17, 1924 Medical Record #[id number]  History of Present Illness: [name] is seen back today for a followup visit. He reports he is doing well. He reports he is doing fairly well. He does have occasional chest heaviness but he hasn't taken any nitroglycerin for this and he states it doesn't last very long. He does complain of pain on the bottom of his feet. He also notes that his calves give out when he walks uphill. He has had no sores on his feet. He denies any shortness of breath.  Current Outpatient Prescriptions on File Prior to Visit  Medication Sig Dispense Refill  . amLODipine-benazepril (LOTREL) 5-20 MG per capsule Take 1 capsule by mouth 2 (two) times daily.  180 capsule  2  . aspirin (ASPIRIN CHILDRENS) 81 MG chewable tablet Chew 1 tablet (81 mg total) by mouth daily.  36 tablet  11  . atorvastatin (LIPITOR) 80 MG tablet Take 1 tablet (80 mg total) by mouth daily.  90 tablet  2  . IBUPROFEN PO Take by mouth as needed.      . metoprolol (LOPRESSOR) 50 MG tablet Take 1 tablet (50 mg total) by mouth daily.  90 tablet  3  . nitroGLYCERIN (NITROSTAT) 0.4 MG SL tablet Place 0.4 mg under the tongue every 5 (five) minutes as needed.         No current facility-administered medications on file prior to visit.    Allergies  Allergen Reactions  . Codeine   . Penicillins     Past Medical History  Diagnosis Date  . Hypertension   . Hyperlipidemia   . Coronary artery disease     s/p CABG & redo CABG with AVR  . Status post aortic valve replacement with tissue     for aortic stenosis  . Carpal tunnel syndrome   . Meningitis   . PAF (paroxysmal atrial fibrillation) Sept 2012    converted spontaneously  . PUD (peptic ulcer disease)     Remote history  . Carotid arterial disease     Doppler in 2003 with 40 to 60% L ICA  . Normal echocardiogram Sept 2012    Has normal EF, mild AS and moderate LAE    Past Surgical History  Procedure Laterality Date   . Coronary artery bypass graft    . Cardiac catheterization  04/2007    EF 60%/severe two-vessel obstructive coronary artery disease/patent saphenous bein graft to rt coronary/ severe stenosis at the ostium of the lt internal mammary artery graft to the abtuse marginal branch/normal lt ventricular function/ severe aortic stenosis/normal rt heart pressures  . Cardiac catheterization  07/25/2002    EF 65%/two-vessel obstructive atherosclerotic coronary artery disease/ patent saphenous vein graft to the distal rt coronary arter//patent lt internal mammary artery graft to the obtuse marginal branch/normal lt ventricular function  . Cardiac catheterization  05/17/1991    EF65%/two-vessel ovstructive atherosclerotic coronary artery disease/good lt ventricular performance/compared with previous catheterization there is now total occlusion of the tr coronary artery at the prior angioplasty site/good collateral flow to the distal rt coronary artery is seen/there is also progressive disease in the lt circumflex vessel now to obstructive levels  . Cardiac catheterization  07/26/1990    EF60%/borderline obstructive coronary artery disease in the mid lt circumflex & in the rt coronary artery prior angioplasty site/normal lt ventricular function  . Coronary angioplasty  07/12/1990    single vessel obstructive atherosclerotic coronary atrery disease in the rt corornary artery with  restenosis of the primary angioplasty site/successful repeat PTCA of the proximal rt coronary arter  . Cardiac catheterization  05/23/1990    successful percutaneous transluminal coronary angioplasty of the proximal rt coronary artery  . Hemorrhoid surgery      at age 72  . Tonsillectomy      at age 40  . Coronary artery bypass graft      x2 using a reverse saphenous vein graft to posterior descending, reseverse saphenous vein graft to lt internal mammary artery at the previous bypass to the circumflex with a no vein harvesting  . Penile  prosthesis implant  1989  . Sternotomy      redo median with aortic valve replacement with a pericardial tissue valve/[employer] model 3000,89mm,serial numver [id number]  . Vasectomy      at age 89  . Inguinal hernia repair  1967    History  Smoking status  . Former Smoker -- 1.50 packs/day for 41 years  . Types: Cigarettes, Cigars  . Quit date: 05/02/1982  Smokeless tobacco  . Never Used    History  Alcohol Use No    Family History  Problem Relation Age of Onset  . Cancer Father     intestinal    Review of Systems: The review of systems is per the HPI.  All other systems were reviewed and are negative.  Physical Exam: BP 140/62  Pulse 64  Ht 5\' 6"  (1.676 m)  Wt 184 lb 3.2 oz (83.553 kg)  BMI 29.75 kg/m2  SpO2 98% Patient is an elderly white male who is in no acute distress. Skin is warm and dry. Color is normal.  HEENT is unremarkable. Normocephalic/atraumatic. PERRL. Sclera are nonicteric. Neck is supple. No masses. No JVD. Soft left carotid bruit. Lungs are clear. Cardiac exam shows a regular rate and rhythm. Soft outflow murmur noted. Abdomen is soft. Extremities are with trace edema. Pedal pulses are reduced. Gait and ROM are intact. No gross neurologic deficits noted.  LABORATORY DATA: ECG demonstrates normal sinus rhythm with nonspecific T-wave abnormality.  Assessment / Plan: 1. Coronary disease status post redo CABG with aortic valve replacement with a pericardial tissue valve. Myoview study in May of 2013 was normal. He has mild anginal symptoms. We will continue risk factor modification. Continue aspirin, metoprolol, and amlodipine.  2. Hyperlipidemia. Well controlled on Lipitor. We will check fasting lab today.  3. Hypertension. Blood pressure is well controlled.  4. Status post tissue aortic valve replacement. Stable by exam. Last echocardiogram in 2012 was acceptable.  5. Claudication. We'll schedule for lower extremity arterial Dopplers.

## 2012-12-12 NOTE — Patient Instructions (Signed)
We will check lab work today.  We will schedule you for dopplers of your legs to check the circulation.  I will see you in 6 months.

## 2012-12-13 ENCOUNTER — Encounter (INDEPENDENT_AMBULATORY_CARE_PROVIDER_SITE_OTHER): Payer: Medicare Other

## 2012-12-13 DIAGNOSIS — E785 Hyperlipidemia, unspecified: Secondary | ICD-10-CM

## 2012-12-13 DIAGNOSIS — I2510 Atherosclerotic heart disease of native coronary artery without angina pectoris: Secondary | ICD-10-CM

## 2012-12-13 DIAGNOSIS — I70219 Atherosclerosis of native arteries of extremities with intermittent claudication, unspecified extremity: Secondary | ICD-10-CM

## 2012-12-13 DIAGNOSIS — I739 Peripheral vascular disease, unspecified: Secondary | ICD-10-CM

## 2012-12-13 DIAGNOSIS — Z953 Presence of xenogenic heart valve: Secondary | ICD-10-CM

## 2012-12-13 DIAGNOSIS — I4891 Unspecified atrial fibrillation: Secondary | ICD-10-CM

## 2012-12-13 DIAGNOSIS — I10 Essential (primary) hypertension: Secondary | ICD-10-CM

## 2013-05-14 ENCOUNTER — Ambulatory Visit (INDEPENDENT_AMBULATORY_CARE_PROVIDER_SITE_OTHER): Payer: Medicare Other | Admitting: Cardiology

## 2013-05-14 ENCOUNTER — Encounter: Payer: Self-pay | Admitting: Cardiology

## 2013-05-14 VITALS — BP 130/62 | HR 62 | Ht 66.0 in | Wt 188.1 lb

## 2013-05-14 DIAGNOSIS — I10 Essential (primary) hypertension: Secondary | ICD-10-CM

## 2013-05-14 DIAGNOSIS — Z952 Presence of prosthetic heart valve: Secondary | ICD-10-CM

## 2013-05-14 DIAGNOSIS — E785 Hyperlipidemia, unspecified: Secondary | ICD-10-CM

## 2013-05-14 DIAGNOSIS — I2510 Atherosclerotic heart disease of native coronary artery without angina pectoris: Secondary | ICD-10-CM

## 2013-05-14 DIAGNOSIS — Z953 Presence of xenogenic heart valve: Secondary | ICD-10-CM

## 2013-05-14 NOTE — Progress Notes (Signed)
Arna SnipeAvery V Tuckerman Date of Birth: [birth year] Medical Record #[id number]  History of Present Illness: [name] is seen back today for a followup visit. He is s/p redo CABG and AVR in 2008. He reports he is doing well. 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 Doesn't do much aerobic activity. Gained 4 lbs over the holidays. Denies any chest pain or SOB. Has chronic leg pain in the back of his legs. Arterial dopplers were OK. Dr. Andi Devonloward stopped his metoprolol because of complaints of feeling "drunk". These symptoms have improved.  Current Outpatient Prescriptions on File Prior to Visit  Medication Sig Dispense Refill  . amLODipine-benazepril (LOTREL) 5-20 MG per capsule Take 1 capsule by mouth 2 (two) times daily.  180 capsule  2  . aspirin (ASPIRIN CHILDRENS) 81 MG chewable tablet Chew 1 tablet (81 mg total) by mouth daily.  36 tablet  11  . atorvastatin (LIPITOR) 80 MG tablet Take 1 tablet (80 mg total) by mouth daily.  90 tablet  2  . COMBIGAN 0.2-0.5 % ophthalmic solution       . IBUPROFEN PO Take by mouth as needed.      . nitroGLYCERIN (NITROSTAT) 0.4 MG SL tablet Place 0.4 mg under the tongue every 5 (five) minutes as needed.        . TRAVATAN Z 0.004 % SOLN ophthalmic solution        No current facility-administered medications on file prior to visit.    Allergies  Allergen Reactions  . Codeine   . Penicillins     Past Medical History  Diagnosis Date  . Hypertension   . Hyperlipidemia   . Coronary artery disease     s/p CABG & redo CABG with AVR  . Status post aortic valve replacement with tissue     for aortic stenosis  . Carpal tunnel syndrome   . Meningitis   . PAF (paroxysmal atrial fibrillation) Sept 2012    converted spontaneously  . PUD (peptic ulcer disease)     Remote history  . Carotid arterial disease     Doppler in 2003 with 40 to 60% L ICA  . Normal echocardiogram Sept 2012    Has normal EF, mild AS and moderate LAE    Past Surgical  History  Procedure Laterality Date  . Coronary artery bypass graft    . Cardiac catheterization  04/2007    EF 60%/severe two-vessel obstructive coronary artery disease/patent saphenous bein graft to rt coronary/ severe stenosis at the ostium of the lt internal mammary artery graft to the abtuse marginal branch/normal lt ventricular function/ severe aortic stenosis/normal rt heart pressures  . Cardiac catheterization  07/25/2002    EF 65%/two-vessel obstructive atherosclerotic coronary artery disease/ patent saphenous vein graft to the distal rt coronary arter//patent lt internal mammary artery graft to the obtuse marginal branch/normal lt ventricular function  . Cardiac catheterization  05/17/1991    EF65%/two-vessel ovstructive atherosclerotic coronary artery disease/good lt ventricular performance/compared with previous catheterization there is now total occlusion of the tr coronary artery at the prior angioplasty site/good collateral flow to the distal rt coronary artery is seen/there is also progressive disease in the lt circumflex vessel now to obstructive levels  . Cardiac catheterization  07/26/1990    EF60%/borderline obstructive coronary artery disease in the mid lt circumflex & in the rt coronary artery prior angioplasty site/normal lt ventricular function  . Coronary angioplasty  07/12/1990    single vessel obstructive atherosclerotic coronary atrery disease  in the rt corornary artery with restenosis of the primary angioplasty site/successful repeat PTCA of the proximal rt coronary arter  . Cardiac catheterization  05/23/1990    successful percutaneous transluminal coronary angioplasty of the proximal rt coronary artery  . Hemorrhoid surgery      at age 80  . Tonsillectomy      at age 80  . Coronary artery bypass graft      x2 using a reverse saphenous vein graft to posterior descending, reseverse saphenous vein graft to lt internal mammary artery at the previous bypass to the circumflex  with a no vein harvesting  . Penile prosthesis implant  1989  . Sternotomy      redo median with aortic valve replacement with a pericardial tissue valve/[employer] model 3000,74mm,serial numver [id number]  . Vasectomy      at age 80  . Inguinal hernia repair  1967    History  Smoking status  . Former Smoker -- 1.50 packs/day for 41 years  . Types: Cigarettes, Cigars  . Quit date: 05/02/1982  Smokeless tobacco  . Never Used    History  Alcohol Use No    Family History  Problem Relation Age of Onset  . Cancer Father     intestinal    Review of Systems: The review of systems is per the HPI.  All other systems were reviewed and are negative.  Physical Exam: BP 130/62  Pulse 62  Ht 5\' 6"  (1.676 m)  Wt 188 lb 1.9 oz (85.331 kg)  BMI 30.38 kg/m2 Patient is an elderly white male who is in no acute distress. Skin is warm and dry. Color is normal.  HEENT is unremarkable. Normocephalic/atraumatic. PERRL. Sclera are nonicteric. Neck is supple. No masses. No JVD. Soft left carotid bruit. Lungs are clear. Cardiac exam shows a regular rate and rhythm. Soft outflow murmur noted. Abdomen is soft. Extremities are with trace edema. Pedal pulses are reduced. Gait and ROM are intact. No gross neurologic deficits noted.  LABORATORY DATA: Lab Results  Component Value Date   WBC 7.4 12/12/2012   HGB 11.9* 12/12/2012   HCT 36.3* 12/12/2012   PLT 179.0 12/12/2012   GLUCOSE 95 12/12/2012   CHOL 156 12/12/2012   TRIG 169.0* 12/12/2012   HDL 38.80* 12/12/2012   LDLCALC 83 12/12/2012   ALT 18 12/12/2012   AST 20 12/12/2012   NA 138 12/12/2012   K 4.4 12/12/2012   CL 110 12/12/2012   CREATININE 1.3 12/12/2012   BUN 26* 12/12/2012   CO2 24 12/12/2012   TSH 3.19 01/12/2011   INR 1.4 06/04/2007   HGBA1C  Value: 6.1 (NOTE)   The ADA recommends the following therapeutic goals for glycemic   control related to Hgb A1C measurement:   Goal of Therapy:   < 7.0% Hgb A1C   Action Suggested:  > 8.0% Hgb A1C    Ref:  Diabetes Care, 22, Suppl. 1, 1999 05/31/2007     Assessment / Plan: 1. Coronary disease status post redo CABG with aortic valve replacement with a pericardial tissue valve in 2008. Myoview study in May of 2013 was normal. He has no significant anginal symptoms. Fairly sedentary. We will continue risk factor modification. Continue aspirin, metoprolol, and amlodipine. Encouraged increased aerobic activity.  2. Hyperlipidemia. Well controlled on Lipitor.   3. Hypertension. Blood pressure is well controlled.  4. Status post tissue aortic valve replacement. Stable by exam. Last echocardiogram in 2012 was acceptable.  5. Leg pain with  good doppler evaluation  6. Lightheadedness- improved with stopping metoprolol

## 2013-05-14 NOTE — Patient Instructions (Signed)
Continue your current therapy  You need to walk more- preferably every day.  Keep your weight down.  I will see you in 6 months

## 2013-09-11 ENCOUNTER — Other Ambulatory Visit: Payer: Self-pay

## 2013-09-11 MED ORDER — AMLODIPINE BESY-BENAZEPRIL HCL 5-20 MG PO CAPS: 1.0000 | ORAL_CAPSULE | Freq: Two times a day (BID) | ORAL | Status: DC

## 2013-09-11 MED ORDER — ATORVASTATIN CALCIUM 80 MG PO TABS: 80.0000 mg | ORAL_TABLET | Freq: Every day | ORAL | Status: DC

## 2013-11-12 ENCOUNTER — Ambulatory Visit (INDEPENDENT_AMBULATORY_CARE_PROVIDER_SITE_OTHER): Payer: Medicare Other | Admitting: Cardiology

## 2013-11-12 ENCOUNTER — Encounter: Payer: Self-pay | Admitting: Cardiology

## 2013-11-12 VITALS — BP 140/64 | HR 70 | Ht 66.0 in | Wt 186.7 lb

## 2013-11-12 DIAGNOSIS — I10 Essential (primary) hypertension: Secondary | ICD-10-CM

## 2013-11-12 DIAGNOSIS — I2510 Atherosclerotic heart disease of native coronary artery without angina pectoris: Secondary | ICD-10-CM

## 2013-11-12 DIAGNOSIS — I25118 Atherosclerotic heart disease of native coronary artery with other forms of angina pectoris: Secondary | ICD-10-CM

## 2013-11-12 DIAGNOSIS — I779 Disorder of arteries and arterioles, unspecified: Secondary | ICD-10-CM

## 2013-11-12 DIAGNOSIS — I4891 Unspecified atrial fibrillation: Secondary | ICD-10-CM | POA: Insufficient documentation

## 2013-11-12 DIAGNOSIS — Z953 Presence of xenogenic heart valve: Secondary | ICD-10-CM

## 2013-11-12 DIAGNOSIS — I739 Peripheral vascular disease, unspecified: Secondary | ICD-10-CM

## 2013-11-12 DIAGNOSIS — Z952 Presence of prosthetic heart valve: Secondary | ICD-10-CM

## 2013-11-12 DIAGNOSIS — I482 Chronic atrial fibrillation, unspecified: Secondary | ICD-10-CM

## 2013-11-12 DIAGNOSIS — I209 Angina pectoris, unspecified: Secondary | ICD-10-CM

## 2013-11-12 LAB — HEPATIC FUNCTION PANEL
ALT: 17 U/L (ref 0–53)
AST: 19 U/L (ref 0–37)
Albumin: 4 g/dL (ref 3.5–5.2)
Alkaline Phosphatase: 56 U/L (ref 39–117)
BILIRUBIN DIRECT: 0.1 mg/dL (ref 0.0–0.3)
Indirect Bilirubin: 0.5 mg/dL (ref 0.2–1.2)
TOTAL PROTEIN: 6.3 g/dL (ref 6.0–8.3)
Total Bilirubin: 0.6 mg/dL (ref 0.2–1.2)

## 2013-11-12 LAB — CBC WITH DIFFERENTIAL/PLATELET
Basophils Absolute: 0.1 10*3/uL (ref 0.0–0.1)
Basophils Relative: 1 % (ref 0–1)
Eosinophils Absolute: 0.1 10*3/uL (ref 0.0–0.7)
Eosinophils Relative: 2 % (ref 0–5)
HCT: 32.8 % — ABNORMAL LOW (ref 39.0–52.0)
HEMOGLOBIN: 11.3 g/dL — AB (ref 13.0–17.0)
LYMPHS ABS: 2.3 10*3/uL (ref 0.7–4.0)
Lymphocytes Relative: 36 % (ref 12–46)
MCH: 27.8 pg (ref 26.0–34.0)
MCHC: 34.5 g/dL (ref 30.0–36.0)
MCV: 80.6 fL (ref 78.0–100.0)
Monocytes Absolute: 0.5 10*3/uL (ref 0.1–1.0)
Monocytes Relative: 8 % (ref 3–12)
NEUTROS PCT: 53 % (ref 43–77)
Neutro Abs: 3.4 10*3/uL (ref 1.7–7.7)
PLATELETS: 172 10*3/uL (ref 150–400)
RBC: 4.07 MIL/uL — ABNORMAL LOW (ref 4.22–5.81)
RDW: 15.6 % — ABNORMAL HIGH (ref 11.5–15.5)
WBC: 6.4 10*3/uL (ref 4.0–10.5)

## 2013-11-12 LAB — BASIC METABOLIC PANEL
BUN: 30 mg/dL — ABNORMAL HIGH (ref 6–23)
CHLORIDE: 110 meq/L (ref 96–112)
CO2: 23 mEq/L (ref 19–32)
Calcium: 9.1 mg/dL (ref 8.4–10.5)
Creat: 1.33 mg/dL (ref 0.50–1.35)
GLUCOSE: 96 mg/dL (ref 70–99)
POTASSIUM: 4.4 meq/L (ref 3.5–5.3)
Sodium: 140 mEq/L (ref 135–145)

## 2013-11-12 LAB — LIPID PANEL
Cholesterol: 137 mg/dL (ref 0–200)
HDL: 39 mg/dL — ABNORMAL LOW (ref >39)
LDL CALC: 67 mg/dL (ref 0–99)
Total CHOL/HDL Ratio: 3.5 Ratio
Triglycerides: 153 mg/dL — ABNORMAL HIGH (ref <150)
VLDL: 31 mg/dL (ref 0–40)

## 2013-11-12 MED ORDER — ATORVASTATIN CALCIUM 80 MG PO TABS: 40.0000 mg | ORAL_TABLET | Freq: Every day | ORAL | Status: DC

## 2013-11-12 NOTE — Progress Notes (Signed)
[name] Date of Birth: 12-17-1924 Medical Record #[id number]  History of Present Illness: [name] is seen back today for a followup visit. He is s/p redo CABG and AVR in 2008. He reports he is doing well. He has been working hard in his garden and states he feels tired. Denies any chest pain or SOB. Has chronic leg pain in the back of his legs. Arterial dopplers were OK last year. No TIA symptoms.  Current Outpatient Prescriptions on File Prior to Visit  Medication Sig Dispense Refill  . amLODipine-benazepril (LOTREL) 5-20 MG per capsule Take 1 capsule by mouth 2 (two) times daily.  180 capsule  2  . aspirin (ASPIRIN CHILDRENS) 81 MG chewable tablet Chew 1 tablet (81 mg total) by mouth daily.  36 tablet  11  . b complex vitamins tablet Take 1 tablet by mouth daily.      . Coenzyme Q10 (CO Q 10 PO) Take 100 mg by mouth.      . COMBIGAN 0.2-0.5 % ophthalmic solution Place 1 drop into the left eye every 12 (twelve) hours.       . IBUPROFEN PO Take by mouth as needed.      . nitroGLYCERIN (NITROSTAT) 0.4 MG SL tablet Place 0.4 mg under the tongue every 5 (five) minutes as needed.        . TRAVATAN Z 0.004 % SOLN ophthalmic solution Place 1 drop into the left eye at bedtime.        No current facility-administered medications on file prior to visit.    Allergies  Allergen Reactions  . Codeine   . Penicillins     Past Medical History  Diagnosis Date  . Hypertension   . Hyperlipidemia   . Coronary artery disease     s/p CABG & redo CABG with AVR  . Status post aortic valve replacement with tissue     for aortic stenosis  . Carpal tunnel syndrome   . Meningitis   . PAF (paroxysmal atrial fibrillation) Sept 2012    converted spontaneously  . PUD (peptic ulcer disease)     Remote history  . Carotid arterial disease     Doppler in 2003 with 40 to 60% L ICA  . Normal echocardiogram Sept 2012    Has normal EF, mild AS and moderate LAE    Past Surgical History  Procedure  Laterality Date  . Coronary artery bypass graft    . Cardiac catheterization  04/2007    EF 60%/severe two-vessel obstructive coronary artery disease/patent saphenous bein graft to rt coronary/ severe stenosis at the ostium of the lt internal mammary artery graft to the abtuse marginal branch/normal lt ventricular function/ severe aortic stenosis/normal rt heart pressures  . Cardiac catheterization  07/25/2002    EF 65%/two-vessel obstructive atherosclerotic coronary artery disease/ patent saphenous vein graft to the distal rt coronary arter//patent lt internal mammary artery graft to the obtuse marginal branch/normal lt ventricular function  . Cardiac catheterization  05/17/1991    EF65%/two-vessel ovstructive atherosclerotic coronary artery disease/good lt ventricular performance/compared with previous catheterization there is now total occlusion of the tr coronary artery at the prior angioplasty site/good collateral flow to the distal rt coronary artery is seen/there is also progressive disease in the lt circumflex vessel now to obstructive levels  . Cardiac catheterization  07/26/1990    EF60%/borderline obstructive coronary artery disease in the mid lt circumflex & in the rt coronary artery prior angioplasty site/normal lt ventricular function  . Coronary angioplasty  07/12/1990    single vessel obstructive atherosclerotic coronary atrery disease in the rt corornary artery with restenosis of the primary angioplasty site/successful repeat PTCA of the proximal rt coronary arter  . Cardiac catheterization  05/23/1990    successful percutaneous transluminal coronary angioplasty of the proximal rt coronary artery  . Hemorrhoid surgery      at age 80  . Tonsillectomy      at age 80  . Coronary artery bypass graft      x2 using a reverse saphenous vein graft to posterior descending, reseverse saphenous vein graft to lt internal mammary artery at the previous bypass to the circumflex with a no vein  harvesting  . Penile prosthesis implant  1989  . Sternotomy      redo median with aortic valve replacement with a pericardial tissue valve/[employer] model 3000,66mm,serial numver [id number]  . Vasectomy      at age 80  . Inguinal hernia repair  1967    History  Smoking status  . Former Smoker -- 1.50 packs/day for 41 years  . Types: Cigarettes, Cigars  . Quit date: 05/02/1982  Smokeless tobacco  . Never Used    History  Alcohol Use No    Family History  Problem Relation Age of Onset  . Cancer Father     intestinal    Review of Systems: The review of systems is per the HPI.  All other systems were reviewed and are negative.  Physical Exam: BP 140/64  Pulse 70  Ht 5\' 6"  (1.676 m)  Wt 186 lb 11.2 oz (84.687 kg)  BMI 30.15 kg/m2 Patient is an elderly white male who is in no acute distress. Skin is warm and dry. Color is normal.  HEENT is unremarkable. Normocephalic/atraumatic. PERRL. Sclera are nonicteric. Neck is supple. No masses. No JVD. Soft bilateral carotid bruits. Lungs are clear. Cardiac exam shows a regular rate and rhythm. Soft outflow murmur noted. Abdomen is soft. Extremities are with trace edema. Pedal pulses are reduced. Gait and ROM are intact. No gross neurologic deficits noted.  LABORATORY DATA: Lab Results  Component Value Date   WBC 7.4 12/12/2012   HGB 11.9* 12/12/2012   HCT 36.3* 12/12/2012   PLT 179.0 12/12/2012   GLUCOSE 95 12/12/2012   CHOL 156 12/12/2012   TRIG 169.0* 12/12/2012   HDL 38.80* 12/12/2012   LDLCALC 83 12/12/2012   ALT 18 12/12/2012   AST 20 12/12/2012   NA 138 12/12/2012   K 4.4 12/12/2012   CL 110 12/12/2012   CREATININE 1.3 12/12/2012   BUN 26* 12/12/2012   CO2 24 12/12/2012   TSH 3.19 01/12/2011   INR 1.4 06/04/2007   HGBA1C  Value: 6.1 (NOTE)   The ADA recommends the following therapeutic goals for glycemic   control related to Hgb A1C measurement:   Goal of Therapy:   < 7.0% Hgb A1C   Action Suggested:  > 8.0% Hgb A1C   Ref:   Diabetes Care, 22, Suppl. 1, 1999 05/31/2007     Assessment / Plan: 1. Coronary disease status post redo CABG with aortic valve replacement with a pericardial tissue valve in 2008. Myoview study in May of 2013 was normal. He has no significant anginal symptoms. Fairly sedentary. We will continue risk factor modification. Continue aspirin and amlodipine.   2. Hyperlipidemia. Will check fasting lab work today. Given his leg aches will reduce Lipitor to 40 mg daily.  3. Hypertension. Blood pressure is well controlled.  4. Status  post tissue aortic valve replacement. Stable by exam. Last echocardiogram in 2012 was acceptable.  5. Leg pain with good doppler evaluation. Reduce statin dose.  6. Carotid arterial disease. 40-59% bilateral. Will update dopplers.

## 2013-11-12 NOTE — Patient Instructions (Signed)
We will check lab work today  We will schedule you for carotid doppler  I will see you in 6 months.  Reduce atorvastatin (Lipitor) to 40 mg daily.

## 2013-11-13 LAB — TSH: TSH: 2.921 u[IU]/mL (ref 0.350–4.500)

## 2013-11-26 ENCOUNTER — Ambulatory Visit (HOSPITAL_COMMUNITY)
Admission: RE | Admit: 2013-11-26 | Discharge: 2013-11-26 | Disposition: A | Discharge status: Home / Self Care | Payer: Medicare Other | Source: Ambulatory Visit | Attending: Cardiovascular Disease | Admitting: Cardiovascular Disease

## 2013-11-26 DIAGNOSIS — Z953 Presence of xenogenic heart valve: Secondary | ICD-10-CM

## 2013-11-26 DIAGNOSIS — I10 Essential (primary) hypertension: Secondary | ICD-10-CM

## 2013-11-26 DIAGNOSIS — Z952 Presence of prosthetic heart valve: Secondary | ICD-10-CM | POA: Insufficient documentation

## 2013-11-26 DIAGNOSIS — I209 Angina pectoris, unspecified: Secondary | ICD-10-CM | POA: Diagnosis not present

## 2013-11-26 DIAGNOSIS — I25118 Atherosclerotic heart disease of native coronary artery with other forms of angina pectoris: Secondary | ICD-10-CM

## 2013-11-26 DIAGNOSIS — I2510 Atherosclerotic heart disease of native coronary artery without angina pectoris: Secondary | ICD-10-CM | POA: Insufficient documentation

## 2013-11-26 DIAGNOSIS — I4891 Unspecified atrial fibrillation: Secondary | ICD-10-CM | POA: Diagnosis not present

## 2013-11-26 DIAGNOSIS — I779 Disorder of arteries and arterioles, unspecified: Secondary | ICD-10-CM | POA: Diagnosis not present

## 2013-11-26 DIAGNOSIS — I739 Peripheral vascular disease, unspecified: Secondary | ICD-10-CM

## 2013-11-26 DIAGNOSIS — I482 Chronic atrial fibrillation, unspecified: Secondary | ICD-10-CM

## 2013-11-26 NOTE — Progress Notes (Signed)
Carotid Duplex Completed. °Brianna L Mazza,RVT °

## 2014-01-07 ENCOUNTER — Telehealth: Payer: Self-pay | Admitting: Cardiology

## 2014-01-07 NOTE — Telephone Encounter (Signed)
Pt need a new prescription for his nitroglycerin. Please call to [facility]-[phone].

## 2014-01-08 MED ORDER — NITROGLYCERIN 0.4 MG SL SUBL: 0.4000 mg | SUBLINGUAL_TABLET | SUBLINGUAL | Status: DC | PRN

## 2014-01-08 NOTE — Telephone Encounter (Signed)
Calling because he went to get his nitro , and it was not available for him to pick up .Marland Kitchen Was told that it would sent over to his pharmacy on yesterday Please call .Marland Kitchen

## 2014-01-08 NOTE — Telephone Encounter (Signed)
Returned call to patient spoke to wife she stated she was worried about her husband.Stated he had a episode of chest pain last Thursday 01/02/14.Stated took 5 NTG.Stated he refused to go to ER.Stated she would like for me to talk to him.Patient came to phone stated he was outside digging a ditch.Stated he has been having chest heaviness and a drawing pain in left arm and under left arm.Also drawing sensation left side of face.No pain or heaviness at present.Stated his NTG is old and needs a new prescription.NTG refill sent to pharmacy.Extender schedules are full.Appointment scheduled with Dr.Jordan tomorrow 01/09/14 at 4:30 pm.Advised if he has any more chest heaviness or left arm pain he needs to go to ER.

## 2014-01-09 ENCOUNTER — Encounter: Payer: Self-pay | Admitting: Cardiology

## 2014-01-09 ENCOUNTER — Ambulatory Visit (INDEPENDENT_AMBULATORY_CARE_PROVIDER_SITE_OTHER): Payer: Medicare Other | Admitting: Cardiology

## 2014-01-09 VITALS — BP 140/66 | HR 79 | Ht 66.0 in | Wt 189.4 lb

## 2014-01-09 DIAGNOSIS — I209 Angina pectoris, unspecified: Secondary | ICD-10-CM

## 2014-01-09 DIAGNOSIS — I25119 Atherosclerotic heart disease of native coronary artery with unspecified angina pectoris: Secondary | ICD-10-CM

## 2014-01-09 DIAGNOSIS — I2510 Atherosclerotic heart disease of native coronary artery without angina pectoris: Secondary | ICD-10-CM

## 2014-01-09 DIAGNOSIS — I10 Essential (primary) hypertension: Secondary | ICD-10-CM

## 2014-01-09 DIAGNOSIS — Z952 Presence of prosthetic heart valve: Secondary | ICD-10-CM

## 2014-01-09 DIAGNOSIS — E785 Hyperlipidemia, unspecified: Secondary | ICD-10-CM

## 2014-01-09 DIAGNOSIS — Z953 Presence of xenogenic heart valve: Secondary | ICD-10-CM

## 2014-01-09 NOTE — Patient Instructions (Addendum)
You need to pace yourself more and don't overtax yourself  We have refilled your Ntg tablets.  Call me if you have more chest pain.  If you have chest pain that doesn't resolve with 3 Ntg tablets then you need to go to the ER.

## 2014-01-10 NOTE — Progress Notes (Signed)
[name] Date of Birth: [birth year] Medical Record #[id number]  History of Present Illness: [name] is seen back today for a work in visit. He is s/p redo CABG and AVR in 2008. Last week he was very active. He overhauled his tractor and then dug a long ditch by hand. Thursday night he complained of chest pain. He had pain under both arms and chest heaviness. He took 5 sl Ntg and eventually pain resolved. He thinks the Ntg was old and not potent. He also felt some "drawing" of his left face. No real droop. No slurred speech, visual changes or other neurologic changes. Wife wanted him to go to the hospital but he refused. States he feels fine today. He did have carotid dopplers in July 2015 which showed 50-69% left ICA stenosis. This was stable from last year. Myoview study in May 2013 was normal.   Current Outpatient Prescriptions on File Prior to Visit  Medication Sig Dispense Refill  . amLODipine-benazepril (LOTREL) 5-20 MG per capsule Take 1 capsule by mouth 2 (two) times daily.  180 capsule  2  . aspirin (ASPIRIN CHILDRENS) 81 MG chewable tablet Chew 1 tablet (81 mg total) by mouth daily.  36 tablet  11  . atorvastatin (LIPITOR) 80 MG tablet Take 0.5 tablets (40 mg total) by mouth daily.  90 tablet  3  . b complex vitamins tablet Take 1 tablet by mouth daily.      . Coenzyme Q10 (CO Q 10 PO) Take 100 mg by mouth.      . COMBIGAN 0.2-0.5 % ophthalmic solution Place 1 drop into the left eye every 12 (twelve) hours.       . IBUPROFEN PO Take by mouth as needed.      . nitroGLYCERIN (NITROSTAT) 0.4 MG SL tablet Place 1 tablet (0.4 mg total) under the tongue every 5 (five) minutes as needed.  25 tablet  6  . TRAVATAN Z 0.004 % SOLN ophthalmic solution Place 1 drop into the left eye at bedtime.        No current facility-administered medications on file prior to visit.    Allergies  Allergen Reactions  . Codeine     Hallucinations  . Penicillins Hives    Past Medical History   Diagnosis Date  . Hypertension   . Hyperlipidemia   . Coronary artery disease     s/p CABG & redo CABG with AVR  . Status post aortic valve replacement with tissue     for aortic stenosis  . Carpal tunnel syndrome   . Meningitis   . PAF (paroxysmal atrial fibrillation) Sept 2012    converted spontaneously  . PUD (peptic ulcer disease)     Remote history  . Carotid arterial disease     Doppler in 2003 with 40 to 60% L ICA  . Normal echocardiogram Sept 2012    Has normal EF, mild AS and moderate LAE    Past Surgical History  Procedure Laterality Date  . Coronary artery bypass graft    . Cardiac catheterization  04/2007    EF 60%/severe two-vessel obstructive coronary artery disease/patent saphenous bein graft to rt coronary/ severe stenosis at the ostium of the lt internal mammary artery graft to the abtuse marginal branch/normal lt ventricular function/ severe aortic stenosis/normal rt heart pressures  . Cardiac catheterization  07/25/2002    EF 65%/two-vessel obstructive atherosclerotic coronary artery disease/ patent saphenous vein graft to the distal rt coronary arter//patent lt internal mammary artery graft to  the obtuse marginal branch/normal lt ventricular function  . Cardiac catheterization  05/17/1991    EF65%/two-vessel ovstructive atherosclerotic coronary artery disease/good lt ventricular performance/compared with previous catheterization there is now total occlusion of the tr coronary artery at the prior angioplasty site/good collateral flow to the distal rt coronary artery is seen/there is also progressive disease in the lt circumflex vessel now to obstructive levels  . Cardiac catheterization  07/26/1990    EF60%/borderline obstructive coronary artery disease in the mid lt circumflex & in the rt coronary artery prior angioplasty site/normal lt ventricular function  . Coronary angioplasty  07/12/1990    single vessel obstructive atherosclerotic coronary atrery disease in the  rt corornary artery with restenosis of the primary angioplasty site/successful repeat PTCA of the proximal rt coronary arter  . Cardiac catheterization  05/23/1990    successful percutaneous transluminal coronary angioplasty of the proximal rt coronary artery  . Hemorrhoid surgery      at age 54  . Tonsillectomy      at age 61  . Coronary artery bypass graft      x2 using a reverse saphenous vein graft to posterior descending, reseverse saphenous vein graft to lt internal mammary artery at the previous bypass to the circumflex with a no vein harvesting  . Penile prosthesis implant  1989  . Sternotomy      redo median with aortic valve replacement with a pericardial tissue valve/[employer] model 3000,76mm,serial numver [id number]  . Vasectomy      at age 48  . Inguinal hernia repair  1967    History  Smoking status  . Former Smoker -- 1.50 packs/day for 41 years  . Types: Cigarettes, Cigars  . Quit date: 05/02/1982  Smokeless tobacco  . Never Used    History  Alcohol Use No    Family History  Problem Relation Age of Onset  . Cancer Father     intestinal    Review of Systems: The review of systems is per the HPI.  All other systems were reviewed and are negative.  Physical Exam: BP 140/66  Pulse 79  Ht  (1.676 m)  Wt 189 lb 6.4 oz (85.911 kg)  BMI 30.58 kg/m2 Patient is an elderly white male who is in no acute distress. Skin is warm and dry. Color is normal.  HEENT is unremarkable. Normocephalic/atraumatic. PERRL. Sclera are nonicteric. Neck is supple. No masses. No JVD. Soft bilateral carotid bruits L>R. Lungs are clear. Cardiac exam shows a regular rate and rhythm. Soft outflow murmur noted. Abdomen is soft. Extremities are with trace edema. Pedal pulses are reduced. Gait and ROM are intact. No gross neurologic deficits noted.  LABORATORY DATA: Lab Results  Component Value Date   WBC 6.4 11/12/2013   HGB 11.3* 11/12/2013   HCT 32.8* 11/12/2013   PLT 172  11/12/2013   GLUCOSE 96 11/12/2013   CHOL 137 11/12/2013   TRIG 153* 11/12/2013   HDL 39* 11/12/2013   LDLCALC 67 11/12/2013   ALT 17 11/12/2013   AST 19 11/12/2013   NA 140 11/12/2013   K 4.4 11/12/2013   CL 110 11/12/2013   CREATININE 1.33 11/12/2013   BUN 30* 11/12/2013   CO2 23 11/12/2013   TSH 2.921 11/12/2013   INR 1.4 06/04/2007   HGBA1C  Value: 6.1 (NOTE)   The ADA recommends the following therapeutic goals for glycemic   control related to Hgb A1C measurement:   Goal of Therapy:   < 7.0% Hgb A1C  Action Suggested:  > 8.0% Hgb A1C   Ref:  Diabetes Care, 22, Suppl. 1, 1999 05/31/2007   Ecg: NSR rate 79 bpm, normal.  Assessment / Plan: 1. Coronary disease status post redo CABG with aortic valve replacement with a pericardial tissue valve in 2008. Myoview study in May of 2013 was normal. Recent anginal symptoms related to over exertion. Currently pain free and Ecg is normal. Updated Ntg prescription. I cautioned him to curtail his activity and avoid overtaxing himself. He is instructed to call [phone] if chest pain does not respond to Ntg.   2. Hyperlipidemia. On lipitor.  3. Hypertension. Blood pressure is well controlled.  4. Status post tissue aortic valve replacement. Stable by exam. Last echocardiogram in 2012 was acceptable.  5. Leg pain with good doppler evaluation. Reduce statin dose.  6. Carotid arterial disease. 50-69% LICA stenosis. Will monitor.

## 2014-05-12 ENCOUNTER — Encounter: Payer: Self-pay | Admitting: Cardiology

## 2014-05-12 ENCOUNTER — Ambulatory Visit (INDEPENDENT_AMBULATORY_CARE_PROVIDER_SITE_OTHER): Payer: Medicare Other | Admitting: Cardiology

## 2014-05-12 VITALS — BP 132/70 | HR 72 | Ht 66.0 in | Wt 186.6 lb

## 2014-05-12 DIAGNOSIS — I10 Essential (primary) hypertension: Secondary | ICD-10-CM

## 2014-05-12 DIAGNOSIS — I4891 Unspecified atrial fibrillation: Secondary | ICD-10-CM

## 2014-05-12 DIAGNOSIS — E785 Hyperlipidemia, unspecified: Secondary | ICD-10-CM

## 2014-05-12 DIAGNOSIS — I779 Disorder of arteries and arterioles, unspecified: Secondary | ICD-10-CM

## 2014-05-12 DIAGNOSIS — Z953 Presence of xenogenic heart valve: Secondary | ICD-10-CM

## 2014-05-12 DIAGNOSIS — I739 Peripheral vascular disease, unspecified: Secondary | ICD-10-CM

## 2014-05-12 DIAGNOSIS — I25119 Atherosclerotic heart disease of native coronary artery with unspecified angina pectoris: Secondary | ICD-10-CM

## 2014-05-12 DIAGNOSIS — I482 Chronic atrial fibrillation, unspecified: Secondary | ICD-10-CM

## 2014-05-12 LAB — CBC WITH DIFFERENTIAL/PLATELET
BASOS PCT: 0 % (ref 0–1)
Basophils Absolute: 0 10*3/uL (ref 0.0–0.1)
Eosinophils Absolute: 0.1 10*3/uL (ref 0.0–0.7)
Eosinophils Relative: 2 % (ref 0–5)
HCT: 39.2 % (ref 39.0–52.0)
Hemoglobin: 13.1 g/dL (ref 13.0–17.0)
Lymphocytes Relative: 32 % (ref 12–46)
Lymphs Abs: 2.3 10*3/uL (ref 0.7–4.0)
MCH: 27.9 pg (ref 26.0–34.0)
MCHC: 33.4 g/dL (ref 30.0–36.0)
MCV: 83.6 fL (ref 78.0–100.0)
MPV: 9.6 fL (ref 8.6–12.4)
Monocytes Absolute: 0.6 10*3/uL (ref 0.1–1.0)
Monocytes Relative: 8 % (ref 3–12)
Neutro Abs: 4.2 10*3/uL (ref 1.7–7.7)
Neutrophils Relative %: 58 % (ref 43–77)
PLATELETS: 193 10*3/uL (ref 150–400)
RBC: 4.69 MIL/uL (ref 4.22–5.81)
RDW: 15.3 % (ref 11.5–15.5)
WBC: 7.3 10*3/uL (ref 4.0–10.5)

## 2014-05-12 LAB — HEPATIC FUNCTION PANEL
ALT: 23 U/L (ref 0–53)
AST: 18 U/L (ref 0–37)
Albumin: 4 g/dL (ref 3.5–5.2)
Alkaline Phosphatase: 72 U/L (ref 39–117)
BILIRUBIN DIRECT: 0.1 mg/dL (ref 0.0–0.3)
BILIRUBIN TOTAL: 0.6 mg/dL (ref 0.2–1.2)
Indirect Bilirubin: 0.5 mg/dL (ref 0.2–1.2)
Total Protein: 6.6 g/dL (ref 6.0–8.3)

## 2014-05-12 LAB — LIPID PANEL
CHOLESTEROL: 181 mg/dL (ref 0–200)
HDL: 40 mg/dL (ref >39)
LDL Cholesterol: 99 mg/dL (ref 0–99)
TRIGLYCERIDES: 208 mg/dL — AB (ref <150)
Total CHOL/HDL Ratio: 4.5 Ratio
VLDL: 42 mg/dL — ABNORMAL HIGH (ref 0–40)

## 2014-05-12 LAB — BASIC METABOLIC PANEL
BUN: 22 mg/dL (ref 6–23)
CALCIUM: 9.2 mg/dL (ref 8.4–10.5)
CO2: 26 mEq/L (ref 19–32)
CREATININE: 1.38 mg/dL — AB (ref 0.50–1.35)
Chloride: 111 mEq/L (ref 96–112)
Glucose, Bld: 102 mg/dL — ABNORMAL HIGH (ref 70–99)
Potassium: 4.5 mEq/L (ref 3.5–5.3)
Sodium: 141 mEq/L (ref 135–145)

## 2014-05-12 NOTE — Patient Instructions (Signed)
Continue your current therapy  We will check lab work today  I will see you in 6 months   

## 2014-05-12 NOTE — Progress Notes (Signed)
[name] Date of Birth: May 06, 1924 Medical Record #[id number]  History of Present Illness: Corey Peonvery is seen for follow up of CAD. He is s/p redo CABG and AVR in 2008.  He did have carotid dopplers in July 2015 which showed 50-69% left ICA stenosis. This was stable from last year. Myoview study in May 2013 was normal. Today he reports he is doing very well. No chest pain or SOB. No palpitations. He is planning on having cataract surgery. He stays active with woodworking.   Current Outpatient Prescriptions on File Prior to Visit  Medication Sig Dispense Refill  . amLODipine-benazepril (LOTREL) 5-20 MG per capsule Take 1 capsule by mouth 2 (two) times daily. 180 capsule 2  . aspirin (ASPIRIN CHILDRENS) 81 MG chewable tablet Chew 1 tablet (81 mg total) by mouth daily. 36 tablet 11  . atorvastatin (LIPITOR) 80 MG tablet Take 0.5 tablets (40 mg total) by mouth daily. 90 tablet 3  . COMBIGAN 0.2-0.5 % ophthalmic solution Place 1 drop into the left eye every 12 (twelve) hours.     . IBUPROFEN PO Take by mouth as needed.    . nitroGLYCERIN (NITROSTAT) 0.4 MG SL tablet Place 1 tablet (0.4 mg total) under the tongue every 5 (five) minutes as needed. 25 tablet 6  . TRAVATAN Z 0.004 % SOLN ophthalmic solution Place 1 drop into the left eye at bedtime.     Marland Kitchen. b complex vitamins tablet Take 1 tablet by mouth daily.    . Coenzyme Q10 (CO Q 10 PO) Take 100 mg by mouth.     No current facility-administered medications on file prior to visit.    Allergies  Allergen Reactions  . Codeine     Hallucinations  . Penicillins Hives    Past Medical History  Diagnosis Date  . Hypertension   . Hyperlipidemia   . Coronary artery disease     s/p CABG & redo CABG with AVR  . Status post aortic valve replacement with tissue     for aortic stenosis  . Carpal tunnel syndrome   . Meningitis   . PAF (paroxysmal atrial fibrillation) Sept 2012    converted spontaneously  . PUD (peptic ulcer disease)    Remote history  . Carotid arterial disease     Doppler in 2003 with 40 to 60% L ICA  . Normal echocardiogram Sept 2012    Has normal EF, mild AS and moderate LAE    Past Surgical History  Procedure Laterality Date  . Coronary artery bypass graft    . Cardiac catheterization  04/2007    EF 60%/severe two-vessel obstructive coronary artery disease/patent saphenous bein graft to rt coronary/ severe stenosis at the ostium of the lt internal mammary artery graft to the abtuse marginal branch/normal lt ventricular function/ severe aortic stenosis/normal rt heart pressures  . Cardiac catheterization  07/25/2002    EF 65%/two-vessel obstructive atherosclerotic coronary artery disease/ patent saphenous vein graft to the distal rt coronary arter//patent lt internal mammary artery graft to the obtuse marginal branch/normal lt ventricular function  . Cardiac catheterization  05/17/1991    EF65%/two-vessel ovstructive atherosclerotic coronary artery disease/good lt ventricular performance/compared with previous catheterization there is now total occlusion of the tr coronary artery at the prior angioplasty site/good collateral flow to the distal rt coronary artery is seen/there is also progressive disease in the lt circumflex vessel now to obstructive levels  . Cardiac catheterization  07/26/1990    EF60%/borderline obstructive coronary artery disease in the mid  lt circumflex & in the rt coronary artery prior angioplasty site/normal lt ventricular function  . Coronary angioplasty  07/12/1990    single vessel obstructive atherosclerotic coronary atrery disease in the rt corornary artery with restenosis of the primary angioplasty site/successful repeat PTCA of the proximal rt coronary arter  . Cardiac catheterization  05/23/1990    successful percutaneous transluminal coronary angioplasty of the proximal rt coronary artery  . Hemorrhoid surgery      at age 64  . Tonsillectomy      at age 56  . Coronary artery  bypass graft      x2 using a reverse saphenous vein graft to posterior descending, reseverse saphenous vein graft to lt internal mammary artery at the previous bypass to the circumflex with a no vein harvesting  . Penile prosthesis implant  1989  . Sternotomy      redo median with aortic valve replacement with a pericardial tissue valve/[employer] model 3000,33mm,serial numver [id number]  . Vasectomy      at age 69  . Inguinal hernia repair  1967    History  Smoking status  . Former Smoker -- 1.50 packs/day for 41 years  . Types: Cigarettes, Cigars  . Quit date: 05/02/1982  Smokeless tobacco  . Never Used    History  Alcohol Use No    Family History  Problem Relation Age of Onset  . Cancer Father     intestinal    Review of Systems: The review of systems is per the HPI.  All other systems were reviewed and are negative.  Physical Exam: BP 132/70 mmHg  Pulse 72  Ht  (1.676 m)  Wt 186 lb 9 oz (84.624 kg)  BMI 30.13 kg/m2 Patient is an elderly white male who is in no acute distress. Skin is warm and dry. Color is normal.  HEENT is unremarkable. Normocephalic/atraumatic. PERRL. Sclera are nonicteric. Neck is supple. No masses. No JVD. Soft bilateral carotid bruits L>R. Lungs are clear. Cardiac exam shows a regular rate and rhythm. Soft outflow murmur noted. Abdomen is soft. Extremities are with trace edema. Pedal pulses are reduced. Gait and ROM are intact. No gross neurologic deficits noted.  LABORATORY DATA: Lab Results  Component Value Date   WBC 6.4 11/12/2013   HGB 11.3* 11/12/2013   HCT 32.8* 11/12/2013   PLT 172 11/12/2013   GLUCOSE 96 11/12/2013   CHOL 137 11/12/2013   TRIG 153* 11/12/2013   HDL 39* 11/12/2013   LDLCALC 67 11/12/2013   ALT 17 11/12/2013   AST 19 11/12/2013   NA 140 11/12/2013   K 4.4 11/12/2013   CL 110 11/12/2013   CREATININE 1.33 11/12/2013   BUN 30* 11/12/2013   CO2 23 11/12/2013   TSH 2.921 11/12/2013   INR 1.4  06/04/2007   HGBA1C  05/31/2007    6.1 (NOTE)   The ADA recommends the following therapeutic goals for glycemic   control related to Hgb A1C measurement:   Goal of Therapy:   < 7.0% Hgb A1C   Action Suggested:  > 8.0% Hgb A1C   Ref:  Diabetes Care, 22, Suppl. 1, 1999     Assessment / Plan: 1. Coronary disease status post redo CABG with aortic valve replacement with a pericardial tissue valve in 2008. Myoview study in May of 2013 was normal. No significant angina at this time. Continue medical therapy and follow up in 6 months.  2. Hyperlipidemia. On lipitor with good control. Will check fasting lab  today.  3. Hypertension. Blood pressure is well controlled.  4. Status post tissue aortic valve replacement. Stable by exam. Last echocardiogram in 2012 was acceptable.  5.  Carotid arterial disease. 50-69% LICA stenosis. Will repeat in July 2016.

## 2014-05-27 ENCOUNTER — Other Ambulatory Visit: Payer: Self-pay | Admitting: Cardiology

## 2014-05-27 MED ORDER — AMLODIPINE BESY-BENAZEPRIL HCL 5-20 MG PO CAPS: 1.0000 | ORAL_CAPSULE | Freq: Two times a day (BID) | ORAL | Status: DC

## 2014-05-27 MED ORDER — ATORVASTATIN CALCIUM 40 MG PO TABS: 40.0000 mg | ORAL_TABLET | Freq: Every day | ORAL | Status: DC

## 2014-05-27 NOTE — Telephone Encounter (Signed)
Rx refill sent to patient pharmacy   

## 2014-05-27 NOTE — Telephone Encounter (Signed)
Pt called back in stating that his prescriptions for Amlodipine and Atorvastatin have not been called in yet and he is currently out of his medication . [facility] [address]   Thanks

## 2014-05-27 NOTE — Telephone Encounter (Signed)
Pt need a new prescription for his generic Lipitor 40 mg and generic Lotrel 3 months supply and refills. Please call to [facility] [phone].

## 2014-11-11 ENCOUNTER — Ambulatory Visit (INDEPENDENT_AMBULATORY_CARE_PROVIDER_SITE_OTHER): Payer: PPO | Admitting: Cardiology

## 2014-11-11 ENCOUNTER — Encounter: Payer: Self-pay | Admitting: Cardiology

## 2014-11-11 VITALS — BP 140/60 | HR 64 | Ht 66.0 in | Wt 188.9 lb

## 2014-11-11 DIAGNOSIS — I25119 Atherosclerotic heart disease of native coronary artery with unspecified angina pectoris: Secondary | ICD-10-CM | POA: Diagnosis not present

## 2014-11-11 DIAGNOSIS — I4891 Unspecified atrial fibrillation: Secondary | ICD-10-CM

## 2014-11-11 DIAGNOSIS — I482 Chronic atrial fibrillation, unspecified: Secondary | ICD-10-CM

## 2014-11-11 DIAGNOSIS — I10 Essential (primary) hypertension: Secondary | ICD-10-CM

## 2014-11-11 DIAGNOSIS — Z953 Presence of xenogenic heart valve: Secondary | ICD-10-CM

## 2014-11-11 NOTE — Progress Notes (Signed)
[name] Date of Birth: [birth year] Medical Record #[id number]  History of Present Illness: [name] is seen for follow up of CAD. He is s/p redo CABG and AVR in 2008.  He did have carotid dopplers in July 2015 which showed 50-69% left ICA stenosis. This was stable from last year. Myoview study in May 2013 was normal. Today he reports he is doing very well. No chest pain or SOB. No palpitations. He remains very active with his multiple hobbies. He did slip taking out garbage and has a bruise over his right eye.   Current Outpatient Prescriptions on File Prior to Visit  Medication Sig Dispense Refill  . amLODipine-benazepril (LOTREL) 5-20 MG per capsule Take 1 capsule by mouth 2 (two) times daily. 180 capsule 3  . aspirin (ASPIRIN CHILDRENS) 81 MG chewable tablet Chew 1 tablet (81 mg total) by mouth daily. 36 tablet 11  . atorvastatin (LIPITOR) 40 MG tablet Take 1 tablet (40 mg total) by mouth daily. 90 tablet 3  . atorvastatin (LIPITOR) 80 MG tablet Take 0.5 tablets (40 mg total) by mouth daily. 90 tablet 3  . COMBIGAN 0.2-0.5 % ophthalmic solution Place 1 drop into the left eye every 12 (twelve) hours.     . IBUPROFEN PO Take by mouth as needed.    . nitroGLYCERIN (NITROSTAT) 0.4 MG SL tablet Place 1 tablet (0.4 mg total) under the tongue every 5 (five) minutes as needed. 25 tablet 6  . TRAVATAN Z 0.004 % SOLN ophthalmic solution Place 1 drop into the left eye at bedtime.      No current facility-administered medications on file prior to visit.    Allergies  Allergen Reactions  . Codeine     Hallucinations  . Penicillins Hives    Past Medical History  Diagnosis Date  . Hypertension   . Hyperlipidemia   . Coronary artery disease     s/p CABG & redo CABG with AVR  . Status post aortic valve replacement with tissue     for aortic stenosis  . Carpal tunnel syndrome   . Meningitis   . PAF (paroxysmal atrial fibrillation) Sept 2012    converted spontaneously  . PUD (peptic  ulcer disease)     Remote history  . Carotid arterial disease     Doppler in 2003 with 40 to 60% L ICA  . Normal echocardiogram Sept 2012    Has normal EF, mild AS and moderate LAE    Past Surgical History  Procedure Laterality Date  . Coronary artery bypass graft    . Cardiac catheterization  04/2007    EF 60%/severe two-vessel obstructive coronary artery disease/patent saphenous bein graft to rt coronary/ severe stenosis at the ostium of the lt internal mammary artery graft to the abtuse marginal branch/normal lt ventricular function/ severe aortic stenosis/normal rt heart pressures  . Cardiac catheterization  07/25/2002    EF 65%/two-vessel obstructive atherosclerotic coronary artery disease/ patent saphenous vein graft to the distal rt coronary arter//patent lt internal mammary artery graft to the obtuse marginal branch/normal lt ventricular function  . Cardiac catheterization  05/17/1991    EF65%/two-vessel ovstructive atherosclerotic coronary artery disease/good lt ventricular performance/compared with previous catheterization there is now total occlusion of the tr coronary artery at the prior angioplasty site/good collateral flow to the distal rt coronary artery is seen/there is also progressive disease in the lt circumflex vessel now to obstructive levels  . Cardiac catheterization  07/26/1990    EF60%/borderline obstructive coronary artery disease in  the mid lt circumflex & in the rt coronary artery prior angioplasty site/normal lt ventricular function  . Coronary angioplasty  07/12/1990    single vessel obstructive atherosclerotic coronary atrery disease in the rt corornary artery with restenosis of the primary angioplasty site/successful repeat PTCA of the proximal rt coronary arter  . Cardiac catheterization  05/23/1990    successful percutaneous transluminal coronary angioplasty of the proximal rt coronary artery  . Hemorrhoid surgery      at age 34  . Tonsillectomy      at age 20   . Coronary artery bypass graft      x2 using a reverse saphenous vein graft to posterior descending, reseverse saphenous vein graft to lt internal mammary artery at the previous bypass to the circumflex with a no vein harvesting  . Penile prosthesis implant  1989  . Sternotomy      redo median with aortic valve replacement with a pericardial tissue valve/[employer] model 3000,40mm,serial numver [id number]  . Vasectomy      at age 60  . Inguinal hernia repair  1967    History  Smoking status  . Former Smoker -- 1.50 packs/day for 41 years  . Types: Cigarettes, Cigars  . Quit date: 05/02/1982  Smokeless tobacco  . Never Used    History  Alcohol Use No    Family History  Problem Relation Age of Onset  . Cancer Father 80    intestinal  . Heart attack Mother 64  . Stroke Sister     Review of Systems: The review of systems is per the HPI.  All other systems were reviewed and are negative.  Physical Exam: BP 140/60 mmHg  Pulse 64  Ht  (1.676 m)  Wt 85.684 kg (188 lb 14.4 oz)  BMI 30.50 kg/m2 Patient is an elderly white male who is in no acute distress. Skin is warm and dry. Color is normal.  HEENT is unremarkable. Normocephalic/atraumatic. PERRL. Sclera are nonicteric. Neck is supple. No masses. No JVD. Soft bilateral carotid bruits L>R. Lungs are clear. Cardiac exam shows a regular rate and rhythm. Soft outflow murmur noted. Abdomen is soft. Extremities are with 1+ edema. Pedal pulses are reduced. Gait and ROM are intact. No gross neurologic deficits noted.  LABORATORY DATA: Lab Results  Component Value Date   WBC 7.3 05/12/2014   HGB 13.1 05/12/2014   HCT 39.2 05/12/2014   PLT 193 05/12/2014   GLUCOSE 102* 05/12/2014   CHOL 181 05/12/2014   TRIG 208* 05/12/2014   HDL 40 05/12/2014   LDLCALC 99 05/12/2014   ALT 23 05/12/2014   AST 18 05/12/2014   NA 141 05/12/2014   K 4.5 05/12/2014   CL 111 05/12/2014   CREATININE 1.38* 05/12/2014   BUN 22  05/12/2014   CO2 26 05/12/2014   TSH 2.921 11/12/2013   INR 1.4 06/04/2007   HGBA1C  05/31/2007    6.1 (NOTE)   The ADA recommends the following therapeutic goals for glycemic   control related to Hgb A1C measurement:   Goal of Therapy:   < 7.0% Hgb A1C   Action Suggested:  > 8.0% Hgb A1C   Ref:  Diabetes Care, 22, Suppl. 1, 1999     Assessment / Plan: 1. Coronary disease status post redo CABG with aortic valve replacement with a pericardial tissue valve in 2008. Myoview study in May of 2013 was normal. No significant angina at this time. Continue medical therapy and follow up in 6  months.  2. Hyperlipidemia. On lipitor with good control.   3. Hypertension. Blood pressure is well controlled.  4. Status post tissue aortic valve replacement. Stable by exam. Last echocardiogram in 2012 was acceptable.  5.  Carotid arterial disease. 50-69% LICA stenosis.   6. Edema. He has pretty liberal salt intake. Recommend sodium restriction.

## 2014-11-11 NOTE — Patient Instructions (Signed)
Continue your current therapy  You need to restrict your salt intake.  I will see you in 6 months.

## 2014-11-14 ENCOUNTER — Other Ambulatory Visit: Payer: Self-pay | Admitting: Cardiology

## 2014-11-14 DIAGNOSIS — I739 Peripheral vascular disease, unspecified: Principal | ICD-10-CM

## 2014-11-14 DIAGNOSIS — I779 Disorder of arteries and arterioles, unspecified: Secondary | ICD-10-CM

## 2014-12-08 ENCOUNTER — Ambulatory Visit (HOSPITAL_COMMUNITY)
Admission: RE | Admit: 2014-12-08 | Discharge: 2014-12-08 | Disposition: A | Discharge status: Home / Self Care | Payer: PPO | Source: Ambulatory Visit | Attending: Cardiovascular Disease | Admitting: Cardiovascular Disease

## 2014-12-08 DIAGNOSIS — I739 Peripheral vascular disease, unspecified: Secondary | ICD-10-CM

## 2014-12-08 DIAGNOSIS — I6523 Occlusion and stenosis of bilateral carotid arteries: Secondary | ICD-10-CM | POA: Diagnosis not present

## 2014-12-08 DIAGNOSIS — I779 Disorder of arteries and arterioles, unspecified: Secondary | ICD-10-CM

## 2014-12-08 LAB — HEPATIC FUNCTION PANEL
ALK PHOS: 61 U/L (ref 40–115)
ALT: 24 U/L (ref 9–46)
AST: 21 U/L (ref 10–35)
Albumin: 3.9 g/dL (ref 3.6–5.1)
Bilirubin, Direct: 0.1 mg/dL (ref <=0.2)
Indirect Bilirubin: 0.4 mg/dL (ref 0.2–1.2)
TOTAL PROTEIN: 6.7 g/dL (ref 6.1–8.1)
Total Bilirubin: 0.5 mg/dL (ref 0.2–1.2)

## 2014-12-08 LAB — LIPID PANEL
CHOL/HDL RATIO: 4.6 ratio (ref <=5.0)
CHOLESTEROL: 176 mg/dL (ref 125–200)
HDL: 38 mg/dL — ABNORMAL LOW (ref >=40)
LDL CALC: 86 mg/dL (ref <130)
Triglycerides: 262 mg/dL — ABNORMAL HIGH (ref <150)
VLDL: 52 mg/dL — AB (ref <30)

## 2014-12-08 LAB — BASIC METABOLIC PANEL
BUN: 25 mg/dL (ref 7–25)
CO2: 26 mmol/L (ref 20–31)
Calcium: 9.2 mg/dL (ref 8.6–10.3)
Chloride: 104 mmol/L (ref 98–110)
Creat: 1.49 mg/dL — ABNORMAL HIGH (ref 0.70–1.11)
GLUCOSE: 88 mg/dL (ref 65–99)
POTASSIUM: 5 mmol/L (ref 3.5–5.3)
Sodium: 141 mmol/L (ref 135–146)

## 2015-01-22 ENCOUNTER — Ambulatory Visit (INDEPENDENT_AMBULATORY_CARE_PROVIDER_SITE_OTHER): Payer: PPO | Admitting: Physician Assistant

## 2015-01-22 ENCOUNTER — Telehealth: Payer: Self-pay | Admitting: Cardiology

## 2015-01-22 VITALS — BP 130/60 | HR 77 | Temp 97.8°F | Resp 16 | Ht 66.0 in | Wt 192.0 lb

## 2015-01-22 DIAGNOSIS — Z23 Encounter for immunization: Secondary | ICD-10-CM | POA: Diagnosis not present

## 2015-01-22 DIAGNOSIS — I739 Peripheral vascular disease, unspecified: Secondary | ICD-10-CM

## 2015-01-22 DIAGNOSIS — R252 Cramp and spasm: Secondary | ICD-10-CM

## 2015-01-22 LAB — LIPID PANEL
CHOLESTEROL: 181 mg/dL (ref 125–200)
HDL: 44 mg/dL (ref >=40)
LDL Cholesterol: 86 mg/dL (ref <130)
TRIGLYCERIDES: 254 mg/dL — AB (ref <150)
Total CHOL/HDL Ratio: 4.1 Ratio (ref <=5.0)
VLDL: 51 mg/dL — AB (ref <30)

## 2015-01-22 MED ORDER — CILOSTAZOL 100 MG PO TABS: 100.0000 mg | ORAL_TABLET | Freq: Two times a day (BID) | ORAL | Status: DC

## 2015-01-22 NOTE — Progress Notes (Signed)
   Subjective:    Patient ID: [name], male    DOB: May 06, 1924, 81 y.o.   MRN: [id number]  HPI Patient presents for bilateral leg cramping that has been present for the past year, but has been getting worse over the past month or so. Walking makes leg cramps worse. Has had to take a break from just walking from car into grocery stores. Stopped smoking cigarettes back in 1984. H/o CABG, HTN, A. Fib, dyslipidemia. Taking all medications as prescribed and last followed up with cardiologist Dr. Peter Swaziland in 10/2014. Leg cramping felt mostly in calves and not associated with CP or SOB. Last took nitroglycerin 2 weeks ago for chest discomfort that resolved following pill.   Allergies  Allergen Reactions  . Codeine     Hallucinations  . Penicillins Hives   Review of Systems  Constitutional: Negative for fever, chills, diaphoresis and fatigue.  Respiratory: Negative for cough and shortness of breath.   Cardiovascular: Negative for chest pain, palpitations and leg swelling.  Musculoskeletal: Positive for gait problem. Negative for myalgias, joint swelling and arthralgias.  Skin: Negative.   Neurological: Negative for dizziness, weakness, numbness and headaches.       Objective:   Physical Exam  Constitutional: He is oriented to person, place, and time. He appears well-developed and well-nourished. No distress.  Blood pressure 130/60, pulse 77, temperature 97.8 F (36.6 C), temperature source Oral, resp. rate 16, height  (1.676 m), weight 192 lb (87.091 kg), SpO2 97 %.  HENT:  Head: Normocephalic and atraumatic.  Right Ear: External ear normal.  Left Ear: External ear normal.  Eyes: Conjunctivae are normal. Pupils are equal, round, and reactive to light. Right eye exhibits no discharge. Left eye exhibits no discharge. No scleral icterus.  Neck: Neck supple. No JVD present. Carotid bruit is not present.  Cardiovascular: Normal rate, regular rhythm, normal heart sounds and intact  distal pulses.  Exam reveals no gallop and no friction rub.   No murmur heard. Pulmonary/Chest: Effort normal. No respiratory distress. He has no wheezes.  Musculoskeletal: Normal range of motion. He exhibits no edema or tenderness.       Right lower leg: Normal.       Left lower leg: Normal.  Lymphadenopathy:    He has no cervical adenopathy.  Neurological: He is alert and oriented to person, place, and time. He has normal reflexes. No cranial nerve deficit. He exhibits normal muscle tone. Coordination normal.  Skin: Skin is warm and dry. No rash noted. He is not diaphoretic. No erythema. No pallor.  Psychiatric: He has a normal mood and affect. His behavior is normal. Judgment and thought content normal.    EKG by Neva Seat: Nonspecific T-waves in lead III.     Assessment & Plan:  1. Bilateral leg cramps 3. Claudication Spoke with nurse of Dr. Herbie Baltimore who is covering for Dr. Swaziland and agrees with starting Pletal. Advised patient to f/u with Dr. Swaziland soon and he agrees.  - Lipid panel - EKG 12-Lead - ABI; Future - cilostazol (PLETAL) 100 MG tablet; Take 1 tablet (100 mg total) by mouth 2 (two) times daily.  Dispense: 60 tablet; Refill: 1  2. Need for prophylactic vaccination and inoculation against influenza - Flu Vaccine QUAD 36+ mos IM      [name] Brewington PA-C  [facility] 01/22/2015 6:00 PM

## 2015-01-22 NOTE — Patient Instructions (Signed)

## 2015-01-22 NOTE — Telephone Encounter (Signed)
[name] called back in stating that she has already ordered an ABI and started him on Pletal . Call back if you need to   Thanks

## 2015-01-22 NOTE — Telephone Encounter (Signed)
Received a call from [employer] PA with [facility].Stated she saw pt this afternoon with claudication symptoms.She wanted to ask Dr.Jordan if ok to start Pletal 100 mg twice a day.Dr.Jordan out of office this week.Spoke to DOD Dr.Harding he advised needs lower ext arterial dopplers and follow up with Dr.Jordan.

## 2015-01-28 ENCOUNTER — Ambulatory Visit (HOSPITAL_COMMUNITY)
Admission: RE | Admit: 2015-01-28 | Discharge: 2015-01-28 | Disposition: A | Discharge status: Home / Self Care | Payer: PPO | Source: Ambulatory Visit | Attending: Physician Assistant | Admitting: Physician Assistant

## 2015-01-28 DIAGNOSIS — I739 Peripheral vascular disease, unspecified: Secondary | ICD-10-CM | POA: Diagnosis not present

## 2015-01-28 DIAGNOSIS — R252 Cramp and spasm: Secondary | ICD-10-CM | POA: Insufficient documentation

## 2015-01-28 DIAGNOSIS — R938 Abnormal findings on diagnostic imaging of other specified body structures: Secondary | ICD-10-CM | POA: Diagnosis not present

## 2015-01-28 NOTE — Progress Notes (Signed)
VASCULAR LAB PRELIMINARY  ARTERIAL  ABI completed: Mild arterial insufficiency in both lower extremities at rest.    RIGHT    LEFT    PRESSURE WAVEFORM  PRESSURE WAVEFORM  BRACHIAL 141 Triphasic BRACHIAL 143 Triphasic  DP 128 biphasic DP 128 biphasic  PT 136 biphasic PT 136 biphasic  GREAT TOE  NA GREAT TOE  NA    RIGHT LEFT  ABI 0.95 0.95     Sturdivant, Rita D, RVT 01/28/2015, 11:15 AM

## 2015-01-30 NOTE — Telephone Encounter (Signed)
Spoke to patient he stated Pletal seems to be helping leg pain.Dr.Jordan advised ok to continue.Advised to call back in 2 weeks to schedule January appointment with Dr.Jordan.

## 2015-02-03 ENCOUNTER — Telehealth: Payer: Self-pay | Admitting: Physician Assistant

## 2015-02-03 NOTE — Telephone Encounter (Signed)
Opened in error

## 2015-03-31 ENCOUNTER — Other Ambulatory Visit: Payer: Self-pay | Admitting: *Deleted

## 2015-03-31 MED ORDER — NITROGLYCERIN 0.4 MG SL SUBL: 0.4000 mg | SUBLINGUAL_TABLET | SUBLINGUAL | Status: DC | PRN

## 2015-04-01 ENCOUNTER — Other Ambulatory Visit: Payer: Self-pay | Admitting: Cardiology

## 2015-04-01 NOTE — Telephone Encounter (Signed)
°*  STAT* If patient is at the pharmacy, call can be transferred to refill team.   1. Which medications need to be refilled? (please list name of each medication and dose if known) Nitroglycerin  2. Which pharmacy/location (including street and city if local pharmacy) is medication to be sent to?[phone] 3. Do they need a 30 day or 90 day supply? 25

## 2015-04-02 NOTE — Telephone Encounter (Signed)
Medication already refilled. See encounter from 03/31/15.

## 2015-05-26 ENCOUNTER — Encounter: Payer: Self-pay | Admitting: Cardiology

## 2015-05-26 ENCOUNTER — Other Ambulatory Visit: Payer: Self-pay

## 2015-05-26 ENCOUNTER — Ambulatory Visit (INDEPENDENT_AMBULATORY_CARE_PROVIDER_SITE_OTHER): Payer: PPO | Admitting: Cardiology

## 2015-05-26 VITALS — BP 142/60 | HR 64 | Ht 66.0 in | Wt 186.4 lb

## 2015-05-26 DIAGNOSIS — I482 Chronic atrial fibrillation, unspecified: Secondary | ICD-10-CM

## 2015-05-26 DIAGNOSIS — I10 Essential (primary) hypertension: Secondary | ICD-10-CM

## 2015-05-26 DIAGNOSIS — I25119 Atherosclerotic heart disease of native coronary artery with unspecified angina pectoris: Secondary | ICD-10-CM

## 2015-05-26 DIAGNOSIS — I739 Peripheral vascular disease, unspecified: Secondary | ICD-10-CM | POA: Diagnosis not present

## 2015-05-26 DIAGNOSIS — Z953 Presence of xenogenic heart valve: Secondary | ICD-10-CM

## 2015-05-26 NOTE — Patient Instructions (Addendum)
Continue your current therapy  I will see you in 6 months with fasting lab work.   

## 2015-05-26 NOTE — Progress Notes (Signed)
[name] Date of Birth: 03/20/1925 Medical Record #[id number]  History of Present Illness: [name] is seen for follow up of CAD. He is s/p redo CABG and AVR in 2008.  He did have carotid dopplers in July 2015 which showed 50-69% left ICA stenosis. This was stable from last year. Myoview study in May 2013 was normal. Echo in 2012 showed normal LV function and mild aortic stenosis.   Today he reports he is doing very well. No chest pain or SOB. No palpitations. He is not as active as in the past. 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 Slowing down. He turns 89 this week. He does complain of claudication in his calves when walking up hill. LE dopplers in September showed ABI 0.95 bilateral.  Current Outpatient Prescriptions on File Prior to Visit  Medication Sig Dispense Refill  . amLODipine-benazepril (LOTREL) 5-20 MG per capsule Take 1 capsule by mouth 2 (two) times daily. 180 capsule 3  . aspirin (ASPIRIN CHILDRENS) 81 MG chewable tablet Chew 1 tablet (81 mg total) by mouth daily. 36 tablet 11  . atorvastatin (LIPITOR) 40 MG tablet Take 1 tablet (40 mg total) by mouth daily. 90 tablet 3  . cilostazol (PLETAL) 100 MG tablet Take 1 tablet (100 mg total) by mouth 2 (two) times daily. 60 tablet 1  . COMBIGAN 0.2-0.5 % ophthalmic solution Place 1 drop into the left eye every 12 (twelve) hours.     . IBUPROFEN PO Take by mouth as needed.    . loratadine (CLARITIN REDITABS) 10 MG dissolvable tablet Take 10 mg by mouth daily.    . nitroGLYCERIN (NITROSTAT) 0.4 MG SL tablet Place 1 tablet (0.4 mg total) under the tongue every 5 (five) minutes as needed. NEED OV. 25 tablet 1  . TRAVATAN Z 0.004 % SOLN ophthalmic solution Place 1 drop into the left eye at bedtime.      No current facility-administered medications on file prior to visit.    Allergies  Allergen Reactions  . Codeine     Hallucinations  . Penicillins Hives    Past Medical History  Diagnosis Date  . Hypertension    . Hyperlipidemia   . Coronary artery disease     s/p CABG & redo CABG with AVR  . Status post aortic valve replacement with tissue     for aortic stenosis  . Carpal tunnel syndrome   . Meningitis   . PAF (paroxysmal atrial fibrillation) [facility]) Sept 2012    converted spontaneously  . PUD (peptic ulcer disease)     Remote history  . Carotid arterial disease (HCC)     Doppler in 2003 with 40 to 60% L ICA  . Normal echocardiogram Sept 2012    Has normal EF, mild AS and moderate LAE    Past Surgical History  Procedure Laterality Date  . Coronary artery bypass graft    . Cardiac catheterization  04/2007    EF 60%/severe two-vessel obstructive coronary artery disease/patent saphenous bein graft to rt coronary/ severe stenosis at the ostium of the lt internal mammary artery graft to the abtuse marginal branch/normal lt ventricular function/ severe aortic stenosis/normal rt heart pressures  . Cardiac catheterization  07/25/2002    EF 65%/two-vessel obstructive atherosclerotic coronary artery disease/ patent saphenous vein graft to the distal rt coronary arter//patent lt internal mammary artery graft to the obtuse marginal branch/normal lt ventricular function  . Cardiac catheterization  05/17/1991    EF65%/two-vessel ovstructive atherosclerotic coronary  artery disease/good lt ventricular performance/compared with previous catheterization there is now total occlusion of the tr coronary artery at the prior angioplasty site/good collateral flow to the distal rt coronary artery is seen/there is also progressive disease in the lt circumflex vessel now to obstructive levels  . Cardiac catheterization  07/26/1990    EF60%/borderline obstructive coronary artery disease in the mid lt circumflex & in the rt coronary artery prior angioplasty site/normal lt ventricular function  . Coronary angioplasty  07/12/1990    single vessel obstructive atherosclerotic coronary atrery disease in the rt corornary artery  with restenosis of the primary angioplasty site/successful repeat PTCA of the proximal rt coronary arter  . Cardiac catheterization  05/23/1990    successful percutaneous transluminal coronary angioplasty of the proximal rt coronary artery  . Hemorrhoid surgery      at age 68  . Tonsillectomy      at age 18  . Coronary artery bypass graft      x2 using a reverse saphenous vein graft to posterior descending, reseverse saphenous vein graft to lt internal mammary artery at the previous bypass to the circumflex with a no vein harvesting  . Penile prosthesis implant  1989  . Sternotomy      redo median with aortic valve replacement with a pericardial tissue valve/[employer] model 3000,93mm,serial numver [id number]  . Vasectomy      at age 46  . Inguinal hernia repair  1967    History  Smoking status  . Former Smoker -- 1.50 packs/day for 41 years  . Types: Cigarettes, Cigars  . Quit date: 05/02/1982  Smokeless tobacco  . Never Used    History  Alcohol Use No    Family History  Problem Relation Age of Onset  . Cancer Father 27    intestinal  . Heart attack Mother 2  . Stroke Sister     Review of Systems: The review of systems is per the HPI.  All other systems were reviewed and are negative.  Physical Exam: BP 142/60 mmHg  Pulse 64  Ht  (1.676 m)  Wt 84.539 kg (186 lb 6 oz)  BMI 30.10 kg/m2 Patient is an elderly white male who is in no acute distress. Skin is warm and dry. Color is normal.  HEENT is unremarkable. Normocephalic/atraumatic. PERRL. Sclera are nonicteric. Neck is supple. No masses. No JVD. Soft bilateral carotid bruits L>R. Lungs are clear. Cardiac exam shows a regular rate and rhythm. Soft outflow murmur noted. Abdomen is soft. Extremities are with tr edema. Pedal pulses are reduced. Gait and ROM are intact. No gross neurologic deficits noted.  LABORATORY DATA: Lab Results  Component Value Date   WBC 7.3 05/12/2014   HGB 13.1 05/12/2014   HCT  39.2 05/12/2014   PLT 193 05/12/2014   GLUCOSE 88 12/08/2014   CHOL 181 01/22/2015   TRIG 254* 01/22/2015   HDL 44 01/22/2015   LDLCALC 86 01/22/2015   ALT 24 12/08/2014   AST 21 12/08/2014   NA 141 12/08/2014   K 5.0 12/08/2014   CL 104 12/08/2014   CREATININE 1.49* 12/08/2014   BUN 25 12/08/2014   CO2 26 12/08/2014   TSH 2.921 11/12/2013   INR 1.4 06/04/2007   HGBA1C  05/31/2007    6.1 (NOTE)   The ADA recommends the following therapeutic goals for glycemic   control related to Hgb A1C measurement:   Goal of Therapy:   < 7.0% Hgb A1C   Action Suggested:  >  8.0% Hgb A1C   Ref:  Diabetes Care, 22, Suppl. 1, 1999     Assessment / Plan: 1. Coronary disease status post redo CABG with aortic valve replacement with a pericardial tissue valve in 2008. Myoview study in May of 2013 was normal. No significant angina at this time. Continue medical therapy and follow up in 6 months.  2. Hyperlipidemia. On lipitor with good control. Repeat fasting labs next visit.   3. Hypertension. Blood pressure is well controlled.  4. Status post tissue aortic valve replacement. Stable by exam. Last echocardiogram in 2012 was acceptable.  5.  Carotid arterial disease. 50-69% LICA stenosis.   6. Edema. Improved with sodium restriction. Weight is down 6 lbs.   7. Claudication. ABI 0.95 bilateral. Recommend walking.

## 2015-06-22 ENCOUNTER — Other Ambulatory Visit: Payer: Self-pay

## 2015-06-26 ENCOUNTER — Telehealth: Payer: Self-pay

## 2015-06-26 MED ORDER — AMLODIPINE BESY-BENAZEPRIL HCL 5-20 MG PO CAPS: 1.0000 | ORAL_CAPSULE | Freq: Two times a day (BID) | ORAL | Status: DC

## 2015-06-26 MED ORDER — ATORVASTATIN CALCIUM 40 MG PO TABS: 40.0000 mg | ORAL_TABLET | Freq: Every day | ORAL | Status: DC

## 2015-06-26 NOTE — Telephone Encounter (Signed)
Patient walked in office stating he has been trying all week to call our office and all he has been getting was a message saying mail box is full,unable to speak to someone.Stated he needs refills for lotrel and atorvastatin.90 day refills sent to pharmacy.Phone problem reported to [employer] in business services.

## 2015-08-01 ENCOUNTER — Ambulatory Visit (INDEPENDENT_AMBULATORY_CARE_PROVIDER_SITE_OTHER): Payer: PPO | Admitting: Family Medicine

## 2015-08-01 VITALS — BP 144/58 | HR 71 | Temp 97.2°F | Resp 16 | Ht 66.0 in | Wt 187.5 lb

## 2015-08-01 DIAGNOSIS — R062 Wheezing: Secondary | ICD-10-CM

## 2015-08-01 DIAGNOSIS — J069 Acute upper respiratory infection, unspecified: Secondary | ICD-10-CM | POA: Diagnosis not present

## 2015-08-01 MED ORDER — ALBUTEROL SULFATE HFA 108 (90 BASE) MCG/ACT IN AERS: 2.0000 | INHALATION_SPRAY | RESPIRATORY_TRACT | Status: DC | PRN

## 2015-08-01 NOTE — Patient Instructions (Addendum)
Albuterol 2 inhalations every 4-6 hours when awake. Try to hold it in about 10 seconds as directed. Spread the inhalations apart by 1 or 2 minutes and it will work better.  If you're getting worse please come back for a recheck  Make sure you are drinking plenty of water to help keep the mucus thin.    IF you received an x-ray today, you will receive an invoice [facility]. Please contact [facility] at [phone] with questions or concerns regarding your invoice.   IF you received labwork today, you will receive an invoice from [facility]. Please contact [facility] at [phone] with questions or concerns regarding your invoice.   Our billing staff will not be able to assist you with questions regarding bills from these companies.  You will be contacted with the lab results as soon as they are available. The fastest way to get your results is to activate your My Chart account. Instructions are located on the last page of this paperwork. If you have not heard from us regarding the results in 2 weeks, please contact this office.

## 2015-08-01 NOTE — Progress Notes (Signed)
Patient ID: [name], male    DOB: [birth year]  Age: 82 y.o. MRN: [id number]  Chief Complaint  Patient presents with  . Cough    x 1 week    Subjective:   82 year old gentleman who has been fighting an upper respiratory infection for the last week or so. He has a cough that continues to persist. He continues to blow a lot of mucus from his nose. The mucus production all seems to be fairly clear. He is not febrile. He has felt a little run down. He quit smoking many years ago. He remains alert and active.  Current allergies, medications, problem list, past/family and social histories reviewed.  Objective:  BP 144/58 mmHg  Pulse 71  Temp(Src) 97.2 F (36.2 C) (Oral)  Resp 16  Ht 5\' 6"  (1.676 m)  Wt 187 lb 8 oz (85.049 kg)  BMI 30.28 kg/m2  SpO2 96%  No major acute distress. His TMs are normal. Wears bilateral hearing aids. His throat is clear. Neck supple without significant nodes. Nose is congested. Chest has soft wheezing bilaterally. No rales or rhonchi were noted. Heart regular without murmur.  Assessment & Plan:   Assessment: 1. Wheezing   2. Acute upper respiratory infection       Plan: We'll treat symptomatically with albuterol. Return if worse.  No orders of the defined types were placed in this encounter.    Meds ordered this encounter  Medications  . albuterol (PROVENTIL HFA;VENTOLIN HFA) 108 (90 Base) MCG/ACT inhaler    Sig: Inhale 2 puffs into the lungs every 4 (four) hours as needed for wheezing or shortness of breath (cough, shortness of breath or wheezing.).    Dispense:  1 Inhaler    Refill:  1         Patient Instructions   Albuterol 2 inhalations every 4-6 hours when awake. Try to hold it in about 10 seconds as directed. Spread the inhalations apart by 1 or 2 minutes and it will work better.  If you're getting worse please come back for a recheck  Make sure you are drinking plenty of water to help keep the mucus thin.    IF you  received an x-ray today, you will receive an invoice from [facility]. Please contact [facility] at [phone] with questions or concerns regarding your invoice.   IF you received labwork today, you will receive an invoice from [facility]. Please contact [facility] at [phone] with questions or concerns regarding your invoice.   Our billing staff will not be able to assist you with questions regarding bills from these companies.  You will be contacted with the lab results as soon as they are available. The fastest way to get your results is to activate your My Chart account. Instructions are located on the last page of this paperwork. If you have not heard from us regarding the results in 2 weeks, please contact this office.         Return if symptoms worsen or fail to improve.   Lamaria Hildebrandt, MD 08/01/2015

## 2015-09-14 DIAGNOSIS — Z961 Presence of intraocular lens: Secondary | ICD-10-CM | POA: Diagnosis not present

## 2015-09-14 DIAGNOSIS — H524 Presbyopia: Secondary | ICD-10-CM | POA: Diagnosis not present

## 2015-09-14 DIAGNOSIS — H401111 Primary open-angle glaucoma, right eye, mild stage: Secondary | ICD-10-CM | POA: Diagnosis not present

## 2015-09-14 DIAGNOSIS — H401422 Capsular glaucoma with pseudoexfoliation of lens, left eye, moderate stage: Secondary | ICD-10-CM | POA: Diagnosis not present

## 2015-11-23 DIAGNOSIS — I10 Essential (primary) hypertension: Secondary | ICD-10-CM | POA: Diagnosis not present

## 2015-11-23 DIAGNOSIS — I25119 Atherosclerotic heart disease of native coronary artery with unspecified angina pectoris: Secondary | ICD-10-CM | POA: Diagnosis not present

## 2015-11-23 LAB — HEPATIC FUNCTION PANEL
ALT: 13 U/L (ref 9–46)
AST: 15 U/L (ref 10–35)
Albumin: 3.9 g/dL (ref 3.6–5.1)
Alkaline Phosphatase: 56 U/L (ref 40–115)
BILIRUBIN DIRECT: 0 mg/dL (ref <=0.2)
BILIRUBIN INDIRECT: 0.5 mg/dL (ref 0.2–1.2)
BILIRUBIN TOTAL: 0.5 mg/dL (ref 0.2–1.2)
Total Protein: 6.1 g/dL (ref 6.1–8.1)

## 2015-11-23 LAB — LIPID PANEL
Cholesterol: 156 mg/dL (ref 125–200)
HDL: 43 mg/dL (ref >=40)
LDL Cholesterol: 80 mg/dL (ref <130)
Total CHOL/HDL Ratio: 3.6 Ratio (ref <=5.0)
Triglycerides: 167 mg/dL — ABNORMAL HIGH (ref <150)
VLDL: 33 mg/dL — ABNORMAL HIGH (ref <30)

## 2015-11-23 LAB — BASIC METABOLIC PANEL
BUN: 24 mg/dL (ref 7–25)
CO2: 26 mmol/L (ref 20–31)
Calcium: 8.9 mg/dL (ref 8.6–10.3)
Chloride: 110 mmol/L (ref 98–110)
Creat: 1.32 mg/dL — ABNORMAL HIGH (ref 0.70–1.11)
Glucose, Bld: 112 mg/dL — ABNORMAL HIGH (ref 65–99)
Potassium: 5.2 mmol/L (ref 3.5–5.3)
Sodium: 143 mmol/L (ref 135–146)

## 2015-11-29 NOTE — Progress Notes (Addendum)
[name] Date of Birth: [birth year] Medical Record #[id number]  History of Present Illness: [name] is seen for follow up of CAD. He is s/p redo CABG and AVR in 2008.  He did have carotid dopplers in August 2016  which showed 40-59% left ICA stenosis. This was unchanged from prior exams.  Myoview study in May 2013 was normal. Echo in 2012 showed normal LV function and mild aortic stenosis. He also had LE arterial dopplers in August 2016 which showed mild vascular disease with good ABIs.   Today he reports he is doing very well. No chest pain or SOB. No palpitations. He did have a small garden this year.   He does complain of pain in his calves when walking up hill. He notes sinus congestion at night. He notes tick exposures but no fever, chills, or rashes. He is getting ready for his 72th wedding anniversary.  Current Outpatient Prescriptions on File Prior to Visit  Medication Sig Dispense Refill  . amLODipine-benazepril (LOTREL) 5-20 MG capsule Take 1 capsule by mouth 2 (two) times daily. 180 capsule 3  . aspirin (ASPIRIN CHILDRENS) 81 MG chewable tablet Chew 1 tablet (81 mg total) by mouth daily. 36 tablet 11  . atorvastatin (LIPITOR) 40 MG tablet Take 1 tablet (40 mg total) by mouth daily. 90 tablet 3  . COMBIGAN 0.2-0.5 % ophthalmic solution Place 1 drop into the left eye every 12 (twelve) hours.     . IBUPROFEN PO Take by mouth as needed.    . loratadine (CLARITIN REDITABS) 10 MG dissolvable tablet Take 10 mg by mouth daily.    . nitroGLYCERIN (NITROSTAT) 0.4 MG SL tablet Place 1 tablet (0.4 mg total) under the tongue every 5 (five) minutes as needed. NEED OV. 25 tablet 1  . TRAVATAN Z 0.004 % SOLN ophthalmic solution Place 1 drop into the left eye at bedtime.      No current facility-administered medications on file prior to visit.     Allergies  Allergen Reactions  . Codeine     Hallucinations  . Penicillins Hives    Past Medical History:  Diagnosis Date  . Carotid  arterial disease (HCC)    Doppler in 2003 with 40 to 60% L ICA  . Carpal tunnel syndrome   . Coronary artery disease    s/p CABG & redo CABG with AVR  . Hyperlipidemia   . Hypertension   . Meningitis   . Normal echocardiogram Sept 2012   Has normal EF, mild AS and moderate LAE  . PAF (paroxysmal atrial fibrillation) [facility]) Sept 2012   converted spontaneously  . PUD (peptic ulcer disease)    Remote history  . Status post aortic valve replacement with tissue    for aortic stenosis    Past Surgical History:  Procedure Laterality Date  . CARDIAC CATHETERIZATION  04/2007   EF 60%/severe two-vessel obstructive coronary artery disease/patent saphenous bein graft to rt coronary/ severe stenosis at the ostium of the lt internal mammary artery graft to the abtuse marginal branch/normal lt ventricular function/ severe aortic stenosis/normal rt heart pressures  . CARDIAC CATHETERIZATION  07/25/2002   EF 65%/two-vessel obstructive atherosclerotic coronary artery disease/ patent saphenous vein graft to the distal rt coronary arter//patent lt internal mammary artery graft to the obtuse marginal branch/normal lt ventricular function  . CARDIAC CATHETERIZATION  05/17/1991   EF65%/two-vessel ovstructive atherosclerotic coronary artery disease/good lt ventricular performance/compared with previous catheterization there is now total occlusion of the tr coronary artery at the  prior angioplasty site/good collateral flow to the distal rt coronary artery is seen/there is also progressive disease in the lt circumflex vessel now to obstructive levels  . CARDIAC CATHETERIZATION  07/26/1990   EF60%/borderline obstructive coronary artery disease in the mid lt circumflex & in the rt coronary artery prior angioplasty site/normal lt ventricular function  . CARDIAC CATHETERIZATION  05/23/1990   successful percutaneous transluminal coronary angioplasty of the proximal rt coronary artery  . CORONARY ANGIOPLASTY  07/12/1990    single vessel obstructive atherosclerotic coronary atrery disease in the rt corornary artery with restenosis of the primary angioplasty site/successful repeat PTCA of the proximal rt coronary arter  . CORONARY ARTERY BYPASS GRAFT    . CORONARY ARTERY BYPASS GRAFT     x2 using a reverse saphenous vein graft to posterior descending, reseverse saphenous vein graft to lt internal mammary artery at the previous bypass to the circumflex with a no vein harvesting  . HEMORRHOID SURGERY     at age 36  . INGUINAL HERNIA REPAIR  1967  . PENILE PROSTHESIS IMPLANT  1989  . STERNOTOMY     redo median with aortic valve replacement with a pericardial tissue valve/[employer] model 3000,15mm,serial numver [id number]  . TONSILLECTOMY     at age 56  . VASECTOMY     at age 53    History  Smoking Status  . Former Smoker  . Packs/day: 1.50  . Years: 41.00  . Types: Cigarettes, Cigars  . Quit date: 05/02/1982  Smokeless Tobacco  . Never Used    History  Alcohol Use No    Family History  Problem Relation Age of Onset  . Cancer Father 22    intestinal  . Heart attack Mother 48  . Stroke Sister     Review of Systems: The review of systems is per the HPI.  All other systems were reviewed and are negative.  Physical Exam: BP 136/64   Pulse 68   Ht  (1.676 m)   Wt 184 lb 3.2 oz (83.6 kg)   BMI 29.73 kg/m  Patient is an elderly white male who is in no acute distress. Skin is warm and dry. Color is normal.  HEENT is unremarkable. Normocephalic/atraumatic. PERRL. Sclera are nonicteric. Neck is supple. No masses. No JVD. Soft bilateral carotid bruits L>R. Lungs are clear. Cardiac exam shows a regular rate and rhythm. Soft outflow murmur noted. Abdomen is soft. Extremities are with no edema. Pedal pulses are reduced. Gait and ROM are intact. No gross neurologic deficits noted.  LABORATORY DATA: Lab Results  Component Value Date   WBC 7.3 05/12/2014   HGB 13.1 05/12/2014   HCT 39.2  05/12/2014   PLT 193 05/12/2014   GLUCOSE 112 (H) 11/23/2015   CHOL 156 11/23/2015   TRIG 167 (H) 11/23/2015   HDL 43 11/23/2015   LDLCALC 80 11/23/2015   ALT 13 11/23/2015   AST 15 11/23/2015   NA 143 11/23/2015   K 5.2 11/23/2015   CL 110 11/23/2015   CREATININE 1.32 (H) 11/23/2015   BUN 24 11/23/2015   CO2 26 11/23/2015   TSH 2.921 11/12/2013   INR 1.4 06/04/2007   HGBA1C  05/31/2007    6.1 (NOTE)   The ADA recommends the following therapeutic goals for glycemic   control related to Hgb A1C measurement:   Goal of Therapy:   < 7.0% Hgb A1C   Action Suggested:  > 8.0% Hgb A1C   Ref:  Diabetes Care, 22,  Suppl. 1, 1999   Labs from primary care on 01/22/15: Cholesterol 181, triglycerides 254, HDL 44, LDL 86.   Ecg today shows NSR with a normal Ecg. I have personally reviewed and interpreted this study.  Assessment / Plan: 1. Coronary disease status post redo CABG with aortic valve replacement with a pericardial tissue valve in 2008. Myoview study in May of 2013 was normal. No significant angina at this time. Continue medical therapy and follow up in 6 months.  2. Hyperlipidemia. On lipitor with good control. Triglycerides improved with weight loss.  3. Hypertension. Blood pressure is well controlled.  4. Status post tissue aortic valve replacement. Stable by exam. Last echocardiogram in 2012 was acceptable.  5.  Carotid arterial disease. 40-59% LICA stenosis. No symptoms.   6. Edema. Improved with sodium restriction. Weight is down 4 lbs.   7. Claudication. ABI 0.95 bilateral. Recommend walking.

## 2015-11-30 ENCOUNTER — Encounter: Payer: Self-pay | Admitting: Cardiology

## 2015-11-30 ENCOUNTER — Ambulatory Visit (INDEPENDENT_AMBULATORY_CARE_PROVIDER_SITE_OTHER): Payer: PPO | Admitting: Cardiology

## 2015-11-30 VITALS — BP 136/64 | HR 68 | Ht 66.0 in | Wt 184.2 lb

## 2015-11-30 DIAGNOSIS — I10 Essential (primary) hypertension: Secondary | ICD-10-CM

## 2015-11-30 DIAGNOSIS — Z953 Presence of xenogenic heart valve: Secondary | ICD-10-CM

## 2015-11-30 DIAGNOSIS — I739 Peripheral vascular disease, unspecified: Secondary | ICD-10-CM

## 2015-11-30 DIAGNOSIS — I482 Chronic atrial fibrillation, unspecified: Secondary | ICD-10-CM

## 2015-11-30 DIAGNOSIS — I25119 Atherosclerotic heart disease of native coronary artery with unspecified angina pectoris: Secondary | ICD-10-CM

## 2015-11-30 DIAGNOSIS — E785 Hyperlipidemia, unspecified: Secondary | ICD-10-CM | POA: Diagnosis not present

## 2015-11-30 NOTE — Addendum Note (Signed)
Addended by: Neoma Laming on: 11/30/2015 09:33 AM   Modules accepted: Orders

## 2015-11-30 NOTE — Patient Instructions (Signed)
Continue your current therapy  I will see you in 6 months.   

## 2015-12-10 ENCOUNTER — Other Ambulatory Visit: Payer: Self-pay | Admitting: Cardiology

## 2015-12-10 DIAGNOSIS — I6523 Occlusion and stenosis of bilateral carotid arteries: Secondary | ICD-10-CM

## 2015-12-15 ENCOUNTER — Ambulatory Visit (HOSPITAL_COMMUNITY)
Admission: RE | Admit: 2015-12-15 | Discharge: 2015-12-15 | Disposition: A | Discharge status: Home / Self Care | Payer: PPO | Source: Ambulatory Visit | Attending: Cardiovascular Disease | Admitting: Cardiovascular Disease

## 2015-12-15 DIAGNOSIS — I2510 Atherosclerotic heart disease of native coronary artery without angina pectoris: Secondary | ICD-10-CM | POA: Diagnosis not present

## 2015-12-15 DIAGNOSIS — I10 Essential (primary) hypertension: Secondary | ICD-10-CM | POA: Diagnosis not present

## 2015-12-15 DIAGNOSIS — I6523 Occlusion and stenosis of bilateral carotid arteries: Secondary | ICD-10-CM | POA: Insufficient documentation

## 2015-12-15 DIAGNOSIS — E785 Hyperlipidemia, unspecified: Secondary | ICD-10-CM | POA: Insufficient documentation

## 2015-12-23 ENCOUNTER — Other Ambulatory Visit: Payer: Self-pay | Admitting: *Deleted

## 2015-12-23 DIAGNOSIS — I739 Peripheral vascular disease, unspecified: Principal | ICD-10-CM

## 2015-12-23 DIAGNOSIS — I779 Disorder of arteries and arterioles, unspecified: Secondary | ICD-10-CM

## 2016-03-15 DIAGNOSIS — H401421 Capsular glaucoma with pseudoexfoliation of lens, left eye, mild stage: Secondary | ICD-10-CM | POA: Diagnosis not present

## 2016-05-29 NOTE — Progress Notes (Signed)
[name] Date of Birth: 1924/06/28 Medical Record #[id number]  History of Present Illness: [name] is seen for follow up of CAD. He is s/p redo CABG and AVR in 2008.  He did have carotid dopplers in August 2017  which showed 40-59% bilateral ICA stenosis. This was unchanged from prior exams.  Myoview study in May 2013 was normal. Echo in 2012 showed normal LV function and mild aortic stenosis. He also had LE arterial dopplers in August 2016 which showed mild vascular disease with good ABIs.  He is 83 years old today. He reports his wife passed away in March 17, 2023. They had been married 70 years. He denies any chest pain or SOB. No palpitations. He has lost 10 lbs. No real complaints. No edema.  Current Outpatient Prescriptions on File Prior to Visit  Medication Sig Dispense Refill  . amLODipine-benazepril (LOTREL) 5-20 MG capsule Take 1 capsule by mouth 2 (two) times daily. 180 capsule 3  . aspirin (ASPIRIN CHILDRENS) 81 MG chewable tablet Chew 1 tablet (81 mg total) by mouth daily. 36 tablet 11  . atorvastatin (LIPITOR) 40 MG tablet Take 1 tablet (40 mg total) by mouth daily. 90 tablet 3  . COMBIGAN 0.2-0.5 % ophthalmic solution Place 1 drop into the left eye every 12 (twelve) hours.     . IBUPROFEN PO Take by mouth as needed.    . nitroGLYCERIN (NITROSTAT) 0.4 MG SL tablet Place 1 tablet (0.4 mg total) under the tongue every 5 (five) minutes as needed. NEED OV. 25 tablet 1  . TRAVATAN Z 0.004 % SOLN ophthalmic solution Place 1 drop into the left eye at bedtime.      No current facility-administered medications on file prior to visit.     Allergies  Allergen Reactions  . Codeine     Hallucinations  . Penicillins Hives    Past Medical History:  Diagnosis Date  . Carotid arterial disease (HCC)    Doppler in 2003 with 40 to 60% L ICA  . Carpal tunnel syndrome   . Coronary artery disease    s/p CABG & redo CABG with AVR  . Hyperlipidemia   . Hypertension   . Meningitis   .  Normal echocardiogram Sept 2012   Has normal EF, mild AS and moderate LAE  . PAF (paroxysmal atrial fibrillation) [facility]) Sept 2012   converted spontaneously  . PUD (peptic ulcer disease)    Remote history  . Status post aortic valve replacement with tissue    for aortic stenosis    Past Surgical History:  Procedure Laterality Date  . CARDIAC CATHETERIZATION  04/2007   EF 60%/severe two-vessel obstructive coronary artery disease/patent saphenous bein graft to rt coronary/ severe stenosis at the ostium of the lt internal mammary artery graft to the abtuse marginal branch/normal lt ventricular function/ severe aortic stenosis/normal rt heart pressures  . CARDIAC CATHETERIZATION  07/25/2002   EF 65%/two-vessel obstructive atherosclerotic coronary artery disease/ patent saphenous vein graft to the distal rt coronary arter//patent lt internal mammary artery graft to the obtuse marginal branch/normal lt ventricular function  . CARDIAC CATHETERIZATION  05/17/1991   EF65%/two-vessel ovstructive atherosclerotic coronary artery disease/good lt ventricular performance/compared with previous catheterization there is now total occlusion of the tr coronary artery at the prior angioplasty site/good collateral flow to the distal rt coronary artery is seen/there is also progressive disease in the lt circumflex vessel now to obstructive levels  . CARDIAC CATHETERIZATION  07/26/1990   EF60%/borderline obstructive coronary artery disease in the  mid lt circumflex & in the rt coronary artery prior angioplasty site/normal lt ventricular function  . CARDIAC CATHETERIZATION  05/23/1990   successful percutaneous transluminal coronary angioplasty of the proximal rt coronary artery  . CORONARY ANGIOPLASTY  07/12/1990   single vessel obstructive atherosclerotic coronary atrery disease in the rt corornary artery with restenosis of the primary angioplasty site/successful repeat PTCA of the proximal rt coronary arter  . CORONARY  ARTERY BYPASS GRAFT    . CORONARY ARTERY BYPASS GRAFT     x2 using a reverse saphenous vein graft to posterior descending, reseverse saphenous vein graft to lt internal mammary artery at the previous bypass to the circumflex with a no vein harvesting  . HEMORRHOID SURGERY     at age 83  . INGUINAL HERNIA REPAIR  1967  . PENILE PROSTHESIS IMPLANT  1989  . STERNOTOMY     redo median with aortic valve replacement with a pericardial tissue valve/[employer] model 3000,8323mm,serial numver [id number]  . TONSILLECTOMY     at age 83  . VASECTOMY     at age 83    History  Smoking Status  . Former Smoker  . Packs/day: 1.50  . Years: 41.00  . Types: Cigarettes, Cigars  . Quit date: 05/02/1982  Smokeless Tobacco  . Never Used    History  Alcohol Use No    Family History  Problem Relation Age of Onset  . Cancer Father 6577    intestinal  . Heart attack Mother [id number]  . Stroke Sister     Review of Systems: The review of systems is per the HPI.  All other systems were reviewed and are negative.  Physical Exam: BP (!) 146/75   Pulse 69   Ht 5\' 6"  (1.676 m)   Wt 174 lb 9.6 oz (79.2 kg)   BMI 28.18 kg/m  Patient is an elderly white male who is in no acute distress. Skin is warm and dry. Color is normal.  HEENT is unremarkable. Normocephalic/atraumatic. PERRL. Sclera are nonicteric. Neck is supple. No masses. No JVD. Soft bilateral carotid bruits L>R. Lungs are clear. Cardiac exam shows a regular rate and rhythm. Soft outflow murmur noted. Abdomen is soft. Extremities are with no edema. Pedal pulses are reduced. Gait and ROM are intact. No gross neurologic deficits noted.  LABORATORY DATA: Lab Results  Component Value Date   WBC 7.3 05/12/2014   HGB 13.1 05/12/2014   HCT 39.2 05/12/2014   PLT 193 05/12/2014   GLUCOSE 112 (H) 11/23/2015   CHOL 156 11/23/2015   TRIG 167 (H) 11/23/2015   HDL 43 11/23/2015   LDLCALC 80 11/23/2015   ALT 13 11/23/2015   AST 15 11/23/2015   NA 143  11/23/2015   K 5.2 11/23/2015   CL 110 11/23/2015   CREATININE 1.32 (H) 11/23/2015   BUN 24 11/23/2015   CO2 26 11/23/2015   TSH 2.921 11/12/2013   INR 1.4 06/04/2007   HGBA1C  05/31/2007    6.1 (NOTE)   The ADA recommends the following therapeutic goals for glycemic   control related to Hgb A1C measurement:   Goal of Therapy:   < 7.0% Hgb A1C   Action Suggested:  > 8.0% Hgb A1C   Ref:  Diabetes Care, 22, Suppl. 1, 1999   Labs from primary care on 01/22/15: Cholesterol 181, triglycerides 254, HDL 44, LDL 86.   Ecg not done today.  Assessment / Plan: 1. Coronary disease status post redo CABG with aortic valve replacement  with a pericardial tissue valve in 2008. Myoview study in May of 2013 was normal. No significant angina at this time. Continue medical therapy and follow up in 6 months.  2. Hyperlipidemia. On lipitor with good control. Triglycerides improved with weight loss. Will check fasting lab work next visit.  3. Hypertension. Blood pressure is well controlled.  4. Status post tissue aortic valve replacement. Stable by exam. Last echocardiogram in 2012 was acceptable.  5.  Carotid arterial disease. 40-59% bilateral ICA stenosis. No symptoms.   6. Edema. Resolved with sodium restriction.   7. Claudication. ABI 0.95 bilateral.   I will follow up in 6 months.

## 2016-05-30 ENCOUNTER — Encounter: Payer: Self-pay | Admitting: Cardiology

## 2016-05-30 ENCOUNTER — Ambulatory Visit (INDEPENDENT_AMBULATORY_CARE_PROVIDER_SITE_OTHER): Payer: PPO | Admitting: Cardiology

## 2016-05-30 VITALS — BP 146/75 | HR 69 | Ht 66.0 in | Wt 174.6 lb

## 2016-05-30 DIAGNOSIS — I482 Chronic atrial fibrillation, unspecified: Secondary | ICD-10-CM

## 2016-05-30 DIAGNOSIS — Z23 Encounter for immunization: Secondary | ICD-10-CM | POA: Diagnosis not present

## 2016-05-30 DIAGNOSIS — E7800 Pure hypercholesterolemia, unspecified: Secondary | ICD-10-CM

## 2016-05-30 DIAGNOSIS — I10 Essential (primary) hypertension: Secondary | ICD-10-CM

## 2016-05-30 DIAGNOSIS — I25119 Atherosclerotic heart disease of native coronary artery with unspecified angina pectoris: Secondary | ICD-10-CM

## 2016-05-30 DIAGNOSIS — Z953 Presence of xenogenic heart valve: Secondary | ICD-10-CM

## 2016-05-30 NOTE — Patient Instructions (Signed)
Continue your current therapy  I will see you in 6 months with lab work   

## 2016-06-18 ENCOUNTER — Other Ambulatory Visit: Payer: Self-pay | Admitting: Cardiology

## 2016-09-13 DIAGNOSIS — Z961 Presence of intraocular lens: Secondary | ICD-10-CM | POA: Diagnosis not present

## 2016-09-13 DIAGNOSIS — H401421 Capsular glaucoma with pseudoexfoliation of lens, left eye, mild stage: Secondary | ICD-10-CM | POA: Diagnosis not present

## 2016-11-16 DIAGNOSIS — I25119 Atherosclerotic heart disease of native coronary artery with unspecified angina pectoris: Secondary | ICD-10-CM | POA: Diagnosis not present

## 2016-11-16 DIAGNOSIS — I10 Essential (primary) hypertension: Secondary | ICD-10-CM | POA: Diagnosis not present

## 2016-11-16 DIAGNOSIS — I482 Chronic atrial fibrillation: Secondary | ICD-10-CM | POA: Diagnosis not present

## 2016-11-16 DIAGNOSIS — E7800 Pure hypercholesterolemia, unspecified: Secondary | ICD-10-CM | POA: Diagnosis not present

## 2016-11-16 DIAGNOSIS — Z953 Presence of xenogenic heart valve: Secondary | ICD-10-CM | POA: Diagnosis not present

## 2016-11-16 LAB — HEPATIC FUNCTION PANEL
ALT: 14 U/L (ref 9–46)
AST: 17 U/L (ref 10–35)
Albumin: 3.7 g/dL (ref 3.6–5.1)
Alkaline Phosphatase: 53 U/L (ref 40–115)
BILIRUBIN INDIRECT: 0.4 mg/dL (ref 0.2–1.2)
Bilirubin, Direct: 0.1 mg/dL (ref <=0.2)
TOTAL PROTEIN: 6.3 g/dL (ref 6.1–8.1)
Total Bilirubin: 0.5 mg/dL (ref 0.2–1.2)

## 2016-11-16 LAB — LIPID PANEL
CHOLESTEROL: 149 mg/dL (ref <200)
HDL: 42 mg/dL (ref >40)
LDL CALC: 78 mg/dL (ref <100)
Total CHOL/HDL Ratio: 3.5 Ratio (ref <5.0)
Triglycerides: 147 mg/dL (ref <150)
VLDL: 29 mg/dL (ref <30)

## 2016-11-16 LAB — BASIC METABOLIC PANEL
BUN: 27 mg/dL — ABNORMAL HIGH (ref 7–25)
CHLORIDE: 108 mmol/L (ref 98–110)
CO2: 24 mmol/L (ref 20–31)
Calcium: 9 mg/dL (ref 8.6–10.3)
Creat: 1.56 mg/dL — ABNORMAL HIGH (ref 0.70–1.11)
Glucose, Bld: 113 mg/dL — ABNORMAL HIGH (ref 65–99)
Potassium: 5 mmol/L (ref 3.5–5.3)
Sodium: 142 mmol/L (ref 135–146)

## 2016-11-17 NOTE — Progress Notes (Signed)
Arna SnipeAvery V Tullos Date of Birth: November 25, 1924 Medical Record #[id number]  History of Present Illness: [name] is seen for follow up of CAD. He is s/p redo CABG and AVR in 2008.  He did have carotid dopplers in August 2017  which showed 40-59% bilateral ICA stenosis. This was unchanged from prior exams.  Myoview study in May 2013 was normal. Echo in 2012 showed normal LV function and mild aortic stenosis. He also had LE arterial dopplers in August 2016 which showed mild vascular disease with good ABIs.   He is now 83 years old. His wife passed away last November. They had been married 70 years. He states he is doing ok. He states he has turned over his affairs to his son who has POA. He initially lost 10 lbs after his wife died but has gained it back. Notes more swelling when he puts salt on his melons so he has stopped this. Still eats a lot of canned and processed foods. He is not as active as before. Gets SOB walking up hill or bending over. No chest pain or palpitations. He is actually planned a trip to [place] in November.   Current Outpatient Prescriptions on File Prior to Visit  Medication Sig Dispense Refill  . amLODipine-benazepril (LOTREL) 5-20 MG capsule TAKE 1 CAPSULE BY MOUTH 2 (TWO) TIMES DAILY. 180 capsule 3  . aspirin (ASPIRIN CHILDRENS) 81 MG chewable tablet Chew 1 tablet (81 mg total) by mouth daily. 36 tablet 11  . atorvastatin (LIPITOR) 40 MG tablet TAKE 1 TABLET (40 MG TOTAL) BY MOUTH DAILY. 90 tablet 3  . COMBIGAN 0.2-0.5 % ophthalmic solution Place 1 drop into the left eye every 12 (twelve) hours.     . IBUPROFEN PO Take by mouth as needed.    . nitroGLYCERIN (NITROSTAT) 0.4 MG SL tablet Place 1 tablet (0.4 mg total) under the tongue every 5 (five) minutes as needed. NEED OV. 25 tablet 1  . TRAVATAN Z 0.004 % SOLN ophthalmic solution Place 1 drop into the left eye at bedtime.      No current facility-administered medications on file prior to visit.     Allergies  Allergen  Reactions  . Codeine     Hallucinations  . Penicillins Hives    Past Medical History:  Diagnosis Date  . Carotid arterial disease (HCC)    Doppler in 2003 with 40 to 60% L ICA  . Carpal tunnel syndrome   . Coronary artery disease    s/p CABG & redo CABG with AVR  . Hyperlipidemia   . Hypertension   . Meningitis   . Normal echocardiogram Sept 2012   Has normal EF, mild AS and moderate LAE  . PAF (paroxysmal atrial fibrillation) [facility](HCC) Sept 2012   converted spontaneously  . PUD (peptic ulcer disease)    Remote history  . Status post aortic valve replacement with tissue    for aortic stenosis    Past Surgical History:  Procedure Laterality Date  . CARDIAC CATHETERIZATION  04/2007   EF 60%/severe two-vessel obstructive coronary artery disease/patent saphenous bein graft to rt coronary/ severe stenosis at the ostium of the lt internal mammary artery graft to the abtuse marginal branch/normal lt ventricular function/ severe aortic stenosis/normal rt heart pressures  . CARDIAC CATHETERIZATION  07/25/2002   EF 65%/two-vessel obstructive atherosclerotic coronary artery disease/ patent saphenous vein graft to the distal rt coronary arter//patent lt internal mammary artery graft to the obtuse marginal branch/normal lt ventricular function  . CARDIAC CATHETERIZATION  05/17/1991   EF65%/two-vessel ovstructive atherosclerotic coronary artery disease/good lt ventricular performance/compared with previous catheterization there is now total occlusion of the tr coronary artery at the prior angioplasty site/good collateral flow to the distal rt coronary artery is seen/there is also progressive disease in the lt circumflex vessel now to obstructive levels  . CARDIAC CATHETERIZATION  07/26/1990   EF60%/borderline obstructive coronary artery disease in the mid lt circumflex & in the rt coronary artery prior angioplasty site/normal lt ventricular function  . CARDIAC CATHETERIZATION  05/23/1990   successful  percutaneous transluminal coronary angioplasty of the proximal rt coronary artery  . CORONARY ANGIOPLASTY  07/12/1990   single vessel obstructive atherosclerotic coronary atrery disease in the rt corornary artery with restenosis of the primary angioplasty site/successful repeat PTCA of the proximal rt coronary arter  . CORONARY ARTERY BYPASS GRAFT    . CORONARY ARTERY BYPASS GRAFT     x2 using a reverse saphenous vein graft to posterior descending, reseverse saphenous vein graft to lt internal mammary artery at the previous bypass to the circumflex with a no vein harvesting  . HEMORRHOID SURGERY     at age 25  . INGUINAL HERNIA REPAIR  1967  . PENILE PROSTHESIS IMPLANT  1989  . STERNOTOMY     redo median with aortic valve replacement with a pericardial tissue valve/[employer] model 3000,47mm,serial numver [id number]  . TONSILLECTOMY     at age 22  . VASECTOMY     at age 6    History  Smoking Status  . Former Smoker  . Packs/day: 1.50  . Years: 41.00  . Types: Cigarettes, Cigars  . Quit date: 05/02/1982  Smokeless Tobacco  . Never Used    History  Alcohol Use No    Family History  Problem Relation Age of Onset  . Cancer Father 93       intestinal  . Heart attack Mother 45  . Stroke Sister     Review of Systems: The review of systems is per the HPI.  All other systems were reviewed and are negative.  Physical Exam: BP (!) 157/67   Pulse 67   Ht 5\' 6"  (1.676 m)   Wt 184 lb 12.8 oz (83.8 kg)   BMI 29.83 kg/m  Patient is an elderly white male who is in no acute distress. Has a new beard.  Skin is warm and dry. Color is normal.  HEENT is unremarkable. Normocephalic/atraumatic. PERRL. Sclera are nonicteric. Neck is supple. No masses. No JVD. Soft bilateral carotid bruits L>R. Lungs are clear. Cardiac exam shows a regular rate and rhythm. Soft outflow murmur noted. Abdomen is soft. Extremities have trace pretibial edema. Pedal pulses are reduced. Gait and ROM are  intact. No gross neurologic deficits noted.  LABORATORY DATA: Lab Results  Component Value Date   WBC 7.3 05/12/2014   HGB 13.1 05/12/2014   HCT 39.2 05/12/2014   PLT 193 05/12/2014   GLUCOSE 113 (H) 11/16/2016   CHOL 149 11/16/2016   TRIG 147 11/16/2016   HDL 42 11/16/2016   LDLCALC 78 11/16/2016   ALT 14 11/16/2016   AST 17 11/16/2016   NA 142 11/16/2016   K 5.0 11/16/2016   CL 108 11/16/2016   CREATININE 1.56 (H) 11/16/2016   BUN 27 (H) 11/16/2016   CO2 24 11/16/2016   TSH 2.921 11/12/2013   INR 1.4 06/04/2007   HGBA1C  05/31/2007    6.1 (NOTE)   The ADA recommends the following therapeutic goals for  glycemic   control related to Hgb A1C measurement:   Goal of Therapy:   < 7.0% Hgb A1C   Action Suggested:  > 8.0% Hgb A1C   Ref:  Diabetes Care, 22, Suppl. 1, 1999   Ecg is done today. NSR , incomplete LBBB. No change. I have personally reviewed and interpreted this study.   Assessment / Plan: 1. Coronary disease status post redo CABG with aortic valve replacement with a pericardial tissue valve in 2008. Myoview study in May of 2013 was normal. No significant angina at this time. Continue medical therapy and follow up in 6 months.  2. Hyperlipidemia. On lipitor with good control. Continue current therapy.  3. Hypertension. Blood pressure is under fair control. Needs to restrict salt intake more.   4. Status post tissue aortic valve replacement. Stable by exam. Last echocardiogram in 2012 was acceptable.  5.  Carotid arterial disease. 40-59% bilateral ICA stenosis. No symptoms.   6. Edema. Stressed importance of salt restriction.  7. Claudication. Prior ABIs look good.  I will follow up in 6 months.

## 2016-11-23 ENCOUNTER — Ambulatory Visit (INDEPENDENT_AMBULATORY_CARE_PROVIDER_SITE_OTHER): Payer: PPO | Admitting: Cardiology

## 2016-11-23 ENCOUNTER — Encounter: Payer: Self-pay | Admitting: Cardiology

## 2016-11-23 VITALS — BP 157/67 | HR 67 | Ht 66.0 in | Wt 184.8 lb

## 2016-11-23 DIAGNOSIS — Z953 Presence of xenogenic heart valve: Secondary | ICD-10-CM | POA: Diagnosis not present

## 2016-11-23 DIAGNOSIS — I25119 Atherosclerotic heart disease of native coronary artery with unspecified angina pectoris: Secondary | ICD-10-CM | POA: Diagnosis not present

## 2016-11-23 DIAGNOSIS — I6523 Occlusion and stenosis of bilateral carotid arteries: Secondary | ICD-10-CM

## 2016-11-23 DIAGNOSIS — E7800 Pure hypercholesterolemia, unspecified: Secondary | ICD-10-CM | POA: Diagnosis not present

## 2016-11-23 NOTE — Patient Instructions (Addendum)
You need to limit your salt intake more. Avoid canned foods, processed meats, chips, crackers, etc.  Continue your current therapy  I will see you in 6 months.

## 2017-01-11 ENCOUNTER — Ambulatory Visit (HOSPITAL_COMMUNITY)
Admission: RE | Admit: 2017-01-11 | Discharge: 2017-01-11 | Disposition: A | Discharge status: Home / Self Care | Payer: PPO | Source: Ambulatory Visit | Attending: Cardiovascular Disease | Admitting: Cardiovascular Disease

## 2017-01-11 DIAGNOSIS — I739 Peripheral vascular disease, unspecified: Secondary | ICD-10-CM

## 2017-01-11 DIAGNOSIS — I6523 Occlusion and stenosis of bilateral carotid arteries: Secondary | ICD-10-CM | POA: Diagnosis not present

## 2017-01-11 DIAGNOSIS — I779 Disorder of arteries and arterioles, unspecified: Secondary | ICD-10-CM | POA: Insufficient documentation

## 2017-03-14 DIAGNOSIS — H401421 Capsular glaucoma with pseudoexfoliation of lens, left eye, mild stage: Secondary | ICD-10-CM | POA: Diagnosis not present

## 2017-05-29 NOTE — Progress Notes (Signed)
[name] Date of Birth: 09/05/1924 Medical Record #[id number]  History of Present Illness: Rage is seen for follow up of CAD. He is s/p redo CABG and AVR in 2008.  He did have carotid dopplers in August 2017  which showed 40-59% bilateral ICA stenosis. This was unchanged from prior exams.  Myoview study in May 2013 was normal. Echo in 2012 showed normal LV function and mild aortic stenosis. He also had LE arterial dopplers in August 2016 which showed mild vascular disease with good ABIs.   He is now 84 years old. He is widowed.  He states he has turned over his affairs to his son who has POA. He was able to go on a trip to [place] in November and did do a lot of walking.  Gets SOB walking up hill or bending over. No chest pain or palpitations. He feels pretty good.  Current Outpatient Medications on File Prior to Visit  Medication Sig Dispense Refill  . amLODipine-benazepril (LOTREL) 5-20 MG capsule TAKE 1 CAPSULE BY MOUTH 2 (TWO) TIMES DAILY. 180 capsule 3  . aspirin (ASPIRIN CHILDRENS) 81 MG chewable tablet Chew 1 tablet (81 mg total) by mouth daily. 36 tablet 11  . atorvastatin (LIPITOR) 40 MG tablet TAKE 1 TABLET (40 MG TOTAL) BY MOUTH DAILY. 90 tablet 3  . COMBIGAN 0.2-0.5 % ophthalmic solution Place 1 drop into the left eye every 12 (twelve) hours.     . IBUPROFEN PO Take by mouth as needed.    . nitroGLYCERIN (NITROSTAT) 0.4 MG SL tablet Place 1 tablet (0.4 mg total) under the tongue every 5 (five) minutes as needed. NEED OV. 25 tablet 1  . TRAVATAN Z 0.004 % SOLN ophthalmic solution Place 1 drop into the left eye at bedtime.      No current facility-administered medications on file prior to visit.     Allergies  Allergen Reactions  . Codeine     Hallucinations  . Penicillins Hives    Past Medical History:  Diagnosis Date  . Carotid arterial disease (HCC)    Doppler in 2003 with 40 to 60% L ICA  . Carpal tunnel syndrome   . Coronary artery disease    s/p CABG & redo  CABG with AVR  . Hyperlipidemia   . Hypertension   . Meningitis   . Normal echocardiogram Sept 2012   Has normal EF, mild AS and moderate LAE  . PAF (paroxysmal atrial fibrillation) [facility]) Sept 2012   converted spontaneously  . PUD (peptic ulcer disease)    Remote history  . Status post aortic valve replacement with tissue    for aortic stenosis    Past Surgical History:  Procedure Laterality Date  . CARDIAC CATHETERIZATION  04/2007   EF 60%/severe two-vessel obstructive coronary artery disease/patent saphenous bein graft to rt coronary/ severe stenosis at the ostium of the lt internal mammary artery graft to the abtuse marginal branch/normal lt ventricular function/ severe aortic stenosis/normal rt heart pressures  . CARDIAC CATHETERIZATION  07/25/2002   EF 65%/two-vessel obstructive atherosclerotic coronary artery disease/ patent saphenous vein graft to the distal rt coronary arter//patent lt internal mammary artery graft to the obtuse marginal branch/normal lt ventricular function  . CARDIAC CATHETERIZATION  05/17/1991   EF65%/two-vessel ovstructive atherosclerotic coronary artery disease/good lt ventricular performance/compared with previous catheterization there is now total occlusion of the tr coronary artery at the prior angioplasty site/good collateral flow to the distal rt coronary artery is seen/there is also progressive disease in  the lt circumflex vessel now to obstructive levels  . CARDIAC CATHETERIZATION  07/26/1990   EF60%/borderline obstructive coronary artery disease in the mid lt circumflex & in the rt coronary artery prior angioplasty site/normal lt ventricular function  . CARDIAC CATHETERIZATION  05/23/1990   successful percutaneous transluminal coronary angioplasty of the proximal rt coronary artery  . CORONARY ANGIOPLASTY  07/12/1990   single vessel obstructive atherosclerotic coronary atrery disease in the rt corornary artery with restenosis of the primary angioplasty  site/successful repeat PTCA of the proximal rt coronary arter  . CORONARY ARTERY BYPASS GRAFT    . CORONARY ARTERY BYPASS GRAFT     x2 using a reverse saphenous vein graft to posterior descending, reseverse saphenous vein graft to lt internal mammary artery at the previous bypass to the circumflex with a no vein harvesting  . HEMORRHOID SURGERY     at age 84  . INGUINAL HERNIA REPAIR  1967  . PENILE PROSTHESIS IMPLANT  1989  . STERNOTOMY     redo median with aortic valve replacement with a pericardial tissue valve/[employer] model 3000,8023mm,serial numver [id number]  . TONSILLECTOMY     at age 84  . VASECTOMY     at age 84    Social History   Tobacco Use  Smoking Status Former Games developermoker  . Packs/day: 1.50  . Years: 41.00  . Pack years: 61.50  . Types: Cigarettes, Cigars  . Last attempt to quit: 05/02/1982  . Years since quitting: 35.1  Smokeless Tobacco Never Used    Social History   Substance and Sexual Activity  Alcohol Use No    Family History  Problem Relation Age of Onset  . Cancer Father [id number]       intestinal  . Heart attack Mother [id number]  . Stroke Sister     Review of Systems: The review of systems is per the HPI.  All other systems were reviewed and are negative.  Physical Exam: BP (!) 148/64   Pulse 77   Ht 5\' 6"  (1.676 m)   Wt 188 lb 9.6 oz (85.5 kg)   BMI 30.44 kg/m  GENERAL:  Well appearing, elderly WM in NAD HEENT:  PERRL, EOMI, sclera are clear. Oropharynx is clear. NECK:  No jugular venous distention, carotid upstroke brisk and symmetric, no bruits, no thyromegaly or adenopathy LUNGS:  Clear to auscultation bilaterally CHEST:  Unremarkable HEART:  RRR,  PMI not displaced or sustained,S1 and S2 within normal limits, no S3, no S4: no clicks, no rubs, no murmurs ABD:  Soft, nontender. BS +, no masses or bruits. No hepatomegaly, no splenomegaly EXT:  2 + pulses throughout, Tr edema, no cyanosis no clubbing SKIN:  Warm and dry.  No rashes NEURO:   Alert and oriented x 3. Cranial nerves II through XII intact. PSYCH:  Cognitively intact    LABORATORY DATA: Lab Results  Component Value Date   WBC 7.3 05/12/2014   HGB 13.1 05/12/2014   HCT 39.2 05/12/2014   PLT 193 05/12/2014   GLUCOSE 113 (H) 11/16/2016   CHOL 149 11/16/2016   TRIG 147 11/16/2016   HDL 42 11/16/2016   LDLCALC 78 11/16/2016   ALT 14 11/16/2016   AST 17 11/16/2016   NA 142 11/16/2016   K 5.0 11/16/2016   CL 108 11/16/2016   CREATININE 1.56 (H) 11/16/2016   BUN 27 (H) 11/16/2016   CO2 24 11/16/2016   TSH 2.921 11/12/2013   INR 1.4 06/04/2007   HGBA1C  05/31/2007  6.1 (NOTE)   The ADA recommends the following therapeutic goals for glycemic   control related to Hgb A1C measurement:   Goal of Therapy:   < 7.0% Hgb A1C   Action Suggested:  > 8.0% Hgb A1C   Ref:  Diabetes Care, 22, Suppl. 1, 1999     Assessment / Plan: 1. Coronary disease status post redo CABG with aortic valve replacement with a pericardial tissue valve in 2008. Myoview study in May of 2013 was normal. He is asymptomatic. Continue medical therapy and follow up in 6 months with fasting lab work.  2. Hyperlipidemia. On lipitor with good control. Continue current therapy.  3. Hypertension. Blood pressure is under fair control. Continue current medication. Restrict salt intake.  4. Status post tissue aortic valve replacement. Stable by exam. Last echocardiogram in 2012 was acceptable.  5.  Carotid arterial disease. 40-59% bilateral ICA stenosis. No symptoms. Repeat dopplers in September 2018 showed no progression.  6. Edema. Stressed importance of salt restriction.  7. Claudication. Prior ABIs look good.  I will follow up in 6 months.

## 2017-06-06 ENCOUNTER — Encounter: Payer: Self-pay | Admitting: Cardiology

## 2017-06-06 ENCOUNTER — Ambulatory Visit: Payer: PPO | Admitting: Cardiology

## 2017-06-06 VITALS — BP 148/64 | HR 77 | Ht 66.0 in | Wt 188.6 lb

## 2017-06-06 DIAGNOSIS — I25119 Atherosclerotic heart disease of native coronary artery with unspecified angina pectoris: Secondary | ICD-10-CM | POA: Diagnosis not present

## 2017-06-06 DIAGNOSIS — Z953 Presence of xenogenic heart valve: Secondary | ICD-10-CM

## 2017-06-06 DIAGNOSIS — I10 Essential (primary) hypertension: Secondary | ICD-10-CM

## 2017-06-06 DIAGNOSIS — I6523 Occlusion and stenosis of bilateral carotid arteries: Secondary | ICD-10-CM | POA: Diagnosis not present

## 2017-06-06 DIAGNOSIS — I482 Chronic atrial fibrillation, unspecified: Secondary | ICD-10-CM

## 2017-06-06 NOTE — Patient Instructions (Signed)
Continue your current therapy  I will see you in 6 months with fasting lab work.   

## 2017-06-15 ENCOUNTER — Other Ambulatory Visit: Payer: Self-pay | Admitting: Cardiology

## 2017-06-15 NOTE — Telephone Encounter (Signed)
REFILL 

## 2017-08-28 DIAGNOSIS — Z961 Presence of intraocular lens: Secondary | ICD-10-CM | POA: Diagnosis not present

## 2017-08-28 DIAGNOSIS — H401421 Capsular glaucoma with pseudoexfoliation of lens, left eye, mild stage: Secondary | ICD-10-CM | POA: Diagnosis not present

## 2017-10-19 DIAGNOSIS — J189 Pneumonia, unspecified organism: Secondary | ICD-10-CM | POA: Diagnosis not present

## 2017-10-19 DIAGNOSIS — L84 Corns and callosities: Secondary | ICD-10-CM | POA: Diagnosis not present

## 2017-10-19 DIAGNOSIS — M79675 Pain in left toe(s): Secondary | ICD-10-CM | POA: Diagnosis not present

## 2017-12-22 ENCOUNTER — Other Ambulatory Visit: Payer: Self-pay | Admitting: Cardiology

## 2017-12-22 DIAGNOSIS — I6523 Occlusion and stenosis of bilateral carotid arteries: Secondary | ICD-10-CM

## 2018-01-03 DIAGNOSIS — Z953 Presence of xenogenic heart valve: Secondary | ICD-10-CM | POA: Diagnosis not present

## 2018-01-03 DIAGNOSIS — I6523 Occlusion and stenosis of bilateral carotid arteries: Secondary | ICD-10-CM | POA: Diagnosis not present

## 2018-01-03 DIAGNOSIS — I482 Chronic atrial fibrillation: Secondary | ICD-10-CM | POA: Diagnosis not present

## 2018-01-03 DIAGNOSIS — I10 Essential (primary) hypertension: Secondary | ICD-10-CM | POA: Diagnosis not present

## 2018-01-03 DIAGNOSIS — I25119 Atherosclerotic heart disease of native coronary artery with unspecified angina pectoris: Secondary | ICD-10-CM | POA: Diagnosis not present

## 2018-01-03 LAB — LIPID PANEL W/O CHOL/HDL RATIO
CHOLESTEROL TOTAL: 185 mg/dL (ref 100–199)
HDL: 44 mg/dL (ref >39)
LDL Calculated: 109 mg/dL — ABNORMAL HIGH (ref 0–99)
Triglycerides: 159 mg/dL — ABNORMAL HIGH (ref 0–149)
VLDL CHOLESTEROL CAL: 32 mg/dL (ref 5–40)

## 2018-01-03 LAB — BASIC METABOLIC PANEL
BUN/Creatinine Ratio: 16 (ref 10–24)
BUN: 25 mg/dL (ref 10–36)
CHLORIDE: 103 mmol/L (ref 96–106)
CO2: 22 mmol/L (ref 20–29)
Calcium: 9 mg/dL (ref 8.6–10.2)
Creatinine, Ser: 1.58 mg/dL — ABNORMAL HIGH (ref 0.76–1.27)
GFR calc Af Amer: 44 mL/min/{1.73_m2} — ABNORMAL LOW (ref >59)
GFR calc non Af Amer: 38 mL/min/{1.73_m2} — ABNORMAL LOW (ref >59)
GLUCOSE: 110 mg/dL — AB (ref 65–99)
Potassium: 4.9 mmol/L (ref 3.5–5.2)
Sodium: 141 mmol/L (ref 134–144)

## 2018-01-03 LAB — HEPATIC FUNCTION PANEL
ALK PHOS: 67 IU/L (ref 39–117)
ALT: 20 IU/L (ref 0–44)
AST: 24 IU/L (ref 0–40)
Albumin: 4.1 g/dL (ref 3.2–4.6)
BILIRUBIN TOTAL: 0.4 mg/dL (ref 0.0–1.2)
Bilirubin, Direct: 0.11 mg/dL (ref 0.00–0.40)
Total Protein: 6.4 g/dL (ref 6.0–8.5)

## 2018-01-06 NOTE — Progress Notes (Signed)
[name] Date of Birth: 1925/04/27 Medical Record #[id number]  History of Present Illness: [name] is seen for follow up of CAD. He is s/p redo CABG and AVR in 2008.  He did have carotid dopplers in August 2017  which showed 40-59% bilateral ICA stenosis. This was unchanged from prior exams.  Myoview study in May 2013 was normal. Echo in 2012 showed normal LV function and mild aortic stenosis. He also had LE arterial dopplers in August 2016 which showed mild vascular disease with good ABIs.   He is now 84 years old. He is widowed.  He states he has turned over his affairs to his son who has POA. He is still active travelling. He notes since his wife passed he eats out more and eats a lot more pork and hotdogs. He thinks his swelling is better. He has lost 54 lbs.   Current Outpatient Medications on File Prior to Visit  Medication Sig Dispense Refill  . amLODipine-benazepril (LOTREL) 5-20 MG capsule TAKE 1 CAPSULE BY MOUTH 2 (TWO) TIMES DAILY. 180 capsule 1  . aspirin (ASPIRIN CHILDRENS) 81 MG chewable tablet Chew 1 tablet (81 mg total) by mouth daily. 36 tablet 11  . atorvastatin (LIPITOR) 40 MG tablet TAKE 1 TABLET (40 MG TOTAL) BY MOUTH DAILY. 90 tablet 1  . COMBIGAN 0.2-0.5 % ophthalmic solution Place 1 drop into the left eye every 12 (twelve) hours.     . IBUPROFEN PO Take by mouth as needed.    . nitroGLYCERIN (NITROSTAT) 0.4 MG SL tablet Place 1 tablet (0.4 mg total) under the tongue every 5 (five) minutes as needed. NEED OV. 25 tablet 1  . TRAVATAN Z 0.004 % SOLN ophthalmic solution Place 1 drop into the left eye at bedtime.      No current facility-administered medications on file prior to visit.     Allergies  Allergen Reactions  . Codeine     Hallucinations  . Penicillins Hives    Past Medical History:  Diagnosis Date  . Carotid arterial disease (HCC)    Doppler in 2003 with 40 to 60% L ICA  . Carpal tunnel syndrome   . Coronary artery disease    s/p CABG & redo  CABG with AVR  . Hyperlipidemia   . Hypertension   . Meningitis   . Normal echocardiogram Sept 2012   Has normal EF, mild AS and moderate LAE  . PAF (paroxysmal atrial fibrillation) [facility]) Sept 2012   converted spontaneously  . PUD (peptic ulcer disease)    Remote history  . Status post aortic valve replacement with tissue    for aortic stenosis    Past Surgical History:  Procedure Laterality Date  . CARDIAC CATHETERIZATION  04/2007   EF 60%/severe two-vessel obstructive coronary artery disease/patent saphenous bein graft to rt coronary/ severe stenosis at the ostium of the lt internal mammary artery graft to the abtuse marginal branch/normal lt ventricular function/ severe aortic stenosis/normal rt heart pressures  . CARDIAC CATHETERIZATION  07/25/2002   EF 65%/two-vessel obstructive atherosclerotic coronary artery disease/ patent saphenous vein graft to the distal rt coronary arter//patent lt internal mammary artery graft to the obtuse marginal branch/normal lt ventricular function  . CARDIAC CATHETERIZATION  05/17/1991   EF65%/two-vessel ovstructive atherosclerotic coronary artery disease/good lt ventricular performance/compared with previous catheterization there is now total occlusion of the tr coronary artery at the prior angioplasty site/good collateral flow to the distal rt coronary artery is seen/there is also progressive disease in the lt  circumflex vessel now to obstructive levels  . CARDIAC CATHETERIZATION  07/26/1990   EF60%/borderline obstructive coronary artery disease in the mid lt circumflex & in the rt coronary artery prior angioplasty site/normal lt ventricular function  . CARDIAC CATHETERIZATION  05/23/1990   successful percutaneous transluminal coronary angioplasty of the proximal rt coronary artery  . CORONARY ANGIOPLASTY  07/12/1990   single vessel obstructive atherosclerotic coronary atrery disease in the rt corornary artery with restenosis of the primary angioplasty  site/successful repeat PTCA of the proximal rt coronary arter  . CORONARY ARTERY BYPASS GRAFT    . CORONARY ARTERY BYPASS GRAFT     x2 using a reverse saphenous vein graft to posterior descending, reseverse saphenous vein graft to lt internal mammary artery at the previous bypass to the circumflex with a no vein harvesting  . HEMORRHOID SURGERY     at age 54  . INGUINAL HERNIA REPAIR  1967  . PENILE PROSTHESIS IMPLANT  1989  . STERNOTOMY     redo median with aortic valve replacement with a pericardial tissue valve/[employer] model 3000,22mm,serial numver [id number]  . TONSILLECTOMY     at age 36  . VASECTOMY     at age 73    Social History   Tobacco Use  Smoking Status Former Games developer  . Packs/day: 1.50  . Years: 41.00  . Pack years: 61.50  . Types: Cigarettes, Cigars  . Last attempt to quit: 05/02/1982  . Years since quitting: 35.7  Smokeless Tobacco Never Used    Social History   Substance and Sexual Activity  Alcohol Use No    Family History  Problem Relation Age of Onset  . Cancer Father 81       intestinal  . Heart attack Mother 6  . Stroke Sister     Review of Systems: The review of systems is per the HPI.  All other systems were reviewed and are negative.  Physical Exam: BP 116/60 (BP Location: Left Arm, Patient Position: Sitting, Cuff Size: Normal)   Pulse 71   Ht 5\' 6"  (1.676 m)   Wt 183 lb (83 kg)   BMI 29.54 kg/m  GENERAL:  Well appearing elderly WM in NAD HEENT:  PERRL, EOMI, sclera are clear. Oropharynx is clear. NECK:  No jugular venous distention,soft bilateral bruits, no thyromegaly or adenopathy LUNGS:  Clear to auscultation bilaterally CHEST:  Unremarkable HEART:  RRR,  PMI not displaced or sustained,S1 and S2 within normal limits, no S3, no S4: no clicks, no rubs, soft systolic murmur RUSB ABD:  Soft, nontender. BS +, no masses or bruits. No hepatomegaly, no splenomegaly EXT:  2 + pulses throughout, Tr 1+ edema, no cyanosis no  clubbing SKIN:  Warm and dry.  No rashes NEURO:  Alert and oriented x 3. Cranial nerves II through XII intact. PSYCH:  Cognitively intact       LABORATORY DATA: Lab Results  Component Value Date   WBC 7.3 05/12/2014   HGB 13.1 05/12/2014   HCT 39.2 05/12/2014   PLT 193 05/12/2014   GLUCOSE 110 (H) 01/03/2018   CHOL 185 01/03/2018   TRIG 159 (H) 01/03/2018   HDL 44 01/03/2018   LDLCALC 109 (H) 01/03/2018   ALT 20 01/03/2018   AST 24 01/03/2018   NA 141 01/03/2018   K 4.9 01/03/2018   CL 103 01/03/2018   CREATININE 1.58 (H) 01/03/2018   BUN 25 01/03/2018   CO2 22 01/03/2018   TSH 2.921 11/12/2013   INR 1.4  06/04/2007   HGBA1C  05/31/2007    6.1 (NOTE)   The ADA recommends the following therapeutic goals for glycemic   control related to Hgb A1C measurement:   Goal of Therapy:   < 7.0% Hgb A1C   Action Suggested:  > 8.0% Hgb A1C   Ref:  Diabetes Care, 22, Suppl. 1, 1999    Ecg today shows NSR with PACs. Incomplete LBBB. I have personally reviewed and interpreted this study.  Assessment / Plan: 1. Coronary disease status post redo CABG with aortic valve replacement with a pericardial tissue valve in 2008. Myoview study in May of 2013 was normal. He is asymptomatic. Continue medical therapy.  2. Hyperlipidemia. Recent labs show increase in LDL to 109. This is mainly due to dietary changes. We discussed making healthier eating choices even if eating out. Try and limit sodium intake as well.  3. Hypertension. Blood pressure is under good control. Continue current medication. Restrict salt intake.  4. Status post tissue aortic valve replacement. Stable by exam. Last echocardiogram in 2012 was acceptable.  5.  Carotid arterial disease. 40-59% bilateral ICA stenosis. No symptoms. Repeat dopplers in September 2018 showed no progression.  6. Edema. Stressed importance of salt restriction. Actually looks better despite his diet.   7. Claudication. Prior ABIs look good.  I  will follow up in 6 months.

## 2018-01-10 ENCOUNTER — Encounter: Payer: Self-pay | Admitting: Cardiology

## 2018-01-10 ENCOUNTER — Ambulatory Visit (INDEPENDENT_AMBULATORY_CARE_PROVIDER_SITE_OTHER): Payer: PPO | Admitting: Cardiology

## 2018-01-10 VITALS — BP 116/60 | HR 71 | Ht 66.0 in | Wt 183.0 lb

## 2018-01-10 DIAGNOSIS — Z953 Presence of xenogenic heart valve: Secondary | ICD-10-CM | POA: Diagnosis not present

## 2018-01-10 DIAGNOSIS — I6523 Occlusion and stenosis of bilateral carotid arteries: Secondary | ICD-10-CM

## 2018-01-10 DIAGNOSIS — I25119 Atherosclerotic heart disease of native coronary artery with unspecified angina pectoris: Secondary | ICD-10-CM | POA: Diagnosis not present

## 2018-01-10 DIAGNOSIS — I10 Essential (primary) hypertension: Secondary | ICD-10-CM

## 2018-01-10 NOTE — Patient Instructions (Signed)
Continue your current therapy  Try and make healthier eating choices.

## 2018-01-12 ENCOUNTER — Other Ambulatory Visit: Payer: Self-pay | Admitting: Cardiology

## 2018-01-16 ENCOUNTER — Ambulatory Visit (HOSPITAL_COMMUNITY)
Admission: RE | Admit: 2018-01-16 | Discharge: 2018-01-16 | Disposition: A | Discharge status: Home / Self Care | Payer: PPO | Source: Ambulatory Visit | Attending: Cardiology | Admitting: Cardiology

## 2018-01-16 DIAGNOSIS — I6523 Occlusion and stenosis of bilateral carotid arteries: Secondary | ICD-10-CM

## 2018-02-17 ENCOUNTER — Other Ambulatory Visit: Payer: Self-pay | Admitting: Cardiology

## 2018-02-27 DIAGNOSIS — H401421 Capsular glaucoma with pseudoexfoliation of lens, left eye, mild stage: Secondary | ICD-10-CM | POA: Diagnosis not present

## 2018-07-19 ENCOUNTER — Telehealth: Payer: Self-pay

## 2018-07-19 NOTE — Telephone Encounter (Signed)
COVID-19 Pre-Screening Questions:  Provider: JORDAN   Needs f/u  >6WEEKS  . Have you been in contact with someone that was recently sick with fever/cough or confirmed to have the Covid19 virus?  NO  *Contact with a confirmed case should stay at home, away from confirmed patient, monitor symptoms, and reach out to PCP for e-visit/additional testing.  2. Do you have any of the following symptoms [cough, fever (100.4 or greater)], and/or shortness of breath)?  NO  *ALL PTS W/ FEVER SHOULD BE REFERRED TO PCP FOR E-VISIT* _________________________________________________  Cardiac Questionnaire:    Since your last visit or hospitalization:    1. Have you been having chest pain? NO   2. Have you been having shortness of breath? NO   3. Have you been having increasing edema, wt gain, or increase in abdominal girth (pants fitting more tightly)? NO   4. Have you had any passing out spells? NO    *A YES to any of these questions would result in the appointment being kept.  *If all the answers to these questions are NO, we should indicate that given the current situation regarding the worldwide coronarvirus pandemic, at the recommendation of the CDC, we are looking to limit gatherings in our waiting area, and thus will reschedule their appointment beyond four weeks from today.    

## 2018-07-23 ENCOUNTER — Ambulatory Visit: Payer: PPO | Admitting: Cardiology

## 2018-07-23 NOTE — Telephone Encounter (Signed)
   Primary Cardiologist:  Dr.Jordan.   Patient contacted.  History reviewed.  No symptoms to suggest any unstable cardiac conditions.  Based on discussion, with current pandemic situation, we will be postponing this 07/23/18 appointment for [name]. If symptoms change, he has been instructed to contact our office.   Appointment rescheduled to 10/02/18 at 8:40 am.  Neoma Laming, LPN  12/10/313 9:45 PM         .

## 2018-08-10 ENCOUNTER — Other Ambulatory Visit: Payer: Self-pay | Admitting: Cardiology

## 2018-08-10 NOTE — Telephone Encounter (Signed)
Atorvastatin 40 mg refilled. 

## 2018-09-28 ENCOUNTER — Telehealth: Payer: Self-pay | Admitting: Cardiology

## 2018-09-28 NOTE — Progress Notes (Signed)
Virtual Visit via Telephone Note   This visit type was conducted due to national recommendations for restrictions regarding the COVID-19 Pandemic (e.g. social distancing) in an effort to limit this patient's exposure and mitigate transmission in our community.  Due to his co-morbid illnesses, this patient is at least at moderate risk for complications without adequate follow up.  This format is felt to be most appropriate for this patient at this time.  The patient did not have access to video technology/had technical difficulties with video requiring transitioning to audio format only (telephone).  All issues noted in this document were discussed and addressed.  No physical exam could be performed with this format.  Please refer to the patient's chart for his  consent to telehealth for [facility].   Date:  10/02/2018   ID:  [name], DOB Jun 01, 1924, MRN [id number]  Patient Location: Home Provider Location: Home  PCP:  Patient, No Pcp Per  Cardiologist:  Oaklee Esther [place] MD Electrophysiologist:  None   Evaluation Performed:  Follow-Up Visit  Chief Complaint:  CAD   History of Present Illness:    [name] is a 85 y.o. male seen for follow up of CAD. He is s/p redo CABG and AVR in 2008.  He did have carotid dopplers in August 2017  which showed 40-59% bilateral ICA stenosis. This was unchanged from prior exams.  Myoview study in May 2013 was normal. Echo in 2012 showed normal LV function and mild aortic stenosis. He also had LE arterial dopplers in August 2016 which showed mild vascular disease with good ABIs.   He is now 85 years old. He is widowed.  He states he has turned over his affairs to his son who has POA.  He is not having any angina. Does have a little dizziness. Still has some swelling. Still eats too much salt. Likes to eat barbeque. No dyspnea. Did fall chasing a squirrel and skinned his knees. Does a little work with his chain saw and weed eater.   The patient  does not have symptoms concerning for COVID-19 infection (fever, chills, cough, or new shortness of breath).    Past Medical History:  Diagnosis Date  . Carotid arterial disease (HCC)    Doppler in 2003 with 40 to 60% L ICA  . Carpal tunnel syndrome   . Coronary artery disease    s/p CABG & redo CABG with AVR  . Hyperlipidemia   . Hypertension   . Meningitis   . Normal echocardiogram Sept 2012   Has normal EF, mild AS and moderate LAE  . PAF (paroxysmal atrial fibrillation) [facility](HCC) Sept 2012   converted spontaneously  . PUD (peptic ulcer disease)    Remote history  . Status post aortic valve replacement with tissue    for aortic stenosis   Past Surgical History:  Procedure Laterality Date  . CARDIAC CATHETERIZATION  04/2007   EF 60%/severe two-vessel obstructive coronary artery disease/patent saphenous bein graft to rt coronary/ severe stenosis at the ostium of the lt internal mammary artery graft to the abtuse marginal branch/normal lt ventricular function/ severe aortic stenosis/normal rt heart pressures  . CARDIAC CATHETERIZATION  07/25/2002   EF 65%/two-vessel obstructive atherosclerotic coronary artery disease/ patent saphenous vein graft to the distal rt coronary arter//patent lt internal mammary artery graft to the obtuse marginal branch/normal lt ventricular function  . CARDIAC CATHETERIZATION  05/17/1991   EF65%/two-vessel ovstructive atherosclerotic coronary artery disease/good lt ventricular performance/compared with previous catheterization there is now  total occlusion of the tr coronary artery at the prior angioplasty site/good collateral flow to the distal rt coronary artery is seen/there is also progressive disease in the lt circumflex vessel now to obstructive levels  . CARDIAC CATHETERIZATION  07/26/1990   EF60%/borderline obstructive coronary artery disease in the mid lt circumflex & in the rt coronary artery prior angioplasty site/normal lt ventricular function  .  CARDIAC CATHETERIZATION  05/23/1990   successful percutaneous transluminal coronary angioplasty of the proximal rt coronary artery  . CORONARY ANGIOPLASTY  07/12/1990   single vessel obstructive atherosclerotic coronary atrery disease in the rt corornary artery with restenosis of the primary angioplasty site/successful repeat PTCA of the proximal rt coronary arter  . CORONARY ARTERY BYPASS GRAFT    . CORONARY ARTERY BYPASS GRAFT     x2 using a reverse saphenous vein graft to posterior descending, reseverse saphenous vein graft to lt internal mammary artery at the previous bypass to the circumflex with a no vein harvesting  . HEMORRHOID SURGERY     at age 50  . INGUINAL HERNIA REPAIR  1967  . PENILE PROSTHESIS IMPLANT  1989  . STERNOTOMY     redo median with aortic valve replacement with a pericardial tissue valve/[employer] model 3000,79mm,serial numver [id number]  . TONSILLECTOMY     at age 34  . VASECTOMY     at age 76     Current Meds  Medication Sig  . amLODipine-benazepril (LOTREL) 5-20 MG capsule Take 1 capsule by mouth daily.  Marland Kitchen aspirin (ASPIRIN CHILDRENS) 81 MG chewable tablet Chew 1 tablet (81 mg total) by mouth daily.  Marland Kitchen atorvastatin (LIPITOR) 40 MG tablet TAKE 1 TABLET (40 MG TOTAL) BY MOUTH DAILY.  Marland Kitchen COMBIGAN 0.2-0.5 % ophthalmic solution Place 1 drop into the left eye every 12 (twelve) hours.   . IBUPROFEN PO Take 3 capsules by mouth daily as needed (back pain).   . nitroGLYCERIN (NITROSTAT) 0.4 MG SL tablet Place 1 tablet (0.4 mg total) under the tongue every 5 (five) minutes as needed. NEED OV.  . TRAVATAN Z 0.004 % SOLN ophthalmic solution Place 1 drop into the left eye at bedtime.      Allergies:   Codeine and Penicillins   Social History   Tobacco Use  . Smoking status: Former Smoker    Packs/day: 1.50    Years: 41.00    Pack years: 61.50    Types: Cigarettes, Cigars    Last attempt to quit: 05/02/1982    Years since quitting: 36.4  . Smokeless tobacco:  Never Used  Substance Use Topics  . Alcohol use: No  . Drug use: No     Family Hx: The patient's family history includes Cancer (age of onset: 12) in his father; Heart attack (age of onset: 16) in his mother; Stroke in his sister.  ROS:   Please see the history of present illness.    All other systems reviewed and are negative.   Prior CV studies:   The following studies were reviewed today:  None   Labs/Other Tests and Data Reviewed:    EKG:  No ECG reviewed.  Recent Labs: 01/03/2018: ALT 20; BUN 25; Creatinine, Ser 1.58; Potassium 4.9; Sodium 141   Recent Lipid Panel Lab Results  Component Value Date/Time   CHOL 185 01/03/2018 08:45 AM   TRIG 159 (H) 01/03/2018 08:45 AM   HDL 44 01/03/2018 08:45 AM   CHOLHDL 3.5 11/16/2016 08:02 AM   LDLCALC 109 (H) 01/03/2018 08:45 AM  Wt Readings from Last 3 Encounters:  10/02/18 182 lb (82.6 kg)  01/10/18 183 lb (83 kg)  06/06/17 188 lb 9.6 oz (85.5 kg)     Objective:    Vital Signs:  Ht  (1.676 m)   Wt 182 lb (82.6 kg)   BMI 29.38 kg/m    VITAL SIGNS:  reviewed  ASSESSMENT & PLAN:    1. Coronary disease status post redo CABG with aortic valve replacement with a pericardial tissue valve in 2008. Myoview study in May of 2013 was normal. He is asymptomatic. Continue medical therapy.  2. Hyperlipidemia. last labs show increase in LDL to 109. This is mainly due to dietary changes. Eats a lot of red meat.  We discussed making healthier eating choices. Try and limit sodium intake as well.  3. Hypertension.  Continue current medication. Restrict salt intake.  4. Status post tissue aortic valve replacement. Stable by exam. Last echocardiogram in 2012 was acceptable.  5.  Carotid arterial disease. 40-59% bilateral ICA stenosis. No symptoms. Repeat dopplers in September 2019 showed no progression.  6. Edema. Stressed importance of salt restriction.   7. Claudication. Prior ABIs look good.  COVID-19 Education:  The signs and symptoms of COVID-19 were discussed with the patient and how to seek care for testing (follow up with PCP or arrange E-visit).  The importance of social distancing was discussed today.  Time:   Today, I have spent 10 minutes with the patient with telehealth technology discussing the above problems.     Medication Adjustments/Labs and Tests Ordered: Current medicines are reviewed at length with the patient today.  Concerns regarding medicines are outlined above.   Tests Ordered: No orders of the defined types were placed in this encounter.   Medication Changes: No orders of the defined types were placed in this encounter.   Disposition:  Follow up in 6 month(s)  Signed, Dylana Shaw [place], MD  10/02/2018 8:57 AM    [facility]

## 2018-09-28 NOTE — Telephone Encounter (Signed)
smartphone/ my chart declined/ consent/ pre reg completed °

## 2018-10-02 ENCOUNTER — Encounter: Payer: Self-pay | Admitting: Cardiology

## 2018-10-02 ENCOUNTER — Telehealth (INDEPENDENT_AMBULATORY_CARE_PROVIDER_SITE_OTHER): Payer: PPO | Admitting: Cardiology

## 2018-10-02 VITALS — Ht 66.0 in | Wt 182.0 lb

## 2018-10-02 DIAGNOSIS — I10 Essential (primary) hypertension: Secondary | ICD-10-CM

## 2018-10-02 DIAGNOSIS — I25119 Atherosclerotic heart disease of native coronary artery with unspecified angina pectoris: Secondary | ICD-10-CM

## 2018-10-02 DIAGNOSIS — I6523 Occlusion and stenosis of bilateral carotid arteries: Secondary | ICD-10-CM

## 2018-10-02 DIAGNOSIS — E7800 Pure hypercholesterolemia, unspecified: Secondary | ICD-10-CM

## 2018-10-02 DIAGNOSIS — Z953 Presence of xenogenic heart valve: Secondary | ICD-10-CM

## 2018-10-02 NOTE — Patient Instructions (Signed)

## 2018-10-03 DIAGNOSIS — Z961 Presence of intraocular lens: Secondary | ICD-10-CM | POA: Diagnosis not present

## 2018-10-03 DIAGNOSIS — H401421 Capsular glaucoma with pseudoexfoliation of lens, left eye, mild stage: Secondary | ICD-10-CM | POA: Diagnosis not present

## 2018-10-28 ENCOUNTER — Other Ambulatory Visit: Payer: Self-pay | Admitting: Cardiology

## 2019-03-05 ENCOUNTER — Other Ambulatory Visit: Payer: Self-pay | Admitting: Cardiology

## 2019-04-08 ENCOUNTER — Ambulatory Visit: Payer: PPO | Admitting: Cardiology

## 2019-05-23 ENCOUNTER — Ambulatory Visit: Payer: PPO | Attending: Internal Medicine

## 2019-05-23 DIAGNOSIS — Z23 Encounter for immunization: Secondary | ICD-10-CM

## 2019-05-23 NOTE — Progress Notes (Signed)
   Covid-19 Vaccination Clinic  Name:  [name]    MRN: [id number] DOB: Jun 05, 1924  05/23/2019  Mr. [name] was observed post Covid-19 immunization for 15 minutes without incidence. He was provided with Vaccine Information Sheet and instruction to access the V-Safe system.   Mr. [name] was instructed to call 911 with any severe reactions post vaccine: Marland Kitchen Difficulty breathing  . Swelling of your face and throat  . A fast heartbeat  . A bad rash all over your body  . Dizziness and weakness    Immunizations Administered    Name Date Dose VIS Date Route   Pfizer COVID-19 Vaccine 05/23/2019  1:20 PM 0.3 mL 04/12/2019 Intramuscular   Manufacturer: [employer], [employer]   Lot: [id number]   NDC: [address]-0

## 2019-06-13 ENCOUNTER — Ambulatory Visit: Payer: PPO | Attending: Internal Medicine

## 2019-06-13 DIAGNOSIS — Z23 Encounter for immunization: Secondary | ICD-10-CM | POA: Insufficient documentation

## 2019-06-13 NOTE — Progress Notes (Signed)
   Covid-19 Vaccination Clinic  Name:  AYINDE SWIM    MRN: [id number] DOB: 06-21-1924  06/13/2019  Mr. Clos was observed post Covid-19 immunization for 15 minutes without incidence. He was provided with Vaccine Information Sheet and instruction to access the V-Safe system.   Mr. Cunliffe was instructed to call 911 with any severe reactions post vaccine: Marland Kitchen Difficulty breathing  . Swelling of your face and throat  . A fast heartbeat  . A bad rash all over your body  . Dizziness and weakness    Immunizations Administered    Name Date Dose VIS Date Route   Pfizer COVID-19 Vaccine 06/13/2019  1:58 PM 0.3 mL 04/12/2019 Intramuscular   Manufacturer: [employer], [employer]   Lot: [id number]   NDC: [address]-4

## 2019-06-27 NOTE — Progress Notes (Signed)
[name] Date of Birth: July 03, 1924 Medical Record #[id number]  History of Present Illness: [name] is seen for follow up of CAD. He is s/p redo CABG and AVR in 2008.  He did have carotid dopplers in August 2017  which showed 40-59% bilateral ICA stenosis. Repeat in 2019 showed < 39% stenosis bilaterally.    Myoview study in May 2013 was normal. Echo in 2012 showed normal LV function and mild aortic stenosis. He also had LE arterial dopplers in August 2016 which showed mild vascular disease with good ABIs.   He is now 86 years old. He is widowed.  He states he has turned over his affairs to his son who has POA. He does still eat out a lot with a lot of pork products. He states swelling is stable. He denies any chest pain or SOB. Has some sinus drainage. Feels dizzy if he turns too fast.   Current Outpatient Medications on File Prior to Visit  Medication Sig Dispense Refill  . tamsulosin (FLOMAX) 0.4 MG CAPS capsule Take by mouth.    Marland Kitchen aspirin (ASPIRIN CHILDRENS) 81 MG chewable tablet Chew 1 tablet (81 mg total) by mouth daily. 36 tablet 11  . atorvastatin (LIPITOR) 40 MG tablet TAKE 1 TABLET (40 MG TOTAL) BY MOUTH DAILY. 90 tablet 2  . COMBIGAN 0.2-0.5 % ophthalmic solution Place 1 drop into the left eye every 12 (twelve) hours.     . IBUPROFEN PO Take 3 capsules by mouth daily as needed (back pain).     . nitroGLYCERIN (NITROSTAT) 0.4 MG SL tablet Place 1 tablet (0.4 mg total) under the tongue every 5 (five) minutes as needed. NEED OV. 25 tablet 1  . TRAVATAN Z 0.004 % SOLN ophthalmic solution Place 1 drop into the left eye at bedtime.      No current facility-administered medications on file prior to visit.    Allergies  Allergen Reactions  . Codeine     Hallucinations  . Penicillins Hives    Past Medical History:  Diagnosis Date  . Carotid arterial disease (HCC)    Doppler in 2003 with 40 to 60% L ICA  . Carpal tunnel syndrome   . Coronary artery disease    s/p CABG & redo  CABG with AVR  . Hyperlipidemia   . Hypertension   . Meningitis   . Normal echocardiogram Sept 2012   Has normal EF, mild AS and moderate LAE  . PAF (paroxysmal atrial fibrillation) [facility]) Sept 2012   converted spontaneously  . PUD (peptic ulcer disease)    Remote history  . Status post aortic valve replacement with tissue    for aortic stenosis    Past Surgical History:  Procedure Laterality Date  . CARDIAC CATHETERIZATION  04/2007   EF 60%/severe two-vessel obstructive coronary artery disease/patent saphenous bein graft to rt coronary/ severe stenosis at the ostium of the lt internal mammary artery graft to the abtuse marginal branch/normal lt ventricular function/ severe aortic stenosis/normal rt heart pressures  . CARDIAC CATHETERIZATION  07/25/2002   EF 65%/two-vessel obstructive atherosclerotic coronary artery disease/ patent saphenous vein graft to the distal rt coronary arter//patent lt internal mammary artery graft to the obtuse marginal branch/normal lt ventricular function  . CARDIAC CATHETERIZATION  05/17/1991   EF65%/two-vessel ovstructive atherosclerotic coronary artery disease/good lt ventricular performance/compared with previous catheterization there is now total occlusion of the tr coronary artery at the prior angioplasty site/good collateral flow to the distal rt coronary artery is seen/there is also  progressive disease in the lt circumflex vessel now to obstructive levels  . CARDIAC CATHETERIZATION  07/26/1990   EF60%/borderline obstructive coronary artery disease in the mid lt circumflex & in the rt coronary artery prior angioplasty site/normal lt ventricular function  . CARDIAC CATHETERIZATION  05/23/1990   successful percutaneous transluminal coronary angioplasty of the proximal rt coronary artery  . CORONARY ANGIOPLASTY  07/12/1990   single vessel obstructive atherosclerotic coronary atrery disease in the rt corornary artery with restenosis of the primary angioplasty  site/successful repeat PTCA of the proximal rt coronary arter  . CORONARY ARTERY BYPASS GRAFT    . CORONARY ARTERY BYPASS GRAFT     x2 using a reverse saphenous vein graft to posterior descending, reseverse saphenous vein graft to lt internal mammary artery at the previous bypass to the circumflex with a no vein harvesting  . HEMORRHOID SURGERY     at age 30  . [place]  . PENILE PROSTHESIS IMPLANT  1989  . STERNOTOMY     redo median with aortic valve replacement with a pericardial tissue valve/[employer] model 3000,5mm,serial numver [id number]  . TONSILLECTOMY     at age 67  . VASECTOMY     at age 79    Social History   Tobacco Use  Smoking Status Former Research scientist (life sciences)  . Packs/day: 1.50  . Years: 41.00  . Pack years: 61.50  . Types: Cigarettes, Cigars  . Quit date: 05/02/1982  . Years since quitting: 37.1  Smokeless Tobacco Never Used    Social History   Substance and Sexual Activity  Alcohol Use No    Family History  Problem Relation Age of Onset  . Cancer Father 62       intestinal  . Heart attack Mother 55  . Stroke Sister     Review of Systems: The review of systems is per the HPI.  All other systems were reviewed and are negative.  Physical Exam: BP (!) 142/78   Pulse (!) 59   Temp 98.1 F (36.7 C)   Ht 5\' 6"  (1.676 m)   Wt 182 lb (82.6 kg)   SpO2 99%   BMI 29.38 kg/m  GENERAL:  Well appearing elderly WM in NAD HEENT:  PERRL, EOMI, sclera are clear. Oropharynx is clear. NECK:  No jugular venous distention,soft bilateral bruits, no thyromegaly or adenopathy LUNGS:  Clear to auscultation bilaterally CHEST:  Unremarkable HEART:  RRR,  PMI not displaced or sustained,S1 and S2 within normal limits, no S3, no S4: no clicks, no rubs, soft systolic murmur RUSB ABD:  Soft, nontender. BS +, no masses or bruits. No hepatomegaly, no splenomegaly EXT:  2 + pulses throughout,  1+ edema, no cyanosis no clubbing SKIN:  Warm and dry.  No  rashes NEURO:  Alert and oriented x 3. Cranial nerves II through XII intact. PSYCH:  Cognitively intact  LABORATORY DATA: Lab Results  Component Value Date   WBC 7.3 05/12/2014   HGB 13.1 05/12/2014   HCT 39.2 05/12/2014   PLT 193 05/12/2014   GLUCOSE 110 (H) 01/03/2018   CHOL 185 01/03/2018   TRIG 159 (H) 01/03/2018   HDL 44 01/03/2018   LDLCALC 109 (H) 01/03/2018   ALT 20 01/03/2018   AST 24 01/03/2018   NA 141 01/03/2018   K 4.9 01/03/2018   CL 103 01/03/2018   CREATININE 1.58 (H) 01/03/2018   BUN 25 01/03/2018   CO2 22 01/03/2018   TSH 2.921 11/12/2013   INR  1.4 06/04/2007   HGBA1C  05/31/2007    6.1 (NOTE)   The ADA recommends the following therapeutic goals for glycemic   control related to Hgb A1C measurement:   Goal of Therapy:   < 7.0% Hgb A1C   Action Suggested:  > 8.0% Hgb A1C   Ref:  Diabetes Care, 22, Suppl. 1, 1999    Ecg today shows NSR with rate 59.  LBBB. I have personally reviewed and interpreted this study.  Assessment / Plan: 1. Coronary disease status post redo CABG with aortic valve replacement with a pericardial tissue valve in 2008. Myoview study in May of 2013 was normal. He is asymptomatic. Continue medical therapy.  2. Hyperlipidemia. On lipitor 40 mg daily. Check fasting lab today. Encourage better eating habits.   3. Hypertension. Blood pressure is under fair control. Continue current medication. Restrict salt intake.  4. Status post tissue aortic valve replacement. Stable by exam. Last echocardiogram in 2012 was acceptable.  5.  Carotid arterial disease. Mild and asymptomatic.  6. Edema. Stressed importance of sodium restriction.     I will follow up in 6 months.

## 2019-07-02 ENCOUNTER — Encounter (INDEPENDENT_AMBULATORY_CARE_PROVIDER_SITE_OTHER): Payer: Self-pay

## 2019-07-02 ENCOUNTER — Ambulatory Visit: Payer: PPO | Admitting: Cardiology

## 2019-07-02 ENCOUNTER — Encounter: Payer: Self-pay | Admitting: Cardiology

## 2019-07-02 ENCOUNTER — Other Ambulatory Visit: Payer: Self-pay

## 2019-07-02 VITALS — BP 142/78 | HR 59 | Temp 98.1°F | Ht 66.0 in | Wt 182.0 lb

## 2019-07-02 DIAGNOSIS — I25119 Atherosclerotic heart disease of native coronary artery with unspecified angina pectoris: Secondary | ICD-10-CM

## 2019-07-02 DIAGNOSIS — E7800 Pure hypercholesterolemia, unspecified: Secondary | ICD-10-CM

## 2019-07-02 DIAGNOSIS — I6523 Occlusion and stenosis of bilateral carotid arteries: Secondary | ICD-10-CM | POA: Diagnosis not present

## 2019-07-02 DIAGNOSIS — Z953 Presence of xenogenic heart valve: Secondary | ICD-10-CM

## 2019-07-02 DIAGNOSIS — I10 Essential (primary) hypertension: Secondary | ICD-10-CM | POA: Diagnosis not present

## 2019-07-02 LAB — TSH: TSH: 5.61 u[IU]/mL — ABNORMAL HIGH (ref 0.450–4.500)

## 2019-07-02 LAB — LIPID PANEL
Chol/HDL Ratio: 3.2 ratio (ref 0.0–5.0)
Cholesterol, Total: 149 mg/dL (ref 100–199)
HDL: 47 mg/dL (ref >39)
LDL Chol Calc (NIH): 81 mg/dL (ref 0–99)
Triglycerides: 116 mg/dL (ref 0–149)
VLDL Cholesterol Cal: 21 mg/dL (ref 5–40)

## 2019-07-02 LAB — CBC WITH DIFFERENTIAL/PLATELET
Basophils Absolute: 0 10*3/uL (ref 0.0–0.2)
Basos: 0 %
EOS (ABSOLUTE): 0.1 10*3/uL (ref 0.0–0.4)
Eos: 1 %
Hematocrit: 34.2 % — ABNORMAL LOW (ref 37.5–51.0)
Hemoglobin: 11.6 g/dL — ABNORMAL LOW (ref 13.0–17.7)
Immature Grans (Abs): 0 10*3/uL (ref 0.0–0.1)
Immature Granulocytes: 0 %
Lymphocytes Absolute: 2 10*3/uL (ref 0.7–3.1)
Lymphs: 29 %
MCH: 29.1 pg (ref 26.6–33.0)
MCHC: 33.9 g/dL (ref 31.5–35.7)
MCV: 86 fL (ref 79–97)
Monocytes Absolute: 0.6 10*3/uL (ref 0.1–0.9)
Monocytes: 9 %
Neutrophils Absolute: 4.2 10*3/uL (ref 1.4–7.0)
Neutrophils: 61 %
Platelets: 178 10*3/uL (ref 150–450)
RBC: 3.99 x10E6/uL — ABNORMAL LOW (ref 4.14–5.80)
RDW: 14.4 % (ref 11.6–15.4)
WBC: 7 10*3/uL (ref 3.4–10.8)

## 2019-07-02 LAB — BASIC METABOLIC PANEL
BUN/Creatinine Ratio: 17 (ref 10–24)
BUN: 26 mg/dL (ref 10–36)
CO2: 22 mmol/L (ref 20–29)
Calcium: 9.2 mg/dL (ref 8.6–10.2)
Chloride: 109 mmol/L — ABNORMAL HIGH (ref 96–106)
Creatinine, Ser: 1.49 mg/dL — ABNORMAL HIGH (ref 0.76–1.27)
GFR calc Af Amer: 46 mL/min/{1.73_m2} — ABNORMAL LOW (ref >59)
GFR calc non Af Amer: 40 mL/min/{1.73_m2} — ABNORMAL LOW (ref >59)
Glucose: 104 mg/dL — ABNORMAL HIGH (ref 65–99)
Potassium: 4.8 mmol/L (ref 3.5–5.2)
Sodium: 142 mmol/L (ref 134–144)

## 2019-07-02 LAB — HEPATIC FUNCTION PANEL
ALT: 15 IU/L (ref 0–44)
AST: 16 IU/L (ref 0–40)
Albumin: 4 g/dL (ref 3.5–4.6)
Alkaline Phosphatase: 66 IU/L (ref 39–117)
Bilirubin Total: 0.3 mg/dL (ref 0.0–1.2)
Bilirubin, Direct: 0.11 mg/dL (ref 0.00–0.40)
Total Protein: 6.3 g/dL (ref 6.0–8.5)

## 2019-07-02 LAB — HEMOGLOBIN A1C
Est. average glucose Bld gHb Est-mCnc: 128 mg/dL
Hgb A1c MFr Bld: 6.1 % — ABNORMAL HIGH (ref 4.8–5.6)

## 2019-07-02 MED ORDER — AMLODIPINE BESY-BENAZEPRIL HCL 5-20 MG PO CAPS: ORAL_CAPSULE | ORAL | 3 refills | Status: DC

## 2019-07-06 ENCOUNTER — Other Ambulatory Visit: Payer: Self-pay | Admitting: Cardiology

## 2019-07-08 ENCOUNTER — Other Ambulatory Visit: Payer: Self-pay

## 2019-07-08 DIAGNOSIS — R7989 Other specified abnormal findings of blood chemistry: Secondary | ICD-10-CM

## 2019-07-08 NOTE — Progress Notes (Signed)
tsh

## 2019-07-10 DIAGNOSIS — H903 Sensorineural hearing loss, bilateral: Secondary | ICD-10-CM | POA: Diagnosis not present

## 2019-07-22 DIAGNOSIS — H903 Sensorineural hearing loss, bilateral: Secondary | ICD-10-CM | POA: Diagnosis not present

## 2019-08-01 DIAGNOSIS — H401421 Capsular glaucoma with pseudoexfoliation of lens, left eye, mild stage: Secondary | ICD-10-CM | POA: Diagnosis not present

## 2019-09-02 DIAGNOSIS — M4856XA Collapsed vertebra, not elsewhere classified, lumbar region, initial encounter for fracture: Secondary | ICD-10-CM | POA: Diagnosis not present

## 2019-09-02 DIAGNOSIS — Z9181 History of falling: Secondary | ICD-10-CM | POA: Diagnosis not present

## 2019-09-02 DIAGNOSIS — M545 Low back pain: Secondary | ICD-10-CM | POA: Diagnosis not present

## 2019-10-01 DIAGNOSIS — R7989 Other specified abnormal findings of blood chemistry: Secondary | ICD-10-CM | POA: Diagnosis not present

## 2019-10-01 LAB — TSH+FREE T4
Free T4: 0.95 ng/dL (ref 0.82–1.77)
TSH: 4.02 u[IU]/mL (ref 0.450–4.500)

## 2019-10-07 ENCOUNTER — Telehealth: Payer: Self-pay | Admitting: Cardiology

## 2019-10-07 NOTE — Telephone Encounter (Signed)
Called patient, gave lab results.  Patient verbalized understanding. 

## 2019-10-07 NOTE — Telephone Encounter (Signed)
New Message ° ° ° °Pt is calling for lab results  ° ° °Please call back  °

## 2019-10-12 ENCOUNTER — Other Ambulatory Visit: Payer: Self-pay | Admitting: Cardiology

## 2019-12-27 NOTE — Progress Notes (Signed)
[name] Date of Birth: 01/01/1925 Medical Record #[id number]  History of Present Illness: [name] is seen for follow up of CAD. He is s/p redo CABG and AVR in 2008.  He did have carotid dopplers in August 2017  which showed 40-59% bilateral ICA stenosis. Repeat in 2019 showed < 39% stenosis bilaterally.    Myoview study in May 2013 was normal. Echo in 2012 showed normal LV function and mild aortic stenosis. He also had LE arterial dopplers in August 2016 which showed mild vascular disease with good ABIs.   He is now 86 years old. He is widowed.  He states he has turned over his affairs to his son who has POA. He does eat crackers but has cut back on pork.  He states swelling is stable. He denies any chest pain or SOB. He has chronic back pain.  Current Outpatient Medications on File Prior to Visit  Medication Sig Dispense Refill  . amLODipine-benazepril (LOTREL) 5-20 MG capsule TAKE 1 CAPSULE BY MOUTH 2 (TWO) TIMES DAILY. 180 capsule 3  . aspirin (ASPIRIN CHILDRENS) 81 MG chewable tablet Chew 1 tablet (81 mg total) by mouth daily. 36 tablet 11  . atorvastatin (LIPITOR) 40 MG tablet TAKE 1 TABLET (40 MG TOTAL) BY MOUTH DAILY. 90 tablet 2  . COMBIGAN 0.2-0.5 % ophthalmic solution Place 1 drop into the left eye every 12 (twelve) hours.     . nitroGLYCERIN (NITROSTAT) 0.4 MG SL tablet Place 1 tablet (0.4 mg total) under the tongue every 5 (five) minutes as needed. NEED OV. 25 tablet 1  . TRAVATAN Z 0.004 % SOLN ophthalmic solution Place 1 drop into the left eye at bedtime.     . tamsulosin (FLOMAX) 0.4 MG CAPS capsule Take by mouth. (Patient not taking: Reported on 12/30/2019)     No current facility-administered medications on file prior to visit.    Allergies  Allergen Reactions  . Codeine     Hallucinations  . Penicillins Hives    Past Medical History:  Diagnosis Date  . Carotid arterial disease (HCC)    Doppler in 2003 with 40 to 60% L ICA  . Carpal tunnel syndrome   .  Coronary artery disease    s/p CABG & redo CABG with AVR  . Hyperlipidemia   . Hypertension   . Meningitis   . Normal echocardiogram Sept 2012   Has normal EF, mild AS and moderate LAE  . PAF (paroxysmal atrial fibrillation) [facility]) Sept 2012   converted spontaneously  . PUD (peptic ulcer disease)    Remote history  . Status post aortic valve replacement with tissue    for aortic stenosis    Past Surgical History:  Procedure Laterality Date  . CARDIAC CATHETERIZATION  04/2007   EF 60%/severe two-vessel obstructive coronary artery disease/patent saphenous bein graft to rt coronary/ severe stenosis at the ostium of the lt internal mammary artery graft to the abtuse marginal branch/normal lt ventricular function/ severe aortic stenosis/normal rt heart pressures  . CARDIAC CATHETERIZATION  07/25/2002   EF 65%/two-vessel obstructive atherosclerotic coronary artery disease/ patent saphenous vein graft to the distal rt coronary arter//patent lt internal mammary artery graft to the obtuse marginal branch/normal lt ventricular function  . CARDIAC CATHETERIZATION  05/17/1991   EF65%/two-vessel ovstructive atherosclerotic coronary artery disease/good lt ventricular performance/compared with previous catheterization there is now total occlusion of the tr coronary artery at the prior angioplasty site/good collateral flow to the distal rt coronary artery is seen/there is also progressive  disease in the lt circumflex vessel now to obstructive levels  . CARDIAC CATHETERIZATION  07/26/1990   EF60%/borderline obstructive coronary artery disease in the mid lt circumflex & in the rt coronary artery prior angioplasty site/normal lt ventricular function  . CARDIAC CATHETERIZATION  05/23/1990   successful percutaneous transluminal coronary angioplasty of the proximal rt coronary artery  . CORONARY ANGIOPLASTY  07/12/1990   single vessel obstructive atherosclerotic coronary atrery disease in the rt corornary artery  with restenosis of the primary angioplasty site/successful repeat PTCA of the proximal rt coronary arter  . CORONARY ARTERY BYPASS GRAFT    . CORONARY ARTERY BYPASS GRAFT     x2 using a reverse saphenous vein graft to posterior descending, reseverse saphenous vein graft to lt internal mammary artery at the previous bypass to the circumflex with a no vein harvesting  . HEMORRHOID SURGERY     at age 33  . INGUINAL HERNIA REPAIR  1967  . PENILE PROSTHESIS IMPLANT  1989  . STERNOTOMY     redo median with aortic valve replacement with a pericardial tissue valve/[employer] model 3000,66mm,serial numver [id number]  . TONSILLECTOMY     at age 44  . VASECTOMY     at age 40    Social History   Tobacco Use  Smoking Status Former Games developer  . Packs/day: 1.50  . Years: 41.00  . Pack years: 61.50  . Types: Cigarettes, Cigars  . Quit date: 05/02/1982  . Years since quitting: 37.6  Smokeless Tobacco Never Used    Social History   Substance and Sexual Activity  Alcohol Use No    Family History  Problem Relation Age of Onset  . Cancer Father 75       intestinal  . Heart attack Mother 19  . Stroke Sister     Review of Systems: The review of systems is per the HPI.  All other systems were reviewed and are negative.  Physical Exam: BP 126/64   Pulse 70   Ht 5\' 6"  (1.676 m)   Wt 186 lb 12.8 oz (84.7 kg)   SpO2 96%   BMI 30.15 kg/m  GENERAL:  Well appearing elderly WM in NAD HEENT:  PERRL, EOMI, sclera are clear. Oropharynx is clear. NECK:  No jugular venous distention,soft bilateral bruits, no thyromegaly or adenopathy LUNGS:  Clear to auscultation bilaterally CHEST:  Unremarkable HEART:  RRR,  PMI not displaced or sustained,S1 and S2 within normal limits, no S3, no S4: no clicks, no rubs, soft systolic murmur RUSB ABD:  Soft, nontender. BS +, no masses or bruits. No hepatomegaly, no splenomegaly EXT:  2 + pulses throughout,  1+ pretibial edema, no cyanosis no clubbing SKIN:   Warm and dry.  No rashes NEURO:  Alert and oriented x 3. Cranial nerves II through XII intact. PSYCH:  Cognitively intact  LABORATORY DATA: Lab Results  Component Value Date   WBC 7.0 07/02/2019   HGB 11.6 (L) 07/02/2019   HCT 34.2 (L) 07/02/2019   PLT 178 07/02/2019   GLUCOSE 104 (H) 07/02/2019   CHOL 149 07/02/2019   TRIG 116 07/02/2019   HDL 47 07/02/2019   LDLCALC 81 07/02/2019   ALT 15 07/02/2019   AST 16 07/02/2019   NA 142 07/02/2019   K 4.8 07/02/2019   CL 109 (H) 07/02/2019   CREATININE 1.49 (H) 07/02/2019   BUN 26 07/02/2019   CO2 22 07/02/2019   TSH 4.020 10/01/2019   INR 1.4 06/04/2007   HGBA1C 6.1 (  H) 07/02/2019     Assessment / Plan: 1. Coronary disease status post redo CABG with aortic valve replacement with a pericardial tissue valve in 2008. Myoview study in May of 2013 was normal. He is asymptomatic. Continue medical therapy.  2. Hyperlipidemia. On lipitor 40 mg daily. Labs acceptable.  Encourage heart healthy eating.  3. Hypertension. Blood pressure is under good control. Continue current medication. Restrict salt intake.  4. Status post tissue aortic valve replacement. Stable by exam.   5.  Carotid arterial disease. Mild and asymptomatic.  6. Edema. Stable. Stressed importance of sodium restriction.     I will follow up in 6 months.

## 2019-12-30 ENCOUNTER — Ambulatory Visit: Payer: PPO | Admitting: Cardiology

## 2019-12-30 ENCOUNTER — Encounter: Payer: Self-pay | Admitting: Cardiology

## 2019-12-30 ENCOUNTER — Other Ambulatory Visit: Payer: Self-pay

## 2019-12-30 VITALS — BP 126/64 | HR 70 | Ht 66.0 in | Wt 186.8 lb

## 2019-12-30 DIAGNOSIS — E7800 Pure hypercholesterolemia, unspecified: Secondary | ICD-10-CM

## 2019-12-30 DIAGNOSIS — I10 Essential (primary) hypertension: Secondary | ICD-10-CM | POA: Diagnosis not present

## 2019-12-30 DIAGNOSIS — I25119 Atherosclerotic heart disease of native coronary artery with unspecified angina pectoris: Secondary | ICD-10-CM | POA: Diagnosis not present

## 2019-12-30 DIAGNOSIS — Z953 Presence of xenogenic heart valve: Secondary | ICD-10-CM

## 2019-12-30 DIAGNOSIS — I6523 Occlusion and stenosis of bilateral carotid arteries: Secondary | ICD-10-CM

## 2020-03-16 DIAGNOSIS — H52201 Unspecified astigmatism, right eye: Secondary | ICD-10-CM | POA: Diagnosis not present

## 2020-03-16 DIAGNOSIS — H401111 Primary open-angle glaucoma, right eye, mild stage: Secondary | ICD-10-CM | POA: Diagnosis not present

## 2020-03-16 DIAGNOSIS — H401421 Capsular glaucoma with pseudoexfoliation of lens, left eye, mild stage: Secondary | ICD-10-CM | POA: Diagnosis not present

## 2020-03-16 DIAGNOSIS — Z961 Presence of intraocular lens: Secondary | ICD-10-CM | POA: Diagnosis not present

## 2020-06-05 NOTE — Progress Notes (Signed)
[name] Date of Birth: 08/07/1924 Medical Record #[id number]  History of Present Illness: [name] is seen for follow up of CAD. He is s/p redo CABG and AVR in 2008.  He did have carotid dopplers in August 2017  which showed 40-59% bilateral ICA stenosis. Repeat in 2019 showed < 39% stenosis bilaterally.    Myoview study in May 2013 was normal. Echo in 2012 showed normal LV function and mild aortic stenosis. He also had LE arterial dopplers in August 2016 which showed mild vascular disease with good ABIs.   He is now 87 years old. He is widowed.  Still lives alone. He is seen with his son today  who has POA. He states he has cut back on dairy products. He has lost weight. Swelling is decreased. He does complain of chest heaviness at times. Unclear if this is worse with exertion. Typically lasts 15 minutes. Hasn't tried taking Ntg.  No pain or SOB.   Current Outpatient Medications on File Prior to Visit  Medication Sig Dispense Refill  . aspirin (ASPIRIN CHILDRENS) 81 MG chewable tablet Chew 1 tablet (81 mg total) by mouth daily. 36 tablet 11  . COMBIGAN 0.2-0.5 % ophthalmic solution Place 1 drop into the left eye every 12 (twelve) hours.     . TRAVATAN Z 0.004 % SOLN ophthalmic solution Place 1 drop into the left eye at bedtime.      No current facility-administered medications on file prior to visit.    Allergies  Allergen Reactions  . Codeine     Hallucinations  . Penicillins Hives    Past Medical History:  Diagnosis Date  . Carotid arterial disease (HCC)    Doppler in 2003 with 40 to 60% L ICA  . Carpal tunnel syndrome   . Coronary artery disease    s/p CABG & redo CABG with AVR  . Hyperlipidemia   . Hypertension   . Meningitis   . Normal echocardiogram Sept 2012   Has normal EF, mild AS and moderate LAE  . PAF (paroxysmal atrial fibrillation) [facility]) Sept 2012   converted spontaneously  . PUD (peptic ulcer disease)    Remote history  . Status post aortic valve  replacement with tissue    for aortic stenosis    Past Surgical History:  Procedure Laterality Date  . CARDIAC CATHETERIZATION  04/2007   EF 60%/severe two-vessel obstructive coronary artery disease/patent saphenous bein graft to rt coronary/ severe stenosis at the ostium of the lt internal mammary artery graft to the abtuse marginal branch/normal lt ventricular function/ severe aortic stenosis/normal rt heart pressures  . CARDIAC CATHETERIZATION  07/25/2002   EF 65%/two-vessel obstructive atherosclerotic coronary artery disease/ patent saphenous vein graft to the distal rt coronary arter//patent lt internal mammary artery graft to the obtuse marginal branch/normal lt ventricular function  . CARDIAC CATHETERIZATION  05/17/1991   EF65%/two-vessel ovstructive atherosclerotic coronary artery disease/good lt ventricular performance/compared with previous catheterization there is now total occlusion of the tr coronary artery at the prior angioplasty site/good collateral flow to the distal rt coronary artery is seen/there is also progressive disease in the lt circumflex vessel now to obstructive levels  . CARDIAC CATHETERIZATION  07/26/1990   EF60%/borderline obstructive coronary artery disease in the mid lt circumflex & in the rt coronary artery prior angioplasty site/normal lt ventricular function  . CARDIAC CATHETERIZATION  05/23/1990   successful percutaneous transluminal coronary angioplasty of the proximal rt coronary artery  . CORONARY ANGIOPLASTY  07/12/1990   single  vessel obstructive atherosclerotic coronary atrery disease in the rt corornary artery with restenosis of the primary angioplasty site/successful repeat PTCA of the proximal rt coronary arter  . CORONARY ARTERY BYPASS GRAFT    . CORONARY ARTERY BYPASS GRAFT     x2 using a reverse saphenous vein graft to posterior descending, reseverse saphenous vein graft to lt internal mammary artery at the previous bypass to the circumflex with a no  vein harvesting  . HEMORRHOID SURGERY     at age 75  . INGUINAL HERNIA REPAIR  1967  . PENILE PROSTHESIS IMPLANT  1989  . STERNOTOMY     redo median with aortic valve replacement with a pericardial tissue valve/[employer] model 3000,48mm,serial numver [id number]  . TONSILLECTOMY     at age 60  . VASECTOMY     at age 81    Social History   Tobacco Use  Smoking Status Former Games developer  . Packs/day: 1.50  . Years: 41.00  . Pack years: 61.50  . Types: Cigarettes, Cigars  . Quit date: 05/02/1982  . Years since quitting: 38.1  Smokeless Tobacco Never Used    Social History   Substance and Sexual Activity  Alcohol Use No    Family History  Problem Relation Age of Onset  . Cancer Father 62       intestinal  . Heart attack Mother 45  . Stroke Sister     Review of Systems: The review of systems is per the HPI.  All other systems were reviewed and are negative.  Physical Exam: BP 120/62   Pulse 75   Ht 5\' 6"  (1.676 m)   Wt 178 lb 6.4 oz (80.9 kg)   BMI 28.79 kg/m  GENERAL:  Well appearing elderly WM in NAD HEENT:  PERRL, EOMI, sclera are clear. Oropharynx is clear. NECK:  No jugular venous distention,soft bilateral bruits, no thyromegaly or adenopathy LUNGS:  Clear to auscultation bilaterally CHEST:  Unremarkable HEART:  RRR,  PMI not displaced or sustained,S1 and S2 within normal limits, no S3, no S4: no clicks, no rubs, soft systolic murmur RUSB ABD:  Soft, nontender. BS +, no masses or bruits. No hepatomegaly, no splenomegaly EXT:  2 + pulses throughout,  Trace right  pretibial edema, no cyanosis no clubbing SKIN:  Warm and dry.  No rashes NEURO:  Alert and oriented x 3. Cranial nerves II through XII intact. PSYCH:  Cognitively intact  LABORATORY DATA: Lab Results  Component Value Date   WBC 7.0 07/02/2019   HGB 11.6 (L) 07/02/2019   HCT 34.2 (L) 07/02/2019   PLT 178 07/02/2019   GLUCOSE 104 (H) 07/02/2019   CHOL 149 07/02/2019   TRIG 116 07/02/2019    HDL 47 07/02/2019   LDLCALC 81 07/02/2019   ALT 15 07/02/2019   AST 16 07/02/2019   NA 142 07/02/2019   K 4.8 07/02/2019   CL 109 (H) 07/02/2019   CREATININE 1.49 (H) 07/02/2019   BUN 26 07/02/2019   CO2 22 07/02/2019   TSH 4.020 10/01/2019   INR 1.4 06/04/2007   HGBA1C 6.1 (H) 07/02/2019   Ecg today shows NSR rate 75. Incomplete LBBB. Inferolateral ST-T wave changes c/w ischemia. Unchanged from March 2021. I have personally reviewed and interpreted this study.   Assessment / Plan: 1. Coronary disease status post redo CABG with aortic valve replacement with a pericardial tissue valve in 2008. Myoview study in May of 2013 was normal. He is now complaining of angina (chest heaviness). Will  add Toprol XL 25 mg daily. Update Ntg and instructed to use this if having heaviness. Will update lab work including CBC,CMET, TSH and lipids. Follow  Up in 3 months.   2. Hyperlipidemia. On lipitor 40 mg daily.   Encourage heart healthy eating.  3. Hypertension. Blood pressure is well controlled. Continue current medication. Restrict salt intake.  4. Status post tissue aortic valve replacement. Stable by exam.   5.  Carotid arterial disease. Mild and asymptomatic.  6. Edema. Improved. Weight is down. Stressed importance of sodium restriction.

## 2020-06-08 ENCOUNTER — Encounter: Payer: Self-pay | Admitting: Cardiology

## 2020-06-08 ENCOUNTER — Telehealth: Payer: Self-pay

## 2020-06-08 ENCOUNTER — Other Ambulatory Visit: Payer: Self-pay

## 2020-06-08 ENCOUNTER — Ambulatory Visit: Payer: PPO | Admitting: Cardiology

## 2020-06-08 VITALS — BP 120/62 | HR 75 | Ht 66.0 in | Wt 178.4 lb

## 2020-06-08 DIAGNOSIS — E7800 Pure hypercholesterolemia, unspecified: Secondary | ICD-10-CM

## 2020-06-08 DIAGNOSIS — I10 Essential (primary) hypertension: Secondary | ICD-10-CM

## 2020-06-08 DIAGNOSIS — I6523 Occlusion and stenosis of bilateral carotid arteries: Secondary | ICD-10-CM | POA: Diagnosis not present

## 2020-06-08 DIAGNOSIS — E875 Hyperkalemia: Secondary | ICD-10-CM

## 2020-06-08 DIAGNOSIS — I25119 Atherosclerotic heart disease of native coronary artery with unspecified angina pectoris: Secondary | ICD-10-CM | POA: Diagnosis not present

## 2020-06-08 DIAGNOSIS — Z953 Presence of xenogenic heart valve: Secondary | ICD-10-CM | POA: Diagnosis not present

## 2020-06-08 LAB — SPECIMEN STATUS REPORT

## 2020-06-08 MED ORDER — AMLODIPINE BESY-BENAZEPRIL HCL 5-20 MG PO CAPS: 1.0000 | ORAL_CAPSULE | Freq: Two times a day (BID) | ORAL | 3 refills | Status: DC

## 2020-06-08 MED ORDER — NITROGLYCERIN 0.4 MG SL SUBL: 0.4000 mg | SUBLINGUAL_TABLET | SUBLINGUAL | 1 refills | Status: DC | PRN

## 2020-06-08 MED ORDER — ATORVASTATIN CALCIUM 40 MG PO TABS: 40.0000 mg | ORAL_TABLET | Freq: Every day | ORAL | 3 refills | Status: DC

## 2020-06-08 MED ORDER — METOPROLOL SUCCINATE ER 25 MG PO TB24: 25.0000 mg | ORAL_TABLET | Freq: Every day | ORAL | 3 refills | Status: DC

## 2020-06-08 NOTE — Patient Instructions (Signed)
Add Toprol XL 25 mg daily  Take Ntg sublingual as needed for chest pressure  We will check lab work today.  Follow up  In 3 months

## 2020-06-08 NOTE — Telephone Encounter (Signed)
Spoke to patient advised potassium done at office today was elevated 5.9.Advised to have repeated in the morning at our [facility] office.Order placed.

## 2020-06-08 NOTE — Progress Notes (Signed)
bme 

## 2020-06-09 ENCOUNTER — Other Ambulatory Visit: Payer: PPO

## 2020-06-09 DIAGNOSIS — E875 Hyperkalemia: Secondary | ICD-10-CM | POA: Diagnosis not present

## 2020-06-09 LAB — HEPATIC FUNCTION PANEL
ALT: 16 IU/L (ref 0–44)
AST: 18 IU/L (ref 0–40)
Albumin: 3.8 g/dL (ref 3.5–4.6)
Alkaline Phosphatase: 93 IU/L (ref 44–121)
Bilirubin Total: 0.4 mg/dL (ref 0.0–1.2)
Bilirubin, Direct: 0.12 mg/dL (ref 0.00–0.40)
Total Protein: 6.3 g/dL (ref 6.0–8.5)

## 2020-06-09 LAB — LIPID PANEL
Chol/HDL Ratio: 3.4 ratio (ref 0.0–5.0)
Cholesterol, Total: 159 mg/dL (ref 100–199)
HDL: 47 mg/dL (ref >39)
LDL Chol Calc (NIH): 90 mg/dL (ref 0–99)
Triglycerides: 123 mg/dL (ref 0–149)
VLDL Cholesterol Cal: 22 mg/dL (ref 5–40)

## 2020-06-09 LAB — CBC WITH DIFFERENTIAL/PLATELET
Basophils Absolute: 0 10*3/uL (ref 0.0–0.2)
Basos: 1 %
EOS (ABSOLUTE): 0.1 10*3/uL (ref 0.0–0.4)
Eos: 1 %
Hematocrit: 34.5 % — ABNORMAL LOW (ref 37.5–51.0)
Hemoglobin: 11.7 g/dL — ABNORMAL LOW (ref 13.0–17.7)
Immature Grans (Abs): 0 10*3/uL (ref 0.0–0.1)
Immature Granulocytes: 0 %
Lymphocytes Absolute: 2 10*3/uL (ref 0.7–3.1)
Lymphs: 25 %
MCH: 28.6 pg (ref 26.6–33.0)
MCHC: 33.9 g/dL (ref 31.5–35.7)
MCV: 84 fL (ref 79–97)
Monocytes Absolute: 0.6 10*3/uL (ref 0.1–0.9)
Monocytes: 8 %
Neutrophils Absolute: 5.3 10*3/uL (ref 1.4–7.0)
Neutrophils: 65 %
Platelets: 214 10*3/uL (ref 150–450)
RBC: 4.09 x10E6/uL — ABNORMAL LOW (ref 4.14–5.80)
RDW: 14.7 % (ref 11.6–15.4)
WBC: 8.1 10*3/uL (ref 3.4–10.8)

## 2020-06-09 LAB — BASIC METABOLIC PANEL
BUN/Creatinine Ratio: 20 (ref 10–24)
BUN: 40 mg/dL — ABNORMAL HIGH (ref 10–36)
CO2: 19 mmol/L — ABNORMAL LOW (ref 20–29)
Calcium: 9 mg/dL (ref 8.6–10.2)
Chloride: 111 mmol/L — ABNORMAL HIGH (ref 96–106)
Creatinine, Ser: 2.01 mg/dL — ABNORMAL HIGH (ref 0.76–1.27)
GFR calc Af Amer: 32 mL/min/{1.73_m2} — ABNORMAL LOW (ref >59)
GFR calc non Af Amer: 28 mL/min/{1.73_m2} — ABNORMAL LOW (ref >59)
Glucose: 110 mg/dL — ABNORMAL HIGH (ref 65–99)
Potassium: 5.9 mmol/L (ref 3.5–5.2)
Sodium: 144 mmol/L (ref 134–144)

## 2020-06-09 LAB — TSH: TSH: 6.23 u[IU]/mL — ABNORMAL HIGH (ref 0.450–4.500)

## 2020-06-10 ENCOUNTER — Other Ambulatory Visit: Payer: Self-pay

## 2020-06-10 DIAGNOSIS — E875 Hyperkalemia: Secondary | ICD-10-CM

## 2020-06-10 LAB — BASIC METABOLIC PANEL
BUN/Creatinine Ratio: 23 (ref 10–24)
BUN: 42 mg/dL — ABNORMAL HIGH (ref 10–36)
CO2: 18 mmol/L — ABNORMAL LOW (ref 20–29)
Calcium: 9.2 mg/dL (ref 8.6–10.2)
Chloride: 109 mmol/L — ABNORMAL HIGH (ref 96–106)
Creatinine, Ser: 1.8 mg/dL — ABNORMAL HIGH (ref 0.76–1.27)
GFR calc Af Amer: 36 mL/min/{1.73_m2} — ABNORMAL LOW (ref >59)
GFR calc non Af Amer: 32 mL/min/{1.73_m2} — ABNORMAL LOW (ref >59)
Glucose: 86 mg/dL (ref 65–99)
Potassium: 5.7 mmol/L — ABNORMAL HIGH (ref 3.5–5.2)
Sodium: 142 mmol/L (ref 134–144)

## 2020-06-10 MED ORDER — AMLODIPINE BESYLATE 5 MG PO TABS: 5.0000 mg | ORAL_TABLET | Freq: Every day | ORAL | 3 refills | Status: DC

## 2020-06-17 ENCOUNTER — Other Ambulatory Visit: Payer: Self-pay

## 2020-06-17 MED ORDER — HYDRALAZINE HCL 25 MG PO TABS: 25.0000 mg | ORAL_TABLET | Freq: Three times a day (TID) | ORAL | 3 refills | Status: DC

## 2020-06-17 NOTE — Telephone Encounter (Signed)
Spoke to patient's son Lesly Rubenstein Dr.Jordan's advice given.Hydralaxine prescription sent to pharmacy.Advised continue to monitor B/P and call back if B/P elevated.

## 2020-06-17 NOTE — Telephone Encounter (Signed)
He should be still on amlodipine 5 mg and Toprol XL 25 mg. We did stop lisinopril due to hyperkalemia. Lets add hydralazine 25 mg tid and follow  Hokulani Rogel [place] MD, [facility]

## 2020-06-17 NOTE — Progress Notes (Signed)
Hydralazine  

## 2020-06-19 NOTE — Telephone Encounter (Signed)
I am ok with ordering this for Pritesh  Daja Shuping [place] MD, [facility]

## 2020-06-24 DIAGNOSIS — E875 Hyperkalemia: Secondary | ICD-10-CM | POA: Diagnosis not present

## 2020-06-24 LAB — BASIC METABOLIC PANEL
BUN/Creatinine Ratio: 17 (ref 10–24)
BUN: 24 mg/dL (ref 10–36)
CO2: 20 mmol/L (ref 20–29)
Calcium: 8.8 mg/dL (ref 8.6–10.2)
Chloride: 108 mmol/L — ABNORMAL HIGH (ref 96–106)
Creatinine, Ser: 1.42 mg/dL — ABNORMAL HIGH (ref 0.76–1.27)
GFR calc Af Amer: 49 mL/min/{1.73_m2} — ABNORMAL LOW (ref >59)
GFR calc non Af Amer: 42 mL/min/{1.73_m2} — ABNORMAL LOW (ref >59)
Glucose: 116 mg/dL — ABNORMAL HIGH (ref 65–99)
Potassium: 4.6 mmol/L (ref 3.5–5.2)
Sodium: 141 mmol/L (ref 134–144)

## 2020-06-29 NOTE — Telephone Encounter (Signed)
Spoke to patient's son Lesly Rubenstein, letter for a mechanical bed left at front desk.

## 2020-06-30 ENCOUNTER — Telehealth: Payer: Self-pay | Admitting: Cardiology

## 2020-06-30 NOTE — Telephone Encounter (Signed)
Patient is in need of a power lift chair to get into bed. Son is requesting a letter be sent from Dr. Swaziland to [employer]. Informed patient Ms. Elnita Maxwell is out of the office but message will be sent to her for follow up. Patient's son requests a follow up call once it is completed.

## 2020-06-30 NOTE — Telephone Encounter (Signed)
Son of patient returned call and wanted to talk to [employer].  Patient needs to have Dr. Swaziland write order for the patient to get a Power Lift Chair for assistance to get into bed. Per the son, the order needs to go directly from Dr. Sw[place]iland to [employer].

## 2020-07-01 NOTE — Telephone Encounter (Signed)
Called patient's son Lesly Rubenstein no answer.Left message to call back.

## 2020-07-02 NOTE — Telephone Encounter (Signed)
Spoke to patient's son Lesly Rubenstein letter for a hospital bed left at front desk.

## 2020-07-03 NOTE — Telephone Encounter (Signed)
Patient's son Lesly Rubenstein sent a email this morning he will pick letters for power lift chair and mechanical bed today.

## 2020-07-24 ENCOUNTER — Other Ambulatory Visit: Payer: Self-pay | Admitting: Cardiology

## 2020-08-28 DIAGNOSIS — X32XXXA Exposure to sunlight, initial encounter: Secondary | ICD-10-CM | POA: Diagnosis not present

## 2020-08-28 DIAGNOSIS — L570 Actinic keratosis: Secondary | ICD-10-CM | POA: Diagnosis not present

## 2020-08-28 DIAGNOSIS — L821 Other seborrheic keratosis: Secondary | ICD-10-CM | POA: Diagnosis not present

## 2020-08-28 DIAGNOSIS — L820 Inflamed seborrheic keratosis: Secondary | ICD-10-CM | POA: Diagnosis not present

## 2020-09-03 NOTE — Progress Notes (Signed)
[name] Date of Birth: Sep 21, 1924 Medical Record #[id number]  History of Present Illness: [name] is seen for follow up of CAD. He is s/p redo CABG and AVR in 2008.  He did have carotid dopplers in August 2017  which showed 40-59% bilateral ICA stenosis. Repeat in 2019 showed < 39% stenosis bilaterally.    Myoview study in May 2013 was normal. Echo in 2012 showed normal LV function and mild aortic stenosis. He also had LE arterial dopplers in August 2016 which showed mild vascular disease with good ABIs.   He is now 87 years old. He is widowed.  Still lives alone. He is seen with his son today  who has POA. He is eating a lot of salty crackers. Swelling has increased.  Reports he has only used sl Ntg twice since our last visit. Breathing is good. Has a lift chair at home.  On his last visit we added Toprol XL 25 mg daily and ordered labs. He has mild chronic anemia and mildly elevated TSH. Potassium was high so lisinopril was stopped and potassium returned to normal. BP went up so hydralazine was added. Son reports BP at home consistently elevated in 150-165 range.   Current Outpatient Medications on File Prior to Visit  Medication Sig Dispense Refill  . amLODipine (NORVASC) 5 MG tablet Take 1 tablet (5 mg total) by mouth daily. 90 tablet 3  . aspirin (ASPIRIN CHILDRENS) 81 MG chewable tablet Chew 1 tablet (81 mg total) by mouth daily. 36 tablet 11  . atorvastatin (LIPITOR) 40 MG tablet Take 1 tablet (40 mg total) by mouth daily. 90 tablet 3  . COMBIGAN 0.2-0.5 % ophthalmic solution Place 1 drop into the left eye every 12 (twelve) hours.     . hydrALAZINE (APRESOLINE) 25 MG tablet Take 1 tablet (25 mg total) by mouth 3 (three) times daily. 270 tablet 3  . metoprolol succinate (TOPROL XL) 25 MG 24 hr tablet Take 1 tablet (25 mg total) by mouth daily. 90 tablet 3  . nitroGLYCERIN (NITROSTAT) 0.4 MG SL tablet Place 1 tablet (0.4 mg total) under the tongue every 5 (five) minutes as needed. 25  tablet 1  . TRAVATAN Z 0.004 % SOLN ophthalmic solution Place 1 drop into the left eye at bedtime.      No current facility-administered medications on file prior to visit.    Allergies  Allergen Reactions  . Codeine     Hallucinations  . Penicillins Hives    Past Medical History:  Diagnosis Date  . Carotid arterial disease (HCC)    Doppler in 2003 with 40 to 60% L ICA  . Carpal tunnel syndrome   . Coronary artery disease    s/p CABG & redo CABG with AVR  . Hyperlipidemia   . Hypertension   . Meningitis   . Normal echocardiogram Sept 2012   Has normal EF, mild AS and moderate LAE  . PAF (paroxysmal atrial fibrillation) [facility]) Sept 2012   converted spontaneously  . PUD (peptic ulcer disease)    Remote history  . Status post aortic valve replacement with tissue    for aortic stenosis    Past Surgical History:  Procedure Laterality Date  . CARDIAC CATHETERIZATION  04/2007   EF 60%/severe two-vessel obstructive coronary artery disease/patent saphenous bein graft to rt coronary/ severe stenosis at the ostium of the lt internal mammary artery graft to the abtuse marginal branch/normal lt ventricular function/ severe aortic stenosis/normal rt heart pressures  . CARDIAC  CATHETERIZATION  07/25/2002   EF 65%/two-vessel obstructive atherosclerotic coronary artery disease/ patent saphenous vein graft to the distal rt coronary arter//patent lt internal mammary artery graft to the obtuse marginal branch/normal lt ventricular function  . CARDIAC CATHETERIZATION  05/17/1991   EF65%/two-vessel ovstructive atherosclerotic coronary artery disease/good lt ventricular performance/compared with previous catheterization there is now total occlusion of the tr coronary artery at the prior angioplasty site/good collateral flow to the distal rt coronary artery is seen/there is also progressive disease in the lt circumflex vessel now to obstructive levels  . CARDIAC CATHETERIZATION  07/26/1990    EF60%/borderline obstructive coronary artery disease in the mid lt circumflex & in the rt coronary artery prior angioplasty site/normal lt ventricular function  . CARDIAC CATHETERIZATION  05/23/1990   successful percutaneous transluminal coronary angioplasty of the proximal rt coronary artery  . CORONARY ANGIOPLASTY  07/12/1990   single vessel obstructive atherosclerotic coronary atrery disease in the rt corornary artery with restenosis of the primary angioplasty site/successful repeat PTCA of the proximal rt coronary arter  . CORONARY ARTERY BYPASS GRAFT    . CORONARY ARTERY BYPASS GRAFT     x2 using a reverse saphenous vein graft to posterior descending, reseverse saphenous vein graft to lt internal mammary artery at the previous bypass to the circumflex with a no vein harvesting  . HEMORRHOID SURGERY     at age 85  . INGUINAL HERNIA REPAIR  1967  . PENILE PROSTHESIS IMPLANT  1989  . STERNOTOMY     redo median with aortic valve replacement with a pericardial tissue valve/[employer] model 3000,65mm,serial numver [id number]  . TONSILLECTOMY     at age 42  . VASECTOMY     at age 6    Social History   Tobacco Use  Smoking Status Former Games developer  . Packs/day: 1.50  . Years: 41.00  . Pack years: 61.50  . Types: Cigarettes, Cigars  . Quit date: 05/02/1982  . Years since quitting: 38.3  Smokeless Tobacco Never Used    Social History   Substance and Sexual Activity  Alcohol Use No    Family History  Problem Relation Age of Onset  . Cancer Father 39       intestinal  . Heart attack Mother 40  . Stroke Sister     Review of Systems: The review of systems is per the HPI.  All other systems were reviewed and are negative.  Physical Exam: BP (!) 142/68   Pulse 74   Ht 5\' 6"  (1.676 m)   Wt 186 lb (84.4 kg)   SpO2 97%   BMI 30.02 kg/m  GENERAL:  Well appearing elderly WM in NAD HEENT:  PERRL, EOMI, sclera are clear. Oropharynx is clear. NECK:  No jugular venous  distention,soft bilateral bruits, no thyromegaly or adenopathy LUNGS:  Clear to auscultation bilaterally CHEST:  Unremarkable HEART:  RRR,  PMI not displaced or sustained,S1 and S2 within normal limits, no S3, no S4: no clicks, no rubs, soft systolic murmur RUSB ABD:  Soft, nontender. BS +, no masses or bruits. No hepatomegaly, no splenomegaly EXT:  2 + pulses throughout,  2+ pretibial edema, no cyanosis no clubbing SKIN:  Warm and dry.  No rashes NEURO:  Alert and oriented x 3. Cranial nerves II through XII intact. PSYCH:  Cognitively intact  LABORATORY DATA: Lab Results  Component Value Date   WBC 8.1 06/08/2020   HGB 11.7 (L) 06/08/2020   HCT 34.5 (L) 06/08/2020   PLT 214 06/08/2020  GLUCOSE 116 (H) 06/24/2020   CHOL 159 06/08/2020   TRIG 123 06/08/2020   HDL 47 06/08/2020   LDLCALC 90 06/08/2020   ALT 16 06/08/2020   AST 18 06/08/2020   NA 141 06/24/2020   K 4.6 06/24/2020   CL 108 (H) 06/24/2020   CREATININE 1.42 (H) 06/24/2020   BUN 24 06/24/2020   CO2 20 06/24/2020   TSH 6.230 (H) 06/08/2020   INR 1.4 06/04/2007   HGBA1C 6.1 (H) 07/02/2019    Assessment / Plan: 1. Coronary disease status post redo CABG with aortic valve replacement with a pericardial tissue valve in 2008. Myoview study in May of 2013 was normal. He has stable class 2 angina improved with addition of  Toprol XL 25 mg daily. Continue current therapy  2. Hyperlipidemia. On lipitor 40 mg daily.   Encourage heart healthy eating.  3. Hypertension. Blood pressure is still not optimal. Needs to do better with  salt restriction. Will add lasix 20 mg daily. If BP doesn't improve will consider increasing hydralazine.  4. Status post tissue aortic valve replacement. Stable by exam.   5.  Carotid arterial disease. Mild and asymptomatic.  6. Edema. Increased. Weight is increased.  Stressed importance of sodium restriction. Add lasix 20 mg daily.

## 2020-09-07 ENCOUNTER — Other Ambulatory Visit: Payer: Self-pay

## 2020-09-07 ENCOUNTER — Encounter: Payer: Self-pay | Admitting: Cardiology

## 2020-09-07 ENCOUNTER — Ambulatory Visit: Payer: PPO | Admitting: Cardiology

## 2020-09-07 VITALS — BP 142/68 | HR 74 | Ht 66.0 in | Wt 186.0 lb

## 2020-09-07 DIAGNOSIS — Z953 Presence of xenogenic heart valve: Secondary | ICD-10-CM

## 2020-09-07 DIAGNOSIS — I25119 Atherosclerotic heart disease of native coronary artery with unspecified angina pectoris: Secondary | ICD-10-CM

## 2020-09-07 DIAGNOSIS — I10 Essential (primary) hypertension: Secondary | ICD-10-CM

## 2020-09-07 DIAGNOSIS — E7800 Pure hypercholesterolemia, unspecified: Secondary | ICD-10-CM

## 2020-09-07 MED ORDER — FUROSEMIDE 20 MG PO TABS: 20.0000 mg | ORAL_TABLET | Freq: Every day | ORAL | 3 refills | Status: DC

## 2020-09-07 NOTE — Patient Instructions (Signed)
Add lasix 20 mg daily  Restrict your salt intake.  Follow up in

## 2020-09-16 DIAGNOSIS — H401111 Primary open-angle glaucoma, right eye, mild stage: Secondary | ICD-10-CM | POA: Diagnosis not present

## 2020-09-16 DIAGNOSIS — H401421 Capsular glaucoma with pseudoexfoliation of lens, left eye, mild stage: Secondary | ICD-10-CM | POA: Diagnosis not present

## 2020-10-21 ENCOUNTER — Ambulatory Visit: Payer: PPO | Admitting: Family Medicine

## 2020-11-23 ENCOUNTER — Telehealth: Payer: Self-pay | Admitting: Cardiology

## 2020-11-23 NOTE — Telephone Encounter (Signed)
Pt is experiencing dizziness and has "red face" when this it is happening. Denies any other sx, no CP or pressure, SOB, etc...he has appt 12-21-20 with Dr Swaziland. There are no earlier appts. Suggested maybe for pt to her [place] device. He will check into it. He will go to ER when this happens again. Verbalizes understanding. Will forward for review

## 2020-11-23 NOTE — Telephone Encounter (Signed)
Patient's son called in wanting to speak with the nurse in regards to what's been going on with his dad. Please advise

## 2020-11-25 NOTE — Telephone Encounter (Signed)
Spoke to patient's son Lesly Rubenstein Dr.Jordan advised to check B/P and pulse when he has a dizzy spell and keep a log.Stated his father lives alone and that might be hard to do, but he will try.Advised if PCP wants him seen sooner call back.

## 2020-11-25 NOTE — Telephone Encounter (Signed)
Spoke to patient's son Lesly Rubenstein.He stated for the past 1 month father has been having dizzy spells that last appox 5 mins.Stated no fast heart beat.No chest pain.He only notices his face turns red.He has not passed out.He only wants to see Dr.Jordan.Appointment already scheduled with Dr.Jordan 8/22.Stated he has appointment with a new PCP on Monday 8/1.Advised I will make Dr.Jordan aware.

## 2020-11-25 NOTE — Telephone Encounter (Signed)
    Pt's son said he is returning Cheryl's call regarding pt's symptoms

## 2020-11-27 ENCOUNTER — Ambulatory Visit: Payer: PPO | Admitting: Family Medicine

## 2020-11-27 ENCOUNTER — Other Ambulatory Visit: Payer: Self-pay

## 2020-11-30 ENCOUNTER — Other Ambulatory Visit: Payer: Self-pay

## 2020-11-30 ENCOUNTER — Encounter: Payer: Self-pay | Admitting: Family Medicine

## 2020-11-30 ENCOUNTER — Ambulatory Visit (INDEPENDENT_AMBULATORY_CARE_PROVIDER_SITE_OTHER): Payer: PPO | Admitting: Family Medicine

## 2020-11-30 VITALS — BP 142/76 | HR 76 | Temp 96.8°F | Ht 65.0 in | Wt 184.0 lb

## 2020-11-30 DIAGNOSIS — E7800 Pure hypercholesterolemia, unspecified: Secondary | ICD-10-CM | POA: Diagnosis not present

## 2020-11-30 DIAGNOSIS — I10 Essential (primary) hypertension: Secondary | ICD-10-CM

## 2020-11-30 DIAGNOSIS — Z953 Presence of xenogenic heart valve: Secondary | ICD-10-CM

## 2020-11-30 DIAGNOSIS — I25119 Atherosclerotic heart disease of native coronary artery with unspecified angina pectoris: Secondary | ICD-10-CM | POA: Diagnosis not present

## 2020-11-30 DIAGNOSIS — H409 Unspecified glaucoma: Secondary | ICD-10-CM | POA: Insufficient documentation

## 2020-11-30 NOTE — Telephone Encounter (Signed)
Called pt's son [name] (ok per DPR). Pt's son reports they have gotten a Kardia monitor to be able to capture an EKG when the pt has a "dizzy spell", as instructed by Dr. Swaziland. The pt's son was wondering how to get these EKG readings to the office, and was requesting Dr. Elvis Coil email address. Nurse told pt's son [name] EKGs can be sent via MyChart, either as a pdf file or as a picture. Told patient there was no email address for the office that would ensure pt's EKG was reviewed, explained process of MyChart messages being checked through nurse triage. Pt also given the option to physically bring EKGs into office for review. Pt's son verbalized understanding. Pt's son reports the pt is feeling well at this time. Pt's son instructed to call 911 or bring pt to the emergency room if he develops chest pain, shortness of breath, syncope, or any other concerning symptoms. Pt's son verbalized understanding. This RN also to send message to Dr. Swaziland and his primary nurse for review and any further advice.

## 2020-11-30 NOTE — Telephone Encounter (Signed)
Pt's son is wanting a call back about pt's mobile EKG. Pt's son can be reached at [phone]

## 2020-11-30 NOTE — Progress Notes (Signed)
[facility] [address] [place] [address] Dept: [phone] Dept Fax: [phone]  New Patient Office Visit  Subjective:    Patient ID: [name], male    DOB: 02-26-1925, 87 y.o..   MRN: [id number]  Chief Complaint  Patient presents with   Establish Care    NP- establish care.  C/o having intermediate dizzy spells off/on (last one this weekend).  Also having low back/hip pain on/off years. Taken Ibuprofen.     History of Present Illness:  Patient is in today to establish care. [name] is from [place], [place]. He was married to his wife for over 70 years. She died in 09/05/2015. He has three children, 2 boys and a girl. He [place], but worked as an adult as a Curator. 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 He quit using tobacco in 09/05/1982. He denies any alcohol use.  [name] has a history of CAD and has had several prior open heart surgeries, including CABG and aortic valve replacement (tissue valve). He remains active at this point. He has had some hypertension and is managed on amlodipine and hydralazine. He has a history of hyperlipidemia and is on Lipitor. He is also managed on Lasix and metoprolol, related to his heart disease. He keeps NTG on hand.  [name] has a history of glaucoma in the left eye and uses Travatan and Combigan drops.  [name] has had an occasional brief episode of dizziness or lightheadedness in the past few months. The cardiologist had recommended the family help him obtain a Kardiamobile unit to allow him to take an ECG tracing when he is having such an event. His son is helping him get this set up with his cell phone.  Past Medical History: Patient Active Problem List   Diagnosis Date Noted   Glaucoma 11/30/2020   Carotid artery disease (HCC) 11/12/2013    Claudication (HCC) 12/12/2012   Peripheral vascular disease (HCC) 12/12/2012   Carotid bruit 08/23/2011   Atrial fibrillation with RVR (HCC) 01/12/2011   Essential hypertension    Hyperlipidemia    Coronary artery disease    Status post aortic valve replacement with tissue    Past Surgical History:  Procedure Laterality Date   APPENDECTOMY     CARDIAC CATHETERIZATION  04/02/2007   EF 60%/severe two-vessel obstructive coronary artery disease/patent saphenous bein graft to rt coronary/ severe stenosis at the ostium of the lt internal mammary artery graft to the abtuse marginal branch/normal lt ventricular function/ severe aortic stenosis/normal rt heart pressures   CARDIAC CATHETERIZATION  07/25/2002   EF 65%/two-vessel obstructive atherosclerotic coronary artery disease/ patent saphenous vein graft to the distal rt coronary arter//patent lt internal mammary artery graft to the obtuse marginal branch/normal lt ventricular function   CARDIAC CATHETERIZATION  05/17/1991   EF65%/two-vessel ovstructive atherosclerotic coronary artery disease/good lt ventricular performance/compared with previous catheterization there is now total occlusion of the tr coronary artery at the prior angioplasty site/good collateral flow to the distal rt coronary artery is seen/there is also progressive disease in the lt circumflex vessel now to obstructive levels   CARDIAC CATHETERIZATION  07/26/1990   EF60%/borderline obstructive coronary artery disease in the mid lt circumflex & in the rt coronary artery prior angioplasty site/normal lt ventricular function   CARDIAC CATHETERIZATION  05/23/1990   successful percutaneous  transluminal coronary angioplasty of the proximal rt coronary artery   CATARACT EXTRACTION W/ INTRAOCULAR LENS IMPLANT Bilateral    CORONARY ANGIOPLASTY  07/12/1990   single vessel obstructive atherosclerotic coronary atrery disease in the rt corornary artery with restenosis of the primary angioplasty  site/successful repeat PTCA of the proximal rt coronary arter   CORONARY ARTERY BYPASS GRAFT     CORONARY ARTERY BYPASS GRAFT     x2 using a reverse saphenous vein graft to posterior descending, reseverse saphenous vein graft to lt internal mammary artery at the previous bypass to the circumflex with a no vein harvesting   GANGLION CYST EXCISION Right    Wrist   HEMORRHOID SURGERY     at age 28   INGUINAL HERNIA REPAIR  05/02/1965   PENILE PROSTHESIS IMPLANT  05/03/1987   STERNOTOMY     redo median with aortic valve replacement with a pericardial tissue valve/[employer] model 3000,13mm,serial numver [id number]   TONSILLECTOMY     at age 13   VASECTOMY     at age 77   Family History  Problem Relation Age of Onset   Cancer Father 31       intestinal   Heart attack Mother 39   Stroke Sister    Outpatient Medications Prior to Visit  Medication Sig Dispense Refill   amLODipine (NORVASC) 5 MG tablet Take 1 tablet (5 mg total) by mouth daily. 90 tablet 3   aspirin (ASPIRIN CHILDRENS) 81 MG chewable tablet Chew 1 tablet (81 mg total) by mouth daily. 36 tablet 11   atorvastatin (LIPITOR) 40 MG tablet Take 1 tablet (40 mg total) by mouth daily. 90 tablet 3   COMBIGAN 0.2-0.5 % ophthalmic solution Place 1 drop into the left eye every 12 (twelve) hours.      furosemide (LASIX) 20 MG tablet Take 1 tablet (20 mg total) by mouth daily. 90 tablet 3   hydrALAZINE (APRESOLINE) 25 MG tablet Take 1 tablet (25 mg total) by mouth 3 (three) times daily. 270 tablet 3   metoprolol succinate (TOPROL XL) 25 MG 24 hr tablet Take 1 tablet (25 mg total) by mouth daily. 90 tablet 3   nitroGLYCERIN (NITROSTAT) 0.4 MG SL tablet Place 1 tablet (0.4 mg total) under the tongue every 5 (five) minutes as needed. 25 tablet 1   TRAVATAN Z 0.004 % SOLN ophthalmic solution Place 1 drop into the left eye at bedtime.      No facility-administered medications prior to visit.   Allergies  Allergen Reactions    Codeine     Hallucinations   Penicillins Hives   Objective:   Today's Vitals   11/30/20 1411  BP: (!) 142/76  Pulse: 76  Temp: (!) 96.8 F (36 C)  TempSrc: Temporal  SpO2: 96%  Weight: 184 lb (83.5 kg)  Height: 5\' 5"  (1.651 m)   Body mass index is 30.62 kg/m.   General: Well developed, well nourished. No acute distress. CV: RRR without murmurs or rubs. Pulses 2+ bilaterally. Extremities: 1-2+ puffy edema above sock line. Psych: Alert and oriented. Normal mood and affect.  Health Maintenance Due  Topic Date Due   TETANUS/TDAP  Never done   Zoster Vaccines- Shingrix (1 of 2) Never done   PNA vac Low Risk Adult (1 of 2 - PCV13) Never done   COVID-19 Vaccine (3 - Pfizer risk series) 07/11/2019   INFLUENZA VACCINE  11/30/2020     Lab results: Lab Results  Component Value Date   CHOL  159 06/08/2020   HDL 47 06/08/2020   LDLCALC 90 06/08/2020   TRIG 123 06/08/2020   CHOLHDL 3.4 06/08/2020   Lab Results  Component Value Date   WBC 8.1 06/08/2020   HGB 11.7 (L) 06/08/2020   HCT 34.5 (L) 06/08/2020   MCV 84 06/08/2020   PLT 214 06/08/2020   Lab Results  Component Value Date   HGBA1C 6.1 (H) 07/02/2019   Assessment & Plan:   1. Essential hypertension Blood pressure is acceptable today. It is not clear if his episodes might be mild orthostasis vs. some underlying arrhythmia. I agree with the cardiology plan for assessing using the mobile device.  2. Coronary artery disease involving native coronary artery of native heart with angina pectoris (HCC) Stable on current meds.  3. Pure hypercholesterolemia Lipids at goal in Feb. We will continue atoravastatin.  4. Status post aortic valve replacement with tissue No murmur noted today. Continue to follow with cardiology.  Loyola Mast, MD

## 2020-12-02 NOTE — Telephone Encounter (Signed)
Glad to review tracings if he can send them to me.  Devereaux Grayson Swaziland MD, [facility]

## 2020-12-14 DIAGNOSIS — Z953 Presence of xenogenic heart valve: Secondary | ICD-10-CM | POA: Diagnosis not present

## 2020-12-14 DIAGNOSIS — I25119 Atherosclerotic heart disease of native coronary artery with unspecified angina pectoris: Secondary | ICD-10-CM | POA: Diagnosis not present

## 2020-12-14 DIAGNOSIS — E7800 Pure hypercholesterolemia, unspecified: Secondary | ICD-10-CM | POA: Diagnosis not present

## 2020-12-14 DIAGNOSIS — I10 Essential (primary) hypertension: Secondary | ICD-10-CM | POA: Diagnosis not present

## 2020-12-14 LAB — BASIC METABOLIC PANEL
BUN/Creatinine Ratio: 18 (ref 10–24)
BUN: 28 mg/dL (ref 10–36)
CO2: 23 mmol/L (ref 20–29)
Calcium: 9.2 mg/dL (ref 8.6–10.2)
Chloride: 105 mmol/L (ref 96–106)
Creatinine, Ser: 1.53 mg/dL — ABNORMAL HIGH (ref 0.76–1.27)
Glucose: 148 mg/dL — ABNORMAL HIGH (ref 65–99)
Potassium: 5.1 mmol/L (ref 3.5–5.2)
Sodium: 143 mmol/L (ref 134–144)
eGFR: 42 mL/min/{1.73_m2} — ABNORMAL LOW (ref >59)

## 2020-12-18 NOTE — Progress Notes (Signed)
[name] Date of Birth: 1924/11/01 Medical Record #[id number]  History of Present Illness: [name] is seen for follow up of CAD. He is s/p redo CABG and AVR in 2008.  He did have carotid dopplers in August 2017  which showed 40-59% bilateral ICA stenosis. Repeat in 2019 showed < 39% stenosis bilaterally.    Myoview study in May 2013 was normal. Echo in 2012 showed normal LV function and mild aortic stenosis. He also had LE arterial dopplers in August 2016 which showed mild vascular disease with good ABIs.   He is now 87 years old. He is widowed.  Still lives alone.  He recently has experienced symptoms of transient dizziness. Feels like head rolling around.  He now has a Cardiomobile device but he hasn't used it yet since he hasn't had any spells in 2 weeks. Notes BP 130-160 systolic at home.  He is seen with his son today  who has POA. He does complain of pain in his right thigh. Mows 6 acres on a riding mower.    Current Outpatient Medications on File Prior to Visit  Medication Sig Dispense Refill   aspirin (ASPIRIN CHILDRENS) 81 MG chewable tablet Chew 1 tablet (81 mg total) by mouth daily. 36 tablet 11   atorvastatin (LIPITOR) 40 MG tablet Take 1 tablet (40 mg total) by mouth daily. 90 tablet 3   COMBIGAN 0.2-0.5 % ophthalmic solution Place 1 drop into the left eye every 12 (twelve) hours.      furosemide (LASIX) 20 MG tablet Take 1 tablet (20 mg total) by mouth daily. 90 tablet 3   metoprolol succinate (TOPROL XL) 25 MG 24 hr tablet Take 1 tablet (25 mg total) by mouth daily. 90 tablet 3   nitroGLYCERIN (NITROSTAT) 0.4 MG SL tablet Place 1 tablet (0.4 mg total) under the tongue every 5 (five) minutes as needed. 25 tablet 1   TRAVATAN Z 0.004 % SOLN ophthalmic solution Place 1 drop into the left eye at bedtime.      amLODipine (NORVASC) 5 MG tablet Take 1 tablet (5 mg total) by mouth daily. 90 tablet 3   hydrALAZINE (APRESOLINE) 25 MG tablet Take 1 tablet (25 mg total) by mouth 3  (three) times daily. 270 tablet 3   No current facility-administered medications on file prior to visit.    Allergies  Allergen Reactions   Codeine     Hallucinations   Penicillins Hives    Past Medical History:  Diagnosis Date   Carotid arterial disease (HCC)    Doppler in 2003 with 40 to 60% L ICA   Carpal tunnel syndrome    Coronary artery disease    s/p CABG & redo CABG with AVR   Hyperlipidemia    Hypertension    Meningitis    Normal echocardiogram Sept 2012   Has normal EF, mild AS and moderate LAE   PAF (paroxysmal atrial fibrillation) [facility]) Sept 2012   converted spontaneously   PUD (peptic ulcer disease)    Remote history   Status post aortic valve replacement with tissue    for aortic stenosis    Past Surgical History:  Procedure Laterality Date   APPENDECTOMY     CARDIAC CATHETERIZATION  04/02/2007   EF 60%/severe two-vessel obstructive coronary artery disease/patent saphenous bein graft to rt coronary/ severe stenosis at the ostium of the lt internal mammary artery graft to the abtuse marginal branch/normal lt ventricular function/ severe aortic stenosis/normal rt heart pressures   CARDIAC CATHETERIZATION  07/25/2002   EF 65%/two-vessel obstructive atherosclerotic coronary artery disease/ patent saphenous vein graft to the distal rt coronary arter//patent lt internal mammary artery graft to the obtuse marginal branch/normal lt ventricular function   CARDIAC CATHETERIZATION  05/17/1991   EF65%/two-vessel ovstructive atherosclerotic coronary artery disease/good lt ventricular performance/compared with previous catheterization there is now total occlusion of the tr coronary artery at the prior angioplasty site/good collateral flow to the distal rt coronary artery is seen/there is also progressive disease in the lt circumflex vessel now to obstructive levels   CARDIAC CATHETERIZATION  07/26/1990   EF60%/borderline obstructive coronary artery disease in the mid lt  circumflex & in the rt coronary artery prior angioplasty site/normal lt ventricular function   CARDIAC CATHETERIZATION  05/23/1990   successful percutaneous transluminal coronary angioplasty of the proximal rt coronary artery   CATARACT EXTRACTION W/ INTRAOCULAR LENS IMPLANT Bilateral    CORONARY ANGIOPLASTY  07/12/1990   single vessel obstructive atherosclerotic coronary atrery disease in the rt corornary artery with restenosis of the primary angioplasty site/successful repeat PTCA of the proximal rt coronary arter   CORONARY ARTERY BYPASS GRAFT     CORONARY ARTERY BYPASS GRAFT     x2 using a reverse saphenous vein graft to posterior descending, reseverse saphenous vein graft to lt internal mammary artery at the previous bypass to the circumflex with a no vein harvesting   GANGLION CYST EXCISION Right    Wrist   HEMORRHOID SURGERY     at age 39   INGUINAL HERNIA REPAIR  05/02/1965   PENILE PROSTHESIS IMPLANT  05/03/1987   STERNOTOMY     redo median with aortic valve replacement with a pericardial tissue valve/[employer] model 3000,10mm,serial numver [id number]   TONSILLECTOMY     at age 67   VASECTOMY     at age 67    Social History   Tobacco Use  Smoking Status Former   Packs/day: 1.50   Years: 41.00   Pack years: 61.50   Types: Cigarettes, Cigars   Quit date: 05/02/1982   Years since quitting: 38.6  Smokeless Tobacco Never    Social History   Substance and Sexual Activity  Alcohol Use No    Family History  Problem Relation Age of Onset   Cancer Father 54       intestinal   Heart attack Mother 67   Stroke Sister     Review of Systems: The review of systems is per the HPI.  All other systems were reviewed and are negative.  Physical Exam: BP 130/62 (BP Location: Right Arm)   Pulse 66   Ht 5\' 5"  (1.651 m)   Wt 184 lb 9.6 oz (83.7 kg)   SpO2 95%   BMI 30.72 kg/m  GENERAL:  Well appearing elderly WM in NAD HEENT:  PERRL, EOMI, sclera are clear.  Oropharynx is clear. NECK:  No jugular venous distention,soft bilateral bruits, no thyromegaly or adenopathy LUNGS:  Clear to auscultation bilaterally CHEST:  Unremarkable HEART:  RRR,  PMI not displaced or sustained,S1 and S2 within normal limits, no S3, no S4: no clicks, no rubs, soft systolic murmur RUSB ABD:  Soft, nontender. BS +, no masses or bruits. No hepatomegaly, no splenomegaly EXT:  2 + pulses throughout,  2+ pretibial edema, no cyanosis no clubbing SKIN:  Warm and dry.  No rashes NEURO:  Alert and oriented x 3. Cranial nerves II through XII intact. PSYCH:  Cognitively intact  LABORATORY DATA: Lab Results  Component Value Date  WBC 8.1 06/08/2020   HGB 11.7 (L) 06/08/2020   HCT 34.5 (L) 06/08/2020   PLT 214 06/08/2020   GLUCOSE 148 (H) 12/14/2020   CHOL 159 06/08/2020   TRIG 123 06/08/2020   HDL 47 06/08/2020   LDLCALC 90 06/08/2020   ALT 16 06/08/2020   AST 18 06/08/2020   NA 143 12/14/2020   K 5.1 12/14/2020   CL 105 12/14/2020   CREATININE 1.53 (H) 12/14/2020   BUN 28 12/14/2020   CO2 23 12/14/2020   TSH 6.230 (H) 06/08/2020   INR 1.4 06/04/2007   HGBA1C 6.1 (H) 07/02/2019   Ecg today shows NSR rate 66. Normal. I have personally reviewed and interpreted this study.   Assessment / Plan: 1. Coronary disease status post redo CABG with aortic valve replacement with a pericardial tissue valve in 2008. Myoview study in May of 2013 was normal. He has stable class 2 angina improved with addition of  Toprol XL 25 mg daily. Continue current therapy  2. Hyperlipidemia. On lipitor 40 mg daily.   Encourage heart healthy eating.  3. Hypertension. Blood pressure control has improved. Continue  salt restriction.   4. Status post tissue aortic valve replacement. Stable by exam.   5.  Carotid arterial disease. Mild and asymptomatic.  6. Edema. Improved with addition of lasix.  7. Lightheadedness. Requested they check BP and heart rhythm when he has his spells and we  can correlate.

## 2020-12-21 ENCOUNTER — Ambulatory Visit: Payer: PPO | Admitting: Cardiology

## 2020-12-21 ENCOUNTER — Other Ambulatory Visit: Payer: Self-pay

## 2020-12-21 ENCOUNTER — Encounter: Payer: Self-pay | Admitting: Cardiology

## 2020-12-21 VITALS — BP 130/62 | HR 66 | Ht 65.0 in | Wt 184.6 lb

## 2020-12-21 DIAGNOSIS — I25119 Atherosclerotic heart disease of native coronary artery with unspecified angina pectoris: Secondary | ICD-10-CM | POA: Diagnosis not present

## 2020-12-21 DIAGNOSIS — I6523 Occlusion and stenosis of bilateral carotid arteries: Secondary | ICD-10-CM | POA: Diagnosis not present

## 2020-12-21 DIAGNOSIS — I10 Essential (primary) hypertension: Secondary | ICD-10-CM | POA: Diagnosis not present

## 2020-12-21 DIAGNOSIS — E7800 Pure hypercholesterolemia, unspecified: Secondary | ICD-10-CM

## 2020-12-21 DIAGNOSIS — R42 Dizziness and giddiness: Secondary | ICD-10-CM

## 2020-12-21 DIAGNOSIS — Z953 Presence of xenogenic heart valve: Secondary | ICD-10-CM

## 2020-12-24 ENCOUNTER — Telehealth: Payer: Self-pay | Admitting: Cardiology

## 2020-12-24 NOTE — Telephone Encounter (Signed)
Pt was in the office on 12/21/20 about his leg pain, pt would like a pain med to be prescribed for the pain in his right leg. Please advise pt further Pt said the pain med was discussed during his last visit

## 2020-12-24 NOTE — Telephone Encounter (Signed)
Returned call to Pt's son.  Per son-he states Dr. [place] said Pt had sciatica at his last office visit.  Per son-since that time pain has gotten worse and son would like Dr. [place] to prescribe something for pain so Pt can rest.  Advised would ask Dr. [place], but advised to call PCP to discuss need for pain medicine.  Per Dr. Liborio Nixon good candidate for NSAID's.  Would use Tylenol for pain.  Advised son.    No further action needed at this time.

## 2020-12-27 ENCOUNTER — Encounter: Payer: Self-pay | Admitting: Family Medicine

## 2020-12-28 ENCOUNTER — Encounter: Payer: Self-pay | Admitting: Family Medicine

## 2020-12-28 ENCOUNTER — Other Ambulatory Visit: Payer: Self-pay

## 2020-12-28 ENCOUNTER — Ambulatory Visit (INDEPENDENT_AMBULATORY_CARE_PROVIDER_SITE_OTHER): Payer: PPO | Admitting: Family Medicine

## 2020-12-28 ENCOUNTER — Ambulatory Visit (INDEPENDENT_AMBULATORY_CARE_PROVIDER_SITE_OTHER): Payer: PPO

## 2020-12-28 VITALS — BP 146/68 | HR 64 | Temp 97.8°F | Ht 65.0 in | Wt 187.6 lb

## 2020-12-28 DIAGNOSIS — M5441 Lumbago with sciatica, right side: Secondary | ICD-10-CM | POA: Diagnosis not present

## 2020-12-28 DIAGNOSIS — M545 Low back pain, unspecified: Secondary | ICD-10-CM | POA: Diagnosis not present

## 2020-12-28 DIAGNOSIS — S32050A Wedge compression fracture of fifth lumbar vertebra, initial encounter for closed fracture: Secondary | ICD-10-CM

## 2020-12-28 MED ORDER — TRAMADOL HCL 50 MG PO TABS
25.0000 mg | ORAL_TABLET | Freq: Three times a day (TID) | ORAL | 0 refills | Status: AC | PRN
Start: 2020-12-28 — End: 2021-01-02

## 2020-12-28 NOTE — Patient Instructions (Signed)
Use heat to lower back Avoid activities that jar the back

## 2020-12-28 NOTE — Progress Notes (Signed)
[facility] [address] [place] [address] Dept: [phone] Dept Fax: [phone]  Office Visit  Subjective:    Patient ID: [name], male    DOB: 03/27/1925, 87 y.o..   MRN: [id number]  Chief Complaint  Patient presents with   Follow-up    Pt c/o pain in both legs in thigh area x2-3 weeks. Taking Ibuprofen for relief.    History of Present Illness:  Patient is in today for evaluation of lower back pain and leg pain. [name] notes he has been having pain in his lower back for the past 2 weeks. This is associated with bilateral upper leg pain, R>L. He indicates pain over the lateral right thigh. 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 He ended up int he hospital in traction for 2 weeks. He had persistent numbness in the left leg for quite some time afterwards. He denies any numbness int he legs at present. He has chronic lower leg edema, but has not seen tis worsen more recently. 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 his son, notes this has no springs on it, so there is quite a bit of jarring for his father. He denies any recent weight loss, fever, or urinary/bowel incontinence.  Past Medical History: Patient Active Problem List   Diagnosis Date Noted   Glaucoma 11/30/2020   Carotid artery disease (HCC) 11/12/2013   Claudication (HCC) 12/12/2012   Peripheral vascular disease (HCC) 12/12/2012   Carotid bruit 08/23/2011   Atrial fibrillation with RVR (HCC) 01/12/2011   Essential hypertension    Hyperlipidemia    Coronary artery disease    Status post aortic valve replacement with tissue    Past Surgical History:  Procedure Laterality Date   APPENDECTOMY     CARDIAC CATHETERIZATION  04/02/2007   EF 60%/severe two-vessel obstructive coronary artery disease/patent saphenous  bein graft to rt coronary/ severe stenosis at the ostium of the lt internal mammary artery graft to the abtuse marginal branch/normal lt ventricular function/ severe aortic stenosis/normal rt heart pressures   CARDIAC CATHETERIZATION  07/25/2002   EF 65%/two-vessel obstructive atherosclerotic coronary artery disease/ patent saphenous vein graft to the distal rt coronary arter//patent lt internal mammary artery graft to the obtuse marginal branch/normal lt ventricular function   CARDIAC CATHETERIZATION  05/17/1991   EF65%/two-vessel ovstructive atherosclerotic coronary artery disease/good lt ventricular performance/compared with previous catheterization there is now total occlusion of the tr coronary artery at the prior angioplasty site/good collateral flow to the distal rt coronary artery is seen/there is also progressive disease in the lt circumflex vessel now to obstructive levels   CARDIAC CATHETERIZATION  07/26/1990   EF60%/borderline obstructive coronary artery disease in the mid lt circumflex & in the rt coronary artery prior angioplasty site/normal lt ventricular function   CARDIAC CATHETERIZATION  05/23/1990   successful percutaneous transluminal coronary angioplasty of the proximal rt coronary artery   CATARACT EXTRACTION W/ INTRAOCULAR LENS IMPLANT Bilateral    CORONARY ANGIOPLASTY  07/12/1990   single vessel obstructive atherosclerotic coronary atrery disease in the rt corornary artery with restenosis of the primary angioplasty site/successful repeat PTCA of the proximal rt coronary arter   CORONARY ARTERY BYPASS GRAFT     CORONARY ARTERY BYPASS GRAFT     x2 using a reverse saphenous vein graft to  posterior descending, reseverse saphenous vein graft to lt internal mammary artery at the previous bypass to the circumflex with a no vein harvesting   GANGLION CYST EXCISION Right    Wrist   HEMORRHOID SURGERY     at age 73   INGUINAL HERNIA REPAIR  05/02/1965   PENILE PROSTHESIS IMPLANT   05/03/1987   STERNOTOMY     redo median with aortic valve replacement with a pericardial tissue valve/[employer] model 3000,43mm,serial numver [id number]   TONSILLECTOMY     at age 4   VASECTOMY     at age 85   Family History  Problem Relation Age of Onset   Cancer Father 51       intestinal   Heart attack Mother 26   Stroke Sister    Outpatient Medications Prior to Visit  Medication Sig Dispense Refill   amLODipine (NORVASC) 5 MG tablet Take 1 tablet (5 mg total) by mouth daily. 90 tablet 3   aspirin (ASPIRIN CHILDRENS) 81 MG chewable tablet Chew 1 tablet (81 mg total) by mouth daily. 36 tablet 11   atorvastatin (LIPITOR) 40 MG tablet Take 1 tablet (40 mg total) by mouth daily. 90 tablet 3   COMBIGAN 0.2-0.5 % ophthalmic solution Place 1 drop into the left eye every 12 (twelve) hours.      furosemide (LASIX) 20 MG tablet Take 1 tablet (20 mg total) by mouth daily. 90 tablet 3   hydrALAZINE (APRESOLINE) 25 MG tablet Take 1 tablet (25 mg total) by mouth 3 (three) times daily. 270 tablet 3   metoprolol succinate (TOPROL XL) 25 MG 24 hr tablet Take 1 tablet (25 mg total) by mouth daily. 90 tablet 3   nitroGLYCERIN (NITROSTAT) 0.4 MG SL tablet Place 1 tablet (0.4 mg total) under the tongue every 5 (five) minutes as needed. 25 tablet 1   TRAVATAN Z 0.004 % SOLN ophthalmic solution Place 1 drop into the left eye at bedtime.      No facility-administered medications prior to visit.   Allergies  Allergen Reactions   Codeine     Hallucinations   Penicillins Hives     Objective:   Today's Vitals   12/28/20 1100  BP: (!) 146/68  Pulse: 64  Temp: 97.8 F (36.6 C)  TempSrc: Temporal  SpO2: 96%  Weight: 187 lb 9.6 oz (85.1 kg)  Height: 5\' 5"  (1.651 m)   Body mass index is 31.22 kg/m.   General: Well developed, well nourished. No acute distress. Back: Straight. Mild to moderate pain on palpation over the lower lumbar spine and paraspinal muscle   columns. Extremities:  Full ROM. 2+ edema bilaterally. Neuro: LE strength 5/5. DTR- patellar 2+, Achilles 1+. Sensation normal. Psych: Alert and oriented. Normal mood and affect.  Health Maintenance Due  Topic Date Due   TETANUS/TDAP  Never done   Zoster Vaccines- Shingrix (1 of 2) Never done   PNA vac Low Risk Adult (1 of 2 - PCV13) Never done   COVID-19 Vaccine (3 - Booster for Pfizer series) 11/10/2019   INFLUENZA VACCINE  11/30/2020   Imaging: Lumbar x-ray: There is spondylolisthesis of the L3-L4 vertebrae. The L5 vertebrae appears to have a wedge compression.    Assessment & Plan:   1. Acute bilateral low back pain with right-sided sciatica 2. Closed wedge compression fracture of L5 vertebra, initial encounter [facility]) [facility] has tenderness over the lower lumbar spine. The x-ray appears to show a mild compression fracture of indeterminate age. It  is possible that he developed a fracture from jarring while riding his lawn mower. I recommended we consider a CT scan to further evaluate. [name] prefers more conservative management, with a back brace, ibuprofen, and possibly something stronger for pain. I will provide some tramadol. I recommend we reassess him in 2 weeks.  - DG Lumbar Spine Complete - For home use only DME Other see comment - traMADol (ULTRAM) 50 MG tablet; Take 0.5 tablets (25 mg total) by mouth every 8 (eight) hours as needed for up to 5 days.  Dispense: 15 tablet; Refill: 0   Loyola Mast, MD

## 2021-01-05 ENCOUNTER — Encounter: Payer: Self-pay | Admitting: Family Medicine

## 2021-01-06 NOTE — Telephone Encounter (Signed)
Spoke to patient's son, Lesly Rubenstein, and he states that Christo has been having bilateral feet swelling x 24 hours.  He has been taking Tramadol that was advised to only take for 5 days.  They will stop taking Tramadol and see if that helps. If not they will call back to see if they can get an appointment to be evaluated. Dm/cma

## 2021-01-06 NOTE — Telephone Encounter (Signed)
Lft VM to rtn call. Dm/cma  

## 2021-01-20 ENCOUNTER — Telehealth: Payer: Self-pay | Admitting: Family Medicine

## 2021-01-20 NOTE — Telephone Encounter (Signed)
[facility]  Attempted to schedule AWV. Unable to LVM.  Will try at later time.

## 2021-03-03 DIAGNOSIS — Z961 Presence of intraocular lens: Secondary | ICD-10-CM | POA: Diagnosis not present

## 2021-03-03 DIAGNOSIS — H52201 Unspecified astigmatism, right eye: Secondary | ICD-10-CM | POA: Diagnosis not present

## 2021-03-03 DIAGNOSIS — H401421 Capsular glaucoma with pseudoexfoliation of lens, left eye, mild stage: Secondary | ICD-10-CM | POA: Diagnosis not present

## 2021-03-03 DIAGNOSIS — H401111 Primary open-angle glaucoma, right eye, mild stage: Secondary | ICD-10-CM | POA: Diagnosis not present

## 2021-03-05 ENCOUNTER — Ambulatory Visit (INDEPENDENT_AMBULATORY_CARE_PROVIDER_SITE_OTHER): Payer: PPO | Admitting: Family Medicine

## 2021-03-05 ENCOUNTER — Other Ambulatory Visit: Payer: Self-pay

## 2021-03-05 ENCOUNTER — Encounter: Payer: Self-pay | Admitting: Family Medicine

## 2021-03-05 VITALS — BP 134/76 | HR 74 | Temp 97.3°F | Ht 65.0 in | Wt 191.4 lb

## 2021-03-05 DIAGNOSIS — E7800 Pure hypercholesterolemia, unspecified: Secondary | ICD-10-CM | POA: Diagnosis not present

## 2021-03-05 DIAGNOSIS — I10 Essential (primary) hypertension: Secondary | ICD-10-CM

## 2021-03-05 DIAGNOSIS — I4891 Unspecified atrial fibrillation: Secondary | ICD-10-CM

## 2021-03-05 NOTE — Progress Notes (Addendum)
[facility] [address] [place] [address] Dept: [phone] Dept Fax: [phone]  Chronic Care Office Visit  Subjective:    Patient ID: TINY RIETZ, male    DOB: 29-Oct-1924, 87 y.o..   MRN: [id number]  Chief Complaint  Patient presents with   Follow-up    3 month f/u HTN.  No concerns.      History of Present Illness:  Patient is in today for reassessment of chronic medical issues.  Mr. Shen has a history of CAD and has had several prior open heart surgeries, including CABG and aortic valve replacement (tissue valve). He has had some hypertension and is managed on amlodipine and hydralazine. He has a history of hyperlipidemia and is on Lipitor. He is also managed on Lasix and metoprolol, related to his heart disease. He keeps NTG on hand. Mr. Schueller has had some ongoing issues with pedal edema. He feels this is stable. He does note his weight is up a bit, but feels this is more related to eating more sweets. He remains active. His son notes, Mr. Poland was able to walk 50 yards out and pull the trash can back to the house this morning. Mr. Matters does note that when he stands up, he has to pause for a bit for dizziness to subside. He admits to occasional falls. He states these occur when he turns too quickly and his head feels heavy.   Past Medical History: Patient Active Problem List   Diagnosis Date Noted   Glaucoma 11/30/2020   Carotid artery disease (HCC) 11/12/2013   Claudication (HCC) 12/12/2012   Peripheral vascular disease (HCC) 12/12/2012   Carotid bruit 08/23/2011   Atrial fibrillation with RVR (HCC) 01/12/2011   Essential hypertension    Hyperlipidemia    Coronary artery disease    Status post aortic valve replacement with tissue    Past Surgical History:  Procedure Laterality Date   APPENDECTOMY     CARDIAC CATHETERIZATION  04/02/2007   EF 60%/severe two-vessel obstructive coronary artery  disease/patent saphenous bein graft to rt coronary/ severe stenosis at the ostium of the lt internal mammary artery graft to the abtuse marginal branch/normal lt ventricular function/ severe aortic stenosis/normal rt heart pressures   CARDIAC CATHETERIZATION  07/25/2002   EF 65%/two-vessel obstructive atherosclerotic coronary artery disease/ patent saphenous vein graft to the distal rt coronary arter//patent lt internal mammary artery graft to the obtuse marginal branch/normal lt ventricular function   CARDIAC CATHETERIZATION  05/17/1991   EF65%/two-vessel ovstructive atherosclerotic coronary artery disease/good lt ventricular performance/compared with previous catheterization there is now total occlusion of the tr coronary artery at the prior angioplasty site/good collateral flow to the distal rt coronary artery is seen/there is also progressive disease in the lt circumflex vessel now to obstructive levels   CARDIAC CATHETERIZATION  07/26/1990   EF60%/borderline obstructive coronary artery disease in the mid lt circumflex & in the rt coronary artery prior angioplasty site/normal lt ventricular function   CARDIAC CATHETERIZATION  05/23/1990   successful percutaneous transluminal coronary angioplasty of the proximal rt coronary artery   CATARACT EXTRACTION W/ INTRAOCULAR LENS IMPLANT Bilateral    CORONARY ANGIOPLASTY  07/12/1990   single vessel obstructive atherosclerotic coronary atrery disease in the rt corornary artery with restenosis of the primary angioplasty site/successful repeat PTCA of the proximal rt coronary arter   CORONARY ARTERY BYPASS GRAFT     CORONARY ARTERY BYPASS GRAFT     x2 using a reverse saphenous vein  graft to posterior descending, reseverse saphenous vein graft to lt internal mammary artery at the previous bypass to the circumflex with a no vein harvesting   GANGLION CYST EXCISION Right    Wrist   HEMORRHOID SURGERY     at age 66   INGUINAL HERNIA REPAIR  05/02/1965    PENILE PROSTHESIS IMPLANT  05/03/1987   STERNOTOMY     redo median with aortic valve replacement with a pericardial tissue valve/[employer] model 3000,56mm,serial numver [id number]   TONSILLECTOMY     at age 46   VASECTOMY     at age 64   Family History  Problem Relation Age of Onset   Cancer Father 68       intestinal   Heart attack Mother 18   Stroke Sister    Outpatient Medications Prior to Visit  Medication Sig Dispense Refill   aspirin (ASPIRIN CHILDRENS) 81 MG chewable tablet Chew 1 tablet (81 mg total) by mouth daily. 36 tablet 11   atorvastatin (LIPITOR) 40 MG tablet Take 1 tablet (40 mg total) by mouth daily. 90 tablet 3   COMBIGAN 0.2-0.5 % ophthalmic solution Place 1 drop into the left eye every 12 (twelve) hours.      furosemide (LASIX) 20 MG tablet Take 1 tablet (20 mg total) by mouth daily. 90 tablet 3   hydrALAZINE (APRESOLINE) 25 MG tablet Take 1 tablet (25 mg total) by mouth 3 (three) times daily. 270 tablet 3   metoprolol succinate (TOPROL XL) 25 MG 24 hr tablet Take 1 tablet (25 mg total) by mouth daily. 90 tablet 3   nitroGLYCERIN (NITROSTAT) 0.4 MG SL tablet Place 1 tablet (0.4 mg total) under the tongue every 5 (five) minutes as needed. 25 tablet 1   TRAVATAN Z 0.004 % SOLN ophthalmic solution Place 1 drop into the left eye at bedtime.      amLODipine (NORVASC) 5 MG tablet Take 1 tablet (5 mg total) by mouth daily. 90 tablet 3   No facility-administered medications prior to visit.   Allergies  Allergen Reactions   Codeine     Hallucinations   Penicillins Hives      Objective:   Today's Vitals   03/05/21 0957  BP: 134/76  Pulse: 74  Temp: (!) 97.3 F (36.3 C)  TempSrc: Temporal  SpO2: 97%  Weight: 191 lb 6.4 oz (86.8 kg)  Height: 5\' 5"  (1.651 m)   Body mass index is 31.85 kg/m.   General: Well developed, well nourished. No acute distress. Lungs: Clear to auscultation bilaterally. No wheezing, rales or rhonchi. CV: irregular rhythm, but  normal rate. No murmurs or rubs. Pulses 2+ bilaterally. Extremities: 2-3+ puffy edema of lower legs. Neuro:Patient gets to stand unassisted in < 2 secs, but appears to wobble briefly once up. Psych: Alert and oriented. Normal mood and affect.  Health Maintenance Due  Topic Date Due   TETANUS/TDAP  Never done   Zoster Vaccines- Shingrix (1 of 2) Never done   Pneumonia Vaccine 59+ Years old (2 - PPSV23 if available, else PCV20) 02/13/2019     Lab Results: Lab Results  Component Value Date   CHOL 159 06/08/2020   HDL 47 06/08/2020   LDLCALC 90 06/08/2020   TRIG 123 06/08/2020   CHOLHDL 3.4 06/08/2020   BMP Latest Ref Rng & Units 12/14/2020 06/24/2020 06/09/2020  Glucose 65 - 99 mg/dL 08/07/2020) 761(Y) 86  BUN 10 - 36 mg/dL 28 24 073(X)  Creatinine 0.76 - 1.27 mg/dL 10(G) 2.69(S)  1.80(H)  BUN/Creat Ratio 10 - 24 18 17 23   Sodium 134 - 144 mmol/L 143 141 142  Potassium 3.5 - 5.2 mmol/L 5.1 4.6 5.7(H)  Chloride 96 - 106 mmol/L 105 108(H) 109(H)  CO2 20 - 29 mmol/L 23 20 18(L)  Calcium 8.6 - 10.2 mg/dL 9.2 8.8 9.2    Assessment & Plan:   1. Essential hypertension Mr. Mcgurn's blood pressure today is normal. I remain concerned about his lightheadedness and increased fall risk. He takes ibuprofen regularly for his back pain, which likely does contribute to his pedal edema. However, his amlodipine could play a role in this as well. I discussed with Mr. Kozma and his son about possibly stopping the amlodipine. They are hesitant to do so without his cardiologist, Dr. Earl Gala, concurrence. I will reach out to Dr. Elvis Coil to discuss this.  2. Atrial fibrillation with RVR (HCC) Currently well controlled with metoprolol.  3. Pure hypercholesterolemia At goal earlier this year. We will plan to recheck fasting labs at his next visit.   [place], MD  Addendum- Dr. Loyola Mast agreed with plan to stop the amlodipine. Noted to monitor for any increased angina. I called and discussed with Mr.  Camps son. He will stop this as of tomorrow.  Newell Coral, MD

## 2021-03-16 ENCOUNTER — Telehealth: Payer: Self-pay | Admitting: Family Medicine

## 2021-03-16 NOTE — Telephone Encounter (Signed)
Left message for patient's son, Lesly Rubenstein, to call back and schedule Medicare Annual Wellness Visit (AWV) in office.   If not able to come in office, please offer to do virtually or by telephone.  Left office number and my jabber [phone].  Due for AWVI  Please schedule at anytime with Nurse Health Advisor.

## 2021-03-18 ENCOUNTER — Other Ambulatory Visit: Payer: Self-pay

## 2021-03-18 ENCOUNTER — Encounter: Payer: Self-pay | Admitting: Family Medicine

## 2021-03-18 ENCOUNTER — Ambulatory Visit (INDEPENDENT_AMBULATORY_CARE_PROVIDER_SITE_OTHER): Payer: PPO

## 2021-03-18 ENCOUNTER — Ambulatory Visit (INDEPENDENT_AMBULATORY_CARE_PROVIDER_SITE_OTHER): Payer: PPO | Admitting: Family Medicine

## 2021-03-18 VITALS — BP 154/76 | HR 71 | Temp 98.1°F | Ht 65.0 in | Wt 188.6 lb

## 2021-03-18 DIAGNOSIS — M7989 Other specified soft tissue disorders: Secondary | ICD-10-CM | POA: Diagnosis not present

## 2021-03-18 DIAGNOSIS — S99912A Unspecified injury of left ankle, initial encounter: Secondary | ICD-10-CM | POA: Diagnosis not present

## 2021-03-18 DIAGNOSIS — I10 Essential (primary) hypertension: Secondary | ICD-10-CM

## 2021-03-18 DIAGNOSIS — S9032XA Contusion of left foot, initial encounter: Secondary | ICD-10-CM

## 2021-03-18 DIAGNOSIS — M25572 Pain in left ankle and joints of left foot: Secondary | ICD-10-CM | POA: Diagnosis not present

## 2021-03-18 DIAGNOSIS — M79672 Pain in left foot: Secondary | ICD-10-CM | POA: Diagnosis not present

## 2021-03-18 NOTE — Progress Notes (Signed)
[facility] [address] [place] [address] Dept: [phone] Dept Fax: [phone]  Office Visit  Subjective:    Patient ID: [name], male    DOB: 06-15-1924, 87 y.o..   MRN: [id number]  Chief Complaint  Patient presents with   Acute Visit    C/o having LT ankle pain/swelling after being on a ladder.      History of Present Illness:  Patient is in today for evaluation of a recent left foot injury. Two weeks ago, he was using an aluminum ladder to clean out his gutters. He notes he accidentally dropped the ladder on his left foot. He ahs had pain and swellign of the left foot and ankle since that time. He has been able to ambulate okay. Mr. Mannis has a history of ankle edema. We had recently stopped his amlodipine. The orthostasis is doing better at this point.  Past Medical History: Patient Active Problem List   Diagnosis Date Noted   Glaucoma 11/30/2020   Carotid artery disease (HCC) 11/12/2013   Claudication (HCC) 12/12/2012   Peripheral vascular disease (HCC) 12/12/2012   Carotid bruit 08/23/2011   Atrial fibrillation with RVR (HCC) 01/12/2011   Essential hypertension    Hyperlipidemia    Coronary artery disease    Status post aortic valve replacement with tissue    Past Surgical History:  Procedure Laterality Date   APPENDECTOMY     CARDIAC CATHETERIZATION  04/02/2007   EF 60%/severe two-vessel obstructive coronary artery disease/patent saphenous bein graft to rt coronary/ severe stenosis at the ostium of the lt internal mammary artery graft to the abtuse marginal branch/normal lt ventricular function/ severe aortic stenosis/normal rt heart pressures   CARDIAC CATHETERIZATION  07/25/2002   EF 65%/two-vessel obstructive atherosclerotic coronary artery disease/ patent saphenous vein graft to the distal rt coronary arter//patent lt internal mammary artery graft to the obtuse marginal branch/normal lt  ventricular function   CARDIAC CATHETERIZATION  05/17/1991   EF65%/two-vessel ovstructive atherosclerotic coronary artery disease/good lt ventricular performance/compared with previous catheterization there is now total occlusion of the tr coronary artery at the prior angioplasty site/good collateral flow to the distal rt coronary artery is seen/there is also progressive disease in the lt circumflex vessel now to obstructive levels   CARDIAC CATHETERIZATION  07/26/1990   EF60%/borderline obstructive coronary artery disease in the mid lt circumflex & in the rt coronary artery prior angioplasty site/normal lt ventricular function   CARDIAC CATHETERIZATION  05/23/1990   successful percutaneous transluminal coronary angioplasty of the proximal rt coronary artery   CATARACT EXTRACTION W/ INTRAOCULAR LENS IMPLANT Bilateral    CORONARY ANGIOPLASTY  07/12/1990   single vessel obstructive atherosclerotic coronary atrery disease in the rt corornary artery with restenosis of the primary angioplasty site/successful repeat PTCA of the proximal rt coronary arter   CORONARY ARTERY BYPASS GRAFT     CORONARY ARTERY BYPASS GRAFT     x2 using a reverse saphenous vein graft to posterior descending, reseverse saphenous vein graft to lt internal mammary artery at the previous bypass to the circumflex with a no vein harvesting   GANGLION CYST EXCISION Right    Wrist   HEMORRHOID SURGERY     at age 33   INGUINAL HERNIA REPAIR  05/02/1965   PENILE PROSTHESIS IMPLANT  05/03/1987   STERNOTOMY     redo median with aortic valve replacement with a pericardial tissue valve/[employer] model 3000,46mm,serial numver [id number]   TONSILLECTOMY  at age 46   VASECTOMY     at age 43   Family History  Problem Relation Age of Onset   Cancer Father 36       intestinal   Heart attack Mother 65   Stroke Sister    Outpatient Medications Prior to Visit  Medication Sig Dispense Refill   aspirin (ASPIRIN CHILDRENS) 81  MG chewable tablet Chew 1 tablet (81 mg total) by mouth daily. 36 tablet 11   atorvastatin (LIPITOR) 40 MG tablet Take 1 tablet (40 mg total) by mouth daily. 90 tablet 3   COMBIGAN 0.2-0.5 % ophthalmic solution Place 1 drop into the left eye every 12 (twelve) hours.      furosemide (LASIX) 20 MG tablet Take 1 tablet (20 mg total) by mouth daily. 90 tablet 3   metoprolol succinate (TOPROL XL) 25 MG 24 hr tablet Take 1 tablet (25 mg total) by mouth daily. 90 tablet 3   nitroGLYCERIN (NITROSTAT) 0.4 MG SL tablet Place 1 tablet (0.4 mg total) under the tongue every 5 (five) minutes as needed. 25 tablet 1   TRAVATAN Z 0.004 % SOLN ophthalmic solution Place 1 drop into the left eye at bedtime.      amLODipine (NORVASC) 5 MG tablet Take 1 tablet (5 mg total) by mouth daily. 90 tablet 3   hydrALAZINE (APRESOLINE) 25 MG tablet Take 1 tablet (25 mg total) by mouth 3 (three) times daily. 270 tablet 3   No facility-administered medications prior to visit.   Allergies  Allergen Reactions   Codeine     Hallucinations   Penicillins Hives   Objective:   Today's Vitals   03/18/21 1052  BP: (!) 154/76  Pulse: 71  Temp: 98.1 F (36.7 C)  TempSrc: Temporal  SpO2: 96%  Weight: 188 lb 9.6 oz (85.5 kg)  Height: 5\' 5"  (1.651 m)   Body mass index is 31.38 kg/m.   General: Well developed, well nourished. No acute distress. Extremities: There is swelling of both lower legs, though L> R. There is also mild-moderate swelling fo the left foot with some mild redness over the lateral dorsumof the   foot.  Psych: Alert and oriented x3. Normal mood and affect.  Health Maintenance Due  Topic Date Due   TETANUS/TDAP  Never done   Zoster Vaccines- Shingrix (1 of 2) Never done   Pneumonia Vaccine 40+ Years old (2 - PPSV23 if available, else PCV20) 02/13/2019   Imaging Left ankle x-ray- No fracture or dislocation. X-ray evidence of atherosclerosis of arteries near ankle. Left foot x-ray:- No fracture or  dislocation. X-ray evidence of atherosclerosis of arteries near ankle.    Assessment & Plan:   1. Contusion of left foot, initial encounter The injury appears to be bruising only. No evidence of fracture. I recommend relative rest, hot soaks with ROM twice a day, and Tylenol as needed for pain. Follow-up if not improving.  - DG Foot Complete Left - DG Ankle Complete Left  2. Essential hypertension Blood pressure is slightly higher systolic off of amlodipine, but orthostasis in improved. I recommend we continue with holding the amlodipine.  Haydee Salter, MD

## 2021-04-01 ENCOUNTER — Other Ambulatory Visit: Payer: Self-pay

## 2021-04-01 ENCOUNTER — Emergency Department (HOSPITAL_COMMUNITY)
Admission: EM | Admit: 2021-04-01 | Discharge: 2021-04-02 | Disposition: A | Discharge status: Home / Self Care | Payer: PPO | Attending: Emergency Medicine | Admitting: Emergency Medicine

## 2021-04-01 ENCOUNTER — Encounter (HOSPITAL_COMMUNITY): Payer: Self-pay | Admitting: Emergency Medicine

## 2021-04-01 DIAGNOSIS — G4489 Other headache syndrome: Secondary | ICD-10-CM | POA: Diagnosis not present

## 2021-04-01 DIAGNOSIS — I10 Essential (primary) hypertension: Secondary | ICD-10-CM | POA: Diagnosis not present

## 2021-04-01 DIAGNOSIS — Z951 Presence of aortocoronary bypass graft: Secondary | ICD-10-CM | POA: Diagnosis not present

## 2021-04-01 DIAGNOSIS — I2510 Atherosclerotic heart disease of native coronary artery without angina pectoris: Secondary | ICD-10-CM | POA: Insufficient documentation

## 2021-04-01 DIAGNOSIS — Z79899 Other long term (current) drug therapy: Secondary | ICD-10-CM | POA: Diagnosis not present

## 2021-04-01 DIAGNOSIS — Z87891 Personal history of nicotine dependence: Secondary | ICD-10-CM | POA: Insufficient documentation

## 2021-04-01 DIAGNOSIS — Z7982 Long term (current) use of aspirin: Secondary | ICD-10-CM | POA: Insufficient documentation

## 2021-04-01 DIAGNOSIS — R519 Headache, unspecified: Secondary | ICD-10-CM | POA: Diagnosis not present

## 2021-04-01 LAB — CBC WITH DIFFERENTIAL/PLATELET
Abs Immature Granulocytes: 0.02 10*3/uL (ref 0.00–0.07)
Basophils Absolute: 0 10*3/uL (ref 0.0–0.1)
Basophils Relative: 1 %
Eosinophils Absolute: 0.1 10*3/uL (ref 0.0–0.5)
Eosinophils Relative: 2 %
HCT: 37.8 % — ABNORMAL LOW (ref 39.0–52.0)
Hemoglobin: 12 g/dL — ABNORMAL LOW (ref 13.0–17.0)
Immature Granulocytes: 0 %
Lymphocytes Relative: 28 %
Lymphs Abs: 2 10*3/uL (ref 0.7–4.0)
MCH: 28.7 pg (ref 26.0–34.0)
MCHC: 31.7 g/dL (ref 30.0–36.0)
MCV: 90.4 fL (ref 80.0–100.0)
Monocytes Absolute: 0.6 10*3/uL (ref 0.1–1.0)
Monocytes Relative: 8 %
Neutro Abs: 4.3 10*3/uL (ref 1.7–7.7)
Neutrophils Relative %: 61 %
Platelets: 176 10*3/uL (ref 150–400)
RBC: 4.18 MIL/uL — ABNORMAL LOW (ref 4.22–5.81)
RDW: 15 % (ref 11.5–15.5)
WBC: 7.1 10*3/uL (ref 4.0–10.5)
nRBC: 0 % (ref 0.0–0.2)

## 2021-04-01 LAB — COMPREHENSIVE METABOLIC PANEL
ALT: 14 U/L (ref 0–44)
AST: 20 U/L (ref 15–41)
Albumin: 3.5 g/dL (ref 3.5–5.0)
Alkaline Phosphatase: 65 U/L (ref 38–126)
Anion gap: 6 (ref 5–15)
BUN: 21 mg/dL (ref 8–23)
CO2: 26 mmol/L (ref 22–32)
Calcium: 9 mg/dL (ref 8.9–10.3)
Chloride: 108 mmol/L (ref 98–111)
Creatinine, Ser: 1.46 mg/dL — ABNORMAL HIGH (ref 0.61–1.24)
GFR, Estimated: 44 mL/min — ABNORMAL LOW (ref >60)
Glucose, Bld: 104 mg/dL — ABNORMAL HIGH (ref 70–99)
Potassium: 4.7 mmol/L (ref 3.5–5.1)
Sodium: 140 mmol/L (ref 135–145)
Total Bilirubin: 0.8 mg/dL (ref 0.3–1.2)
Total Protein: 6.8 g/dL (ref 6.5–8.1)

## 2021-04-01 NOTE — ED Provider Notes (Signed)
Emergency Medicine Provider Triage Evaluation Note  [name] , a 87 y.o. male  was evaluated in triage.  Pt complains of hypertension along with headache x a few days.  Symptoms have worsening today.  Taking ibuprofen without any improvement in symptoms.  No trauma, no falls, currently on no blood thinners.reports no changed in his gate.   Review of Systems  Positive: Headache,  Negative: Nausea, vomiting, photophobia  Physical Exam  BP (!) 196/73 (BP Location: Right Arm)   Pulse 65   Temp 98 F (36.7 C) (Oral)   Resp 18   SpO2 97%  Gen:   Awake, no distress   Resp:  Normal effort  MSK:   Moves extremities without difficulty  Other:    Medical Decision Making  Medically screening exam initiated at 8:56 PM.  Appropriate orders placed.  [name] was informed that the remainder of the evaluation will be completed by another provider, this initial triage assessment does not replace that evaluation, and the importance of remaining in the ED until their evaluation is complete.     Corey Manges, PA-C 04/01/21 2100    Corey Bale, MD 04/01/21 [phone]

## 2021-04-01 NOTE — ED Notes (Signed)
PT son [name] would like an update when pt is moved into room Number in in pt contact info

## 2021-04-01 NOTE — ED Notes (Signed)
Patient moved to recliner for comfort °

## 2021-04-01 NOTE — ED Triage Notes (Signed)
Pt BIB [facility] from home, c/o headache and hypertension. Pt called EMS earlier in the day for same, took hydralazine at that time, symptoms did not improve. EMS SBP 220. Pt A&Ox4.

## 2021-04-02 ENCOUNTER — Telehealth: Payer: Self-pay | Admitting: Cardiology

## 2021-04-02 ENCOUNTER — Encounter (HOSPITAL_COMMUNITY): Payer: Self-pay

## 2021-04-02 ENCOUNTER — Encounter: Payer: Self-pay | Admitting: Cardiology

## 2021-04-02 ENCOUNTER — Emergency Department (HOSPITAL_COMMUNITY): Payer: PPO

## 2021-04-02 DIAGNOSIS — R519 Headache, unspecified: Secondary | ICD-10-CM | POA: Diagnosis not present

## 2021-04-02 MED ORDER — HYDRALAZINE HCL 25 MG PO TABS
50.0000 mg | ORAL_TABLET | Freq: Once | ORAL | Status: AC
  Administered 2021-04-02: 50 mg via ORAL
  Filled 2021-04-02: qty 2

## 2021-04-02 NOTE — Telephone Encounter (Signed)
Spoke to patient's son [name].He stated father just told him he has been having severe headaches for the past 2 weeks.Stated he has been waiting all night at [facility] ED with elevated B/P.Stated he was just told it will be about 3 more hours before he is seen.Advised to wait and be evaluated.Dr.Jordan is not in office today.I will make him aware.

## 2021-04-02 NOTE — ED Provider Notes (Signed)
[facility] EMERGENCY DEPARTMENT Provider Note   CSN: [id number] Arrival date & time: 04/01/21  2037     History Chief Complaint  Patient presents with   Hypertension    [name] is a 87 y.o. male with a past medical history of CAD, hyperlipidemia, hypertension who presents to the ED complaining of elevated blood pressure onset prior to arrival.  Patient has associated gradual onset right-sided headache.  He reports his headache feels like a squeezing sensation.  He has taken ibuprofen for his symptoms with his last dose being last night at 6 PM.  Patient denies vision changes, abdominal pain, fever, chills, nausea, vomiting, chest pain.  Patient takes hydralazine and metoprolol.   The history is provided by the patient. No language interpreter was used.      Past Medical History:  Diagnosis Date   Carotid arterial disease (HCC)    Doppler in 2003 with 40 to 60% L ICA   Carpal tunnel syndrome    Coronary artery disease    s/p CABG & redo CABG with AVR   Hyperlipidemia    Hypertension    Meningitis    Normal echocardiogram Sept 2012   Has normal EF, mild AS and moderate LAE   PAF (paroxysmal atrial fibrillation) (HCC) Sept 2012   converted spontaneously   PUD (peptic ulcer disease)    Remote history   Status post aortic valve replacement with tissue    for aortic stenosis    Patient Active Problem List   Diagnosis Date Noted   Glaucoma 11/30/2020   Carotid artery disease (HCC) 11/12/2013   Claudication (HCC) 12/12/2012   Peripheral vascular disease (HCC) 12/12/2012   Carotid bruit 08/23/2011   Atrial fibrillation with RVR (HCC) 01/12/2011   Essential hypertension    Hyperlipidemia    Coronary artery disease    Status post aortic valve replacement with tissue     Past Surgical History:  Procedure Laterality Date   APPENDECTOMY     CARDIAC CATHETERIZATION  04/02/2007   EF 60%/severe two-vessel obstructive coronary artery disease/patent  saphenous bein graft to rt coronary/ severe stenosis at the ostium of the lt internal mammary artery graft to the abtuse marginal branch/normal lt ventricular function/ severe aortic stenosis/normal rt heart pressures   CARDIAC CATHETERIZATION  07/25/2002   EF 65%/two-vessel obstructive atherosclerotic coronary artery disease/ patent saphenous vein graft to the distal rt coronary arter//patent lt internal mammary artery graft to the obtuse marginal branch/normal lt ventricular function   CARDIAC CATHETERIZATION  05/17/1991   EF65%/two-vessel ovstructive atherosclerotic coronary artery disease/good lt ventricular performance/compared with previous catheterization there is now total occlusion of the tr coronary artery at the prior angioplasty site/good collateral flow to the distal rt coronary artery is seen/there is also progressive disease in the lt circumflex vessel now to obstructive levels   CARDIAC CATHETERIZATION  07/26/1990   EF60%/borderline obstructive coronary artery disease in the mid lt circumflex & in the rt coronary artery prior angioplasty site/normal lt ventricular function   CARDIAC CATHETERIZATION  05/23/1990   successful percutaneous transluminal coronary angioplasty of the proximal rt coronary artery   CATARACT EXTRACTION W/ INTRAOCULAR LENS IMPLANT Bilateral    CORONARY ANGIOPLASTY  07/12/1990   single vessel obstructive atherosclerotic coronary atrery disease in the rt corornary artery with restenosis of the primary angioplasty site/successful repeat PTCA of the proximal rt coronary arter   CORONARY ARTERY BYPASS GRAFT     CORONARY ARTERY BYPASS GRAFT     x2 using a  reverse saphenous vein graft to posterior descending, reseverse saphenous vein graft to lt internal mammary artery at the previous bypass to the circumflex with a no vein harvesting   GANGLION CYST EXCISION Right    Wrist   HEMORRHOID SURGERY     at age 89   INGUINAL HERNIA REPAIR  05/02/1965   PENILE PROSTHESIS  IMPLANT  05/03/1987   STERNOTOMY     redo median with aortic valve replacement with a pericardial tissue valve/[employer] model 3000,44mm,serial numver [id number]   TONSILLECTOMY     at age 33   VASECTOMY     at age 29       Family History  Problem Relation Age of Onset   Cancer Father 4       intestinal   Heart attack Mother 60   Stroke Sister     Social History   Tobacco Use   Smoking status: Former    Packs/day: 1.50    Years: 41.00    Pack years: 61.50    Types: Cigarettes, Cigars    Quit date: 05/02/1982    Years since quitting: 38.9   Smokeless tobacco: Never  Substance Use Topics   Alcohol use: No   Drug use: No    Home Medications Prior to Admission medications   Medication Sig Start Date End Date Taking? Authorizing Provider  aspirin (ASPIRIN CHILDRENS) 81 MG chewable tablet Chew 1 tablet (81 mg total) by mouth daily. 01/19/11   Rosalio Macadamia, NP  atorvastatin (LIPITOR) 40 MG tablet Take 1 tablet (40 mg total) by mouth daily. 06/08/20   [place], Peter M, MD  COMBIGAN 0.2-0.5 % ophthalmic solution Place 1 drop into the left eye every 12 (twelve) hours.  12/06/12   [provider]  furosemide (LASIX) 20 MG tablet Take 1 tablet (20 mg total) by mouth daily. 09/07/20 09/02/21  [place], Peter M, MD  hydrALAZINE (APRESOLINE) 25 MG tablet Take 1 tablet (25 mg total) by mouth 3 (three) times daily. 06/17/20 03/05/21  [place], Peter M, MD  metoprolol succinate (TOPROL XL) 25 MG 24 hr tablet Take 1 tablet (25 mg total) by mouth daily. 06/08/20   [place], Peter M, MD  nitroGLYCERIN (NITROSTAT) 0.4 MG SL tablet Place 1 tablet (0.4 mg total) under the tongue every 5 (five) minutes as needed. 07/24/20   [place], Peter M, MD  TRAVATAN Z 0.004 % SOLN ophthalmic solution Place 1 drop into the left eye at bedtime.  12/06/12   [provider]    Allergies    Codeine and Penicillins  Review of Systems   Review of Systems  Constitutional:  Negative for chills and fever.   Respiratory:  Negative for shortness of breath.   Cardiovascular:  Negative for chest pain.  Gastrointestinal:  Negative for abdominal pain, nausea and vomiting.  Skin:  Negative for rash.  Neurological:  Positive for headaches. Negative for dizziness, syncope and light-headedness.  All other systems reviewed and are negative.  Physical Exam Updated Vital Signs BP (!) 139/53   Pulse 70   Temp 98 F (36.7 C) (Oral)   Resp (!) 23   SpO2 95%   Physical Exam Vitals and nursing note reviewed.  Constitutional:      General: He is not in acute distress.    Appearance: He is not diaphoretic.  HENT:     Head: Normocephalic and atraumatic.     Mouth/Throat:     Mouth: Mucous membranes are moist.     Pharynx: Oropharynx  is clear. No oropharyngeal exudate.  Eyes:     General: No scleral icterus.    Conjunctiva/sclera: Conjunctivae normal.  Cardiovascular:     Rate and Rhythm: Normal rate and regular rhythm.     Pulses: Normal pulses.     Heart sounds: Normal heart sounds.  Pulmonary:     Effort: Pulmonary effort is normal. No respiratory distress.     Breath sounds: Normal breath sounds. No wheezing.  Abdominal:     General: Bowel sounds are normal.     Palpations: Abdomen is soft. There is no mass.     Tenderness: There is no abdominal tenderness. There is no guarding or rebound.  Musculoskeletal:        General: Normal range of motion.     Cervical back: Normal range of motion and neck supple.     Comments: Strength and sensation intact to bilateral upper and lower extremities.  Skin:    General: Skin is warm and dry.  Neurological:     Mental Status: He is alert.  Psychiatric:        Behavior: Behavior normal.    ED Results / Procedures / Treatments   Labs (all labs ordered are listed, but only abnormal results are displayed) Labs Reviewed  CBC WITH DIFFERENTIAL/PLATELET - Abnormal; Notable for the following components:      Result Value   RBC 4.18 (*)     Hemoglobin 12.0 (*)    HCT 37.8 (*)    All other components within normal limits  COMPREHENSIVE METABOLIC PANEL - Abnormal; Notable for the following components:   Glucose, Bld 104 (*)    Creatinine, Ser 1.46 (*)    GFR, Estimated 44 (*)    All other components within normal limits    EKG None  Radiology CT Head Wo Contrast  Result Date: 04/02/2021 CLINICAL DATA:  Headache EXAM: CT HEAD WITHOUT CONTRAST TECHNIQUE: Contiguous axial images were obtained from the base of the skull through the vertex without intravenous contrast. COMPARISON:  None. FINDINGS: Brain: No acute intracranial hemorrhage, mass effect, or herniation. No extra-axial fluid collections. No evidence of acute territorial infarct. No hydrocephalus. Mild cortical volume loss. Patchy hypodensities throughout the periventricular and subcortical white matter, likely secondary to chronic microvascular ischemic changes. Vascular: Calcified plaques in the carotid siphons. Skull: Normal. Negative for fracture or focal lesion. Sinuses/Orbits: No acute finding. Other: None. IMPRESSION: Chronic changes with no acute intracranial process identified. Electronically Signed   By: Jannifer Hick M.D.   On: 04/02/2021 14:48    Procedures Procedures   Medications Ordered in ED Medications  hydrALAZINE (APRESOLINE) tablet 50 mg (50 mg Oral Given 04/02/21 1305)    ED Course  I have reviewed the triage vital signs and the nursing notes.  Pertinent labs & imaging results that were available during my care of the patient were reviewed by me and considered in my medical decision making (see chart for details).  Clinical Course as of 04/02/21 1518  Fri Apr 02, 2021  1242 Repeat blood pressure elevated at 192/77. Takes hydralazine and toprol at home. Loast dose yesterday am [SB]  1425 Patient resting comfortably on stretcher.  Patient blood pressure at 177/73 while in the room.  Patient with improvement of his headache at this time.   Patient's son reported patient not taking hydralazine as prescribed.  Discussed with patient and his son treatment plan. [SB]  1504 CT head findings discussed with patient and son at bedside.  Patient resting comfortably on the  stretcher.  Patient and son agreeable to discharge treatment plan.  Advised patient and son to call cardiology office and notify of the ED visit today. [SB]    Clinical Course User Index [SB] Thomasina Housley A, PA-C   MDM Rules/Calculators/A&P                         Pt presented to the ED with elevated blood pressure and right-sided headache x2 days.  His headache was gradual onset and worsening today.  Patient denies vision changes.  Patient takes hydralazine and metoprolol with no missed doses.  No chest pain or shortness of breath.  Initial blood pressure elevated at 196/73 upon arrival.  Differential diagnosis includes SAH, ICH, or hypertensive urgency.  Patient is afebrile with no focal neurodeficit.  Patient given at home antihypertensive medications in the ED. further conversation with patient and his son and notes that patient has not taken his hydralazine as prescribed.  Patient is only taking his hydralazine twice a day instead of 3 times a day as prescribed. EKG without acute ST/T changes.  CMP and CBC unremarkable.  CT head without any intracranial abnormality.  Patient headache resolved in the ED. Presentation less likely due to [facility] or ICH. This is likely headache in the setting of hypertensive urgency due to missed HTN medications. Repeat vital signs with blood pressure improved to 139/53. Patient resting comfortably on stretcher.  Case discussed with attending who agrees with giving home dose of hydralazine in the ED and CT head.  Discussed with patient and his son importance of taking hypertensive medications as prescribed.  Discussed with patient and his son importance of calling cardiologist to inform of ED visit and to schedule follow-up appointment.  Patient and  son both acknowledged and verbalized understanding and are agreeable to the plan.  Discussed strict return precautions with patient consisting of increasing persistent headache, chest pain, or worsening symptoms. Patient appears safe for discharge at this time.  Follow-up as indicated in the discharge paperwork.    Final Clinical Impression(s) / ED Diagnoses Final diagnoses:  Hypertension, unspecified type    Rx / DC Orders ED Discharge Orders     None        Michaeljohn Biss A, PA-C 04/02/21 1518    Horton, Clabe Seal, DO 04/05/21 1046

## 2021-04-02 NOTE — Discharge Instructions (Addendum)
Your CT head was negative in the ED today.  You were given 2 doses of your hydralazine in the ED.  Ensure to take your nighttime dose of hydralazine.  Make sure to take your antihypertensive medications as prescribed.  Call your cardiologist and inform of your ED visit today.  You may take over-the-counter as milligrams Tylenol as needed for pain.  You may follow with your primary care provider as needed.  Return to the emergency department if you are experiencing increasing/worsening headache, increasing blood pressure, chest pain, or worsening symptoms.

## 2021-04-02 NOTE — ED Notes (Signed)
Patient transported to CT 

## 2021-04-02 NOTE — Telephone Encounter (Signed)
  Pt's son is calling to f/u mychart message sent today. He would like to speak with Elnita Maxwell

## 2021-04-04 ENCOUNTER — Encounter: Payer: Self-pay | Admitting: Family Medicine

## 2021-04-05 ENCOUNTER — Other Ambulatory Visit: Payer: Self-pay

## 2021-04-05 ENCOUNTER — Telehealth: Payer: Self-pay | Admitting: Cardiology

## 2021-04-05 MED ORDER — HYDRALAZINE HCL 50 MG PO TABS: 50.0000 mg | ORAL_TABLET | Freq: Three times a day (TID) | ORAL | 3 refills | Status: DC

## 2021-04-05 NOTE — Telephone Encounter (Signed)
Spoke with pt's son. See chart.

## 2021-04-05 NOTE — Progress Notes (Signed)
Prescription sent to pharmacy.

## 2021-04-05 NOTE — Telephone Encounter (Signed)
Pt c/o BP issue: STAT if pt c/o blurred vision, one-sided weakness or slurred speech  1. What are your last 5 BP readings? 214/95  2. Are you having any other symptoms (ex. Dizziness, headache, blurred vision, passed out)? Headache   3. What is your BP issue? Son is concerned about how high his BP is. The patient went to the ED on Friday too

## 2021-04-05 NOTE — Telephone Encounter (Signed)
Spoke with pt's son. He states they gave a double dose of his medications, it came down to 170s but went right back up. After speaking with Dr. [place] he wants to increase pt's Hydralazine to 50 mg three times daily. Son made aware, agreeable with plan. New prescription send into pharmacy and follow-up appt made. Son made aware if blood pressure does not respond to medication he should reach back out to the office, as well as return to the Emergency Department. He verbalized understanding.

## 2021-04-08 ENCOUNTER — Other Ambulatory Visit: Payer: Self-pay

## 2021-04-08 ENCOUNTER — Encounter (HOSPITAL_COMMUNITY): Payer: Self-pay

## 2021-04-08 ENCOUNTER — Emergency Department (HOSPITAL_COMMUNITY): Payer: PPO

## 2021-04-08 ENCOUNTER — Telehealth: Payer: Self-pay

## 2021-04-08 ENCOUNTER — Observation Stay (HOSPITAL_COMMUNITY)
Admission: EM | Admit: 2021-04-08 | Discharge: 2021-04-12 | Disposition: A | Discharge status: Home / Self Care | Payer: PPO | Attending: Family Medicine | Admitting: Family Medicine

## 2021-04-08 DIAGNOSIS — G459 Transient cerebral ischemic attack, unspecified: Secondary | ICD-10-CM

## 2021-04-08 DIAGNOSIS — G319 Degenerative disease of nervous system, unspecified: Secondary | ICD-10-CM | POA: Diagnosis not present

## 2021-04-08 DIAGNOSIS — Z951 Presence of aortocoronary bypass graft: Secondary | ICD-10-CM | POA: Diagnosis not present

## 2021-04-08 DIAGNOSIS — Z7982 Long term (current) use of aspirin: Secondary | ICD-10-CM | POA: Diagnosis not present

## 2021-04-08 DIAGNOSIS — I160 Hypertensive urgency: Secondary | ICD-10-CM | POA: Diagnosis not present

## 2021-04-08 DIAGNOSIS — I2510 Atherosclerotic heart disease of native coronary artery without angina pectoris: Secondary | ICD-10-CM | POA: Diagnosis present

## 2021-04-08 DIAGNOSIS — I10 Essential (primary) hypertension: Secondary | ICD-10-CM | POA: Diagnosis present

## 2021-04-08 DIAGNOSIS — I129 Hypertensive chronic kidney disease with stage 1 through stage 4 chronic kidney disease, or unspecified chronic kidney disease: Secondary | ICD-10-CM | POA: Diagnosis not present

## 2021-04-08 DIAGNOSIS — Z20822 Contact with and (suspected) exposure to covid-19: Secondary | ICD-10-CM | POA: Diagnosis not present

## 2021-04-08 DIAGNOSIS — I25119 Atherosclerotic heart disease of native coronary artery with unspecified angina pectoris: Secondary | ICD-10-CM

## 2021-04-08 DIAGNOSIS — Z87891 Personal history of nicotine dependence: Secondary | ICD-10-CM | POA: Diagnosis not present

## 2021-04-08 DIAGNOSIS — R000 Tachycardia, unspecified: Secondary | ICD-10-CM | POA: Diagnosis not present

## 2021-04-08 DIAGNOSIS — R2681 Unsteadiness on feet: Secondary | ICD-10-CM | POA: Diagnosis not present

## 2021-04-08 DIAGNOSIS — G4489 Other headache syndrome: Secondary | ICD-10-CM | POA: Diagnosis not present

## 2021-04-08 DIAGNOSIS — I779 Disorder of arteries and arterioles, unspecified: Secondary | ICD-10-CM | POA: Diagnosis present

## 2021-04-08 DIAGNOSIS — Y900 Blood alcohol level of less than 20 mg/100 ml: Secondary | ICD-10-CM | POA: Insufficient documentation

## 2021-04-08 DIAGNOSIS — G9341 Metabolic encephalopathy: Secondary | ICD-10-CM | POA: Insufficient documentation

## 2021-04-08 DIAGNOSIS — I480 Paroxysmal atrial fibrillation: Secondary | ICD-10-CM | POA: Diagnosis not present

## 2021-04-08 DIAGNOSIS — R296 Repeated falls: Secondary | ICD-10-CM | POA: Diagnosis not present

## 2021-04-08 DIAGNOSIS — R279 Unspecified lack of coordination: Secondary | ICD-10-CM

## 2021-04-08 DIAGNOSIS — Z79899 Other long term (current) drug therapy: Secondary | ICD-10-CM | POA: Insufficient documentation

## 2021-04-08 DIAGNOSIS — N1832 Chronic kidney disease, stage 3b: Secondary | ICD-10-CM | POA: Diagnosis present

## 2021-04-08 DIAGNOSIS — N183 Chronic kidney disease, stage 3 unspecified: Secondary | ICD-10-CM | POA: Diagnosis present

## 2021-04-08 DIAGNOSIS — E785 Hyperlipidemia, unspecified: Secondary | ICD-10-CM | POA: Diagnosis present

## 2021-04-08 DIAGNOSIS — E7800 Pure hypercholesterolemia, unspecified: Secondary | ICD-10-CM

## 2021-04-08 DIAGNOSIS — R519 Headache, unspecified: Secondary | ICD-10-CM | POA: Diagnosis not present

## 2021-04-08 DIAGNOSIS — R29818 Other symptoms and signs involving the nervous system: Secondary | ICD-10-CM | POA: Diagnosis not present

## 2021-04-08 DIAGNOSIS — I4891 Unspecified atrial fibrillation: Secondary | ICD-10-CM | POA: Diagnosis present

## 2021-04-08 LAB — COMPREHENSIVE METABOLIC PANEL
ALT: 14 U/L (ref 0–44)
AST: 20 U/L (ref 15–41)
Albumin: 3.3 g/dL — ABNORMAL LOW (ref 3.5–5.0)
Alkaline Phosphatase: 64 U/L (ref 38–126)
Anion gap: 7 (ref 5–15)
BUN: 21 mg/dL (ref 8–23)
CO2: 26 mmol/L (ref 22–32)
Calcium: 8.9 mg/dL (ref 8.9–10.3)
Chloride: 107 mmol/L (ref 98–111)
Creatinine, Ser: 1.42 mg/dL — ABNORMAL HIGH (ref 0.61–1.24)
GFR, Estimated: 46 mL/min — ABNORMAL LOW (ref >60)
Glucose, Bld: 84 mg/dL (ref 70–99)
Potassium: 4 mmol/L (ref 3.5–5.1)
Sodium: 140 mmol/L (ref 135–145)
Total Bilirubin: 0.5 mg/dL (ref 0.3–1.2)
Total Protein: 6 g/dL — ABNORMAL LOW (ref 6.5–8.1)

## 2021-04-08 LAB — I-STAT CHEM 8, ED
BUN: 23 mg/dL (ref 8–23)
Calcium, Ion: 1.14 mmol/L — ABNORMAL LOW (ref 1.15–1.40)
Chloride: 107 mmol/L (ref 98–111)
Creatinine, Ser: 1.4 mg/dL — ABNORMAL HIGH (ref 0.61–1.24)
Glucose, Bld: 80 mg/dL (ref 70–99)
HCT: 35 % — ABNORMAL LOW (ref 39.0–52.0)
Hemoglobin: 11.9 g/dL — ABNORMAL LOW (ref 13.0–17.0)
Potassium: 4 mmol/L (ref 3.5–5.1)
Sodium: 142 mmol/L (ref 135–145)
TCO2: 27 mmol/L (ref 22–32)

## 2021-04-08 LAB — CBC
HCT: 37.4 % — ABNORMAL LOW (ref 39.0–52.0)
Hemoglobin: 12.1 g/dL — ABNORMAL LOW (ref 13.0–17.0)
MCH: 29.4 pg (ref 26.0–34.0)
MCHC: 32.4 g/dL (ref 30.0–36.0)
MCV: 90.8 fL (ref 80.0–100.0)
Platelets: 163 10*3/uL (ref 150–400)
RBC: 4.12 MIL/uL — ABNORMAL LOW (ref 4.22–5.81)
RDW: 14.9 % (ref 11.5–15.5)
WBC: 6.1 10*3/uL (ref 4.0–10.5)
nRBC: 0 % (ref 0.0–0.2)

## 2021-04-08 LAB — APTT: aPTT: 29 seconds (ref 24–36)

## 2021-04-08 LAB — DIFFERENTIAL
Abs Immature Granulocytes: 0.01 10*3/uL (ref 0.00–0.07)
Basophils Absolute: 0 10*3/uL (ref 0.0–0.1)
Basophils Relative: 0 %
Eosinophils Absolute: 0.1 10*3/uL (ref 0.0–0.5)
Eosinophils Relative: 2 %
Immature Granulocytes: 0 %
Lymphocytes Relative: 32 %
Lymphs Abs: 2 10*3/uL (ref 0.7–4.0)
Monocytes Absolute: 0.7 10*3/uL (ref 0.1–1.0)
Monocytes Relative: 12 %
Neutro Abs: 3.3 10*3/uL (ref 1.7–7.7)
Neutrophils Relative %: 54 %

## 2021-04-08 LAB — PROTIME-INR
INR: 0.9 (ref 0.8–1.2)
Prothrombin Time: 12.6 seconds (ref 11.4–15.2)

## 2021-04-08 LAB — SEDIMENTATION RATE: Sed Rate: 16 mm/hr (ref 0–16)

## 2021-04-08 LAB — RESP PANEL BY RT-PCR (FLU A&B, COVID) ARPGX2
Influenza A by PCR: NEGATIVE
Influenza B by PCR: NEGATIVE
SARS Coronavirus 2 by RT PCR: NEGATIVE

## 2021-04-08 LAB — ETHANOL: Alcohol, Ethyl (B): <10 mg/dL (ref <10)

## 2021-04-08 MED ORDER — GADOBUTROL 1 MMOL/ML IV SOLN
8.5000 mL | Freq: Once | INTRAVENOUS | Status: AC | PRN
  Administered 2021-04-08: 8.5 mL via INTRAVENOUS

## 2021-04-08 MED ORDER — HYDRALAZINE HCL 25 MG PO TABS
50.0000 mg | ORAL_TABLET | Freq: Three times a day (TID) | ORAL | Status: DC
  Administered 2021-04-08 (×2): 50 mg via ORAL
  Filled 2021-04-08 (×2): qty 2

## 2021-04-08 MED ORDER — SODIUM CHLORIDE 0.9 % IV BOLUS
500.0000 mL | Freq: Once | INTRAVENOUS | Status: AC
  Administered 2021-04-08: 500 mL via INTRAVENOUS

## 2021-04-08 MED ORDER — ACETAMINOPHEN 500 MG PO TABS
1000.0000 mg | ORAL_TABLET | Freq: Once | ORAL | Status: DC
  Filled 2021-04-08: qty 2

## 2021-04-08 MED ORDER — METOPROLOL SUCCINATE ER 25 MG PO TB24
25.0000 mg | ORAL_TABLET | Freq: Every day | ORAL | Status: DC
  Administered 2021-04-08 – 2021-04-09 (×2): 25 mg via ORAL
  Filled 2021-04-08 (×2): qty 1

## 2021-04-08 MED ORDER — IBUPROFEN 400 MG PO TABS
400.0000 mg | ORAL_TABLET | Freq: Once | ORAL | Status: AC
  Administered 2021-04-08: 400 mg via ORAL
  Filled 2021-04-08: qty 1

## 2021-04-08 MED ORDER — HYDRALAZINE HCL 20 MG/ML IJ SOLN
5.0000 mg | INTRAMUSCULAR | Status: AC
  Administered 2021-04-09: 5 mg via INTRAVENOUS
  Filled 2021-04-08: qty 1

## 2021-04-08 NOTE — ED Notes (Signed)
Pt does have hearing aides in both ears upon arrival

## 2021-04-08 NOTE — Telephone Encounter (Signed)
Patient's son, Lesly Rubenstein, called to report his dad has been having HA's and can't remember how to crochet and has been doing that since he was a child.   They are concerned that he might have had a stroke. I was at lunch when he called and when I called him back he had called 911 to have them come to the house. He was transported to the hospital. Dm/cma

## 2021-04-08 NOTE — ED Notes (Addendum)
Pt up to side of bed to use urinal but then dropped the urinal on the floor. Unable to get sample

## 2021-04-08 NOTE — ED Triage Notes (Addendum)
Pt bib [facility] from home with complaints of a right sided headache for 2 weeks on and off and not being able to remember how to do certain things this morning. Pt family says LKW 1800 yesterday. Pt states he took all of his medications this morning. Pt presents AOx4. EMS bp 190/72

## 2021-04-08 NOTE — H&P (Signed)
[name] [id number] DOB: May 16, 1924 DOA: 04/08/2021     PCP: Haydee Salter, MD   Outpatient Specialists:   CARDS:  Dr.Jodan    Patient arrived to ER on 04/08/21 at 9 Referred by Attending Carmin Muskrat, MD   Patient coming from: home Lives alone,        Chief Complaint:   Chief Complaint  Patient presents with   Stroke Symptoms    HPI: [name] is a 87 y.o. male with medical history significant of HTN, CAD, HLD, p A.fib    Presented with   right side headache for 2 wks she seemed confused not remembering how to do things LKW 1800 yesterday Feeling fatigued no CP Was seen in the ER last week for elevated BP and headache improved with BP control CT head at that time was negative He learned to crochet as a child at 22 yo old but today he was unsure how to do it This has alarmed him the most    Has   been vaccinated against COVID  and boosted   Initial COVID TEST  NEGATIVE   Lab Results  Component Value Date   [place] 04/08/2021    Regarding pertinent Chronic problems:    Hyperlipidemia -  on statins Lipitor Lipid Panel     Component Value Date/Time   CHOL 159 06/08/2020 0913   TRIG 123 06/08/2020 0913   HDL 47 06/08/2020 0913   CHOLHDL 3.4 06/08/2020 0913   CHOLHDL 3.5 11/16/2016 0802   VLDL 29 11/16/2016 0802   LDLCALC 90 06/08/2020 0913   LABVLDL 22 06/08/2020 0913    HTN on Hydralazine lasix, toprol     CAD  - On Aspirin, statin, BB                -  followed by cardiology                Sp CABG and AVR    obesity-   BMI Readings from Last 1 Encounters:  04/08/21 31.37 kg/m    A. Fib -  - CHA2DS2 vas score   4   Not on anticoagulation secondary to Risk of Falls,          -  Rate control:  Currently controlled with  Toprolol,       CKD stage IIIb- baseline Cr 1.4 Estimated Creatinine Clearance: 32.4 mL/min (A) (by C-G formula based on SCr of 1.4 mg/dL (H)).  Lab Results  Component Value Date    CREATININE 1.40 (H) 04/08/2021   CREATININE 1.42 (H) 04/08/2021   CREATININE 1.46 (H) 04/01/2021       Chronic anemia - baseline hg Hemoglobin & Hematocrit  Recent Labs    04/01/21 2116 04/08/21 1432 04/08/21 1438  HGB 12.0* 12.1* 11.9*     While in ER:  BP in 190 Restarted home meds and now BP down to 160's    ED Triage Vitals  Enc Vitals Group     BP 04/08/21 1419 (!) 190/57     Pulse Rate 04/08/21 1419 62     Resp 04/08/21 1419 16     Temp 04/08/21 1419 97.9 F (36.6 C)     Temp Source 04/08/21 1419 Oral     SpO2 04/08/21 1419 98 %     Weight 04/08/21 1420 188 lb 7.9 oz (85.5 kg)     Height 04/08/21 1420 5\' 5"  (1.651 m)     Head Circumference --  Peak Flow --      Pain Score 04/08/21 1420 6     Pain Loc --      Pain Edu? --      Excl. in [place]? --   TMAX(24)@     _________________________________________ Significant initial  Findings: Abnormal Labs Reviewed  CBC - Abnormal; Notable for the following components:      Result Value   RBC 4.12 (*)    Hemoglobin 12.1 (*)    HCT 37.4 (*)    All other components within normal limits  COMPREHENSIVE METABOLIC PANEL - Abnormal; Notable for the following components:   Creatinine, Ser 1.42 (*)    Total Protein 6.0 (*)    Albumin 3.3 (*)    GFR, Estimated 46 (*)    All other components within normal limits  I-STAT CHEM 8, ED - Abnormal; Notable for the following components:   Creatinine, Ser 1.40 (*)    Calcium, Ion 1.14 (*)    Hemoglobin 11.9 (*)    HCT 35.0 (*)    All other components within normal limits   ____________________________________________ Ordered CT HEAD  Atrophy and mild chronic microvascular ischemic change in the white matter. No acute abnormality MRI 1. No acute intracranial abnormality. 2. Age-related cerebral atrophy with mild to moderate chronic small vessel ischemic disease.   _________________________ Troponin  ordered ECG: Ordered Personally reviewed by me showing: HR :   64 Rhythm:  NSR,    no evidence of ischemic changes QTC 463   The recent clinical data is shown below. Vitals:   04/08/21 1845 04/08/21 1900 04/08/21 2100 04/08/21 2200  BP: (!) 188/69 (!) 173/74 (!) 187/69 (!) 175/87  Pulse: 64 64 66 61  Resp: (!) 27 20 (!) 23 19  Temp:      TempSrc:      SpO2: 95% 94% 96% 90%  Weight:      Height:        WBC     Component Value Date/Time   WBC 6.1 04/08/2021 1432   LYMPHSABS 2.0 04/08/2021 1432   LYMPHSABS 2.0 06/08/2020 0913   MONOABS 0.7 04/08/2021 1432   EOSABS 0.1 04/08/2021 1432   EOSABS 0.1 06/08/2020 0913   BASOSABS 0.0 04/08/2021 1432   BASOSABS 0.0 06/08/2020 0913       UA  ordered  Results for orders placed or performed during the hospital encounter of 04/08/21  Resp Panel by RT-PCR (Flu A&B, Covid) Nasopharyngeal Swab     Status: None   Collection Time: 04/08/21  3:05 PM   Specimen: Nasopharyngeal Swab; Nasopharyngeal(NP) swabs in vial transport medium  Result Value Ref Range Status   SARS Coronavirus 2 by RT PCR NEGATIVE NEGATIVE Final         Influenza A by PCR NEGATIVE NEGATIVE Final   Influenza B by PCR NEGATIVE NEGATIVE Final          _______________________________________________________ ER Provider Called:  Neurology    Dr.Kirkpatrick They Recommend admit to medicine  obtain MRI Re consult in AM if persists _______________________________________________ Hospitalist was called for admission for hypertensive urgency  The following Work up has been ordered so far:  Orders Placed This Encounter  Procedures   Resp Panel by RT-PCR (Flu A&B, Covid) Nasopharyngeal Swab   CT HEAD WO CONTRAST   MR Brain W and Wo Contrast   Ethanol   Protime-INR   APTT   CBC   Differential   Comprehensive metabolic panel   Urine rapid drug screen (hosp performed)  Urinalysis, Routine w reflex microscopic   Sedimentation rate   Diet NPO time specified   Vital signs   Cardiac Monitoring   Modified Stroke Scale (mNIHSS)  Document mNIHSS assessment every 2 hours for a total of 12 hours   Stroke swallow screen   Initiate Carrier Fluid Protocol   If O2 sat If O2 Sat < 94%, administer O2 at 2 liters/minute via nasal cannula.   Consult to neurology   Consult to hospitalist   Consult to Neuro Hospitalist   Pulse oximetry, continuous   I-stat chem 8, ED   EKG 12-Lead   ED EKG   Saline lock IV   Following Medications were ordered in ER: Medications  hydrALAZINE (APRESOLINE) tablet 50 mg (50 mg Oral Given 04/08/21 2142)  metoprolol succinate (TOPROL-XL) 24 hr tablet 25 mg (25 mg Oral Given 04/08/21 1745)  hydrALAZINE (APRESOLINE) injection 5 mg (has no administration in time range)  acetaminophen (TYLENOL) tablet 1,000 mg (has no administration in time range)  sodium chloride 0.9 % bolus 500 mL (0 mLs Intravenous Stopped 04/08/21 1626)  ibuprofen (ADVIL) tablet 400 mg (400 mg Oral Given 04/08/21 2142)  gadobutrol (GADAVIST) 1 MMOL/ML injection 8.5 mL (8.5 mLs Intravenous Contrast Given 04/08/21 2001)        Consult Orders  (From admission, onward)           Start     Ordered   04/08/21 2306  Consult to Neuro Hospitalist  Paged  Once       Provider:  (Not yet assigned)  Question Answer Comment  Place call to: Neuro Hospitalist on call   Reason for Consult Admit      04/08/21 2305   04/08/21 2302  Consult to hospitalist  Paged  Once       Provider:  (Not yet assigned)  Question Answer Comment  Place call to: [facility]   Reason for Consult Admit      04/08/21 2301            OTHER Significant initial  Findings:  labs showing:  Recent Labs  Lab 04/08/21 1432 04/08/21 1438  NA 140 142  K 4.0 4.0  CO2 26  --   GLUCOSE 84 80  BUN 21 23  CREATININE 1.42* 1.40*  CALCIUM 8.9  --     Cr  stable,  Lab Results  Component Value Date   CREATININE 1.40 (H) 04/08/2021   CREATININE 1.42 (H) 04/08/2021   CREATININE 1.46 (H) 04/01/2021    Recent Labs  Lab 04/08/21 1432  AST 20   ALT 14  ALKPHOS 64  BILITOT 0.5  PROT 6.0*  ALBUMIN 3.3*   Lab Results  Component Value Date   CALCIUM 8.9 04/08/2021      Plt: Lab Results  Component Value Date   PLT 163 04/08/2021        Recent Labs  Lab 04/08/21 1432 04/08/21 1438  WBC 6.1  --   NEUTROABS 3.3  --   HGB 12.1* 11.9*  HCT 37.4* 35.0*  MCV 90.8  --   PLT 163  --     HG/HCT stable,    Component Value Date/Time   HGB 11.9 (L) 04/08/2021 1438   HGB 11.7 (L) 06/08/2020 0913   HCT 35.0 (L) 04/08/2021 1438   HCT 34.5 (L) 06/08/2020 0913   MCV 90.8 04/08/2021 1432   MCV 84 06/08/2020 0913  Cultures: No results found for: SDES, SPECREQUEST, CULT, REPTSTATUS   Radiological Exams on Admission: CT  HEAD WO CONTRAST  Result Date: 04/08/2021 CLINICAL DATA:  Acute neuro deficit.  Right-sided headache 2 weeks EXAM: CT HEAD WITHOUT CONTRAST TECHNIQUE: Contiguous axial images were obtained from the base of the skull through the vertex without intravenous contrast. COMPARISON:  CT head 04/02/2021 FINDINGS: Brain: Generalized atrophy. Patchy white matter hypodensity bilaterally unchanged. Negative for acute infarct, hemorrhage, mass Vascular: Negative for hyperdense vessel Skull: Negative Sinuses/Orbits: Negative Other: None IMPRESSION: Atrophy and mild chronic microvascular ischemic change in the white matter. No acute abnormality Electronically Signed   By: Franchot Gallo M.D.   On: 04/08/2021 15:01   MR Brain W and Wo Contrast  Result Date: 04/08/2021 CLINICAL DATA:  Initial evaluation for acute headache. EXAM: MRI HEAD WITHOUT AND WITH CONTRAST TECHNIQUE: Multiplanar, multiecho pulse sequences of the brain and surrounding structures were obtained without and with intravenous contrast. CONTRAST:  8.22mL GADAVIST GADOBUTROL 1 MMOL/ML IV SOLN COMPARISON:  CT from earlier the same day. FINDINGS: Brain: Age-related cerebral atrophy. Patchy T2/FLAIR hyperintensity involving the periventricular deep white matter both  cerebral hemispheres most consistent with chronic small vessel ischemic disease, mild to moderate in nature. Small remote left cerebellar infarct noted. No abnormal foci of restricted diffusion to suggest acute or subacute ischemia. Gray-white matter differentiation otherwise maintained. No encephalomalacia to suggest chronic cortical infarction. No acute or chronic intracranial hemorrhage. No mass lesion, midline shift or mass effect. No hydrocephalus or extra-axial fluid collection. Pituitary gland suprasellar region within normal limits. Midline structures intact and normal. No abnormal enhancement. Vascular: Major intracranial vascular flow voids are maintained. Skull and upper cervical spine: Craniocervical junction within normal limits. Bone marrow signal intensity normal. No scalp soft tissue abnormality. Sinuses/Orbits: Patient status post bilateral ocular lens replacement. Globes orbital soft tissues demonstrate no acute finding. Mild scattered mucosal thickening noted within the sphenoid ethmoidal sinuses. Paranasal sinuses are otherwise clear. No mastoid effusion. Inner ear structures grossly normal. Other: None. IMPRESSION: 1. No acute intracranial abnormality. 2. Age-related cerebral atrophy with mild to moderate chronic small vessel ischemic disease. Electronically Signed   By: Jeannine Boga M.D.   On: 04/08/2021 21:10   _______________________________________________________________________________________________________ Latest  Blood pressure (!) 175/87, pulse 61, temperature 97.9 F (36.6 C), temperature source Oral, resp. rate 19, height 5\' 5"  (1.651 m), weight 85.5 kg, SpO2 90 %.   Review of Systems:    Pertinent positives include: confusion  Constitutional:  No weight loss, night sweats, Fevers, chills, fatigue, weight loss  HEENT:  No headaches, Difficulty swallowing,Tooth/dental problems,Sore throat,  No sneezing, itching, ear ache, nasal congestion, post nasal drip,   Cardio-vascular:  No chest pain, Orthopnea, PND, anasarca, dizziness, palpitations.no Bilateral lower extremity swelling  GI:  No heartburn, indigestion, abdominal pain, nausea, vomiting, diarrhea, change in bowel habits, loss of appetite, melena, blood in stool, hematemesis Resp:  no shortness of breath at rest. No dyspnea on exertion, No excess mucus, no productive cough, No non-productive cough, No coughing up of blood.No change in color of mucus.No wheezing. Skin:  no rash or lesions. No jaundice GU:  no dysuria, change in color of urine, no urgency or frequency. No straining to urinate.  No flank pain.  Musculoskeletal:  No joint pain or no joint swelling. No decreased range of motion. No back pain.  Psych:  No change in mood or affect. No depression or anxiety. No memory loss.  Neuro: no localizing neurological complaints, no tingling, no weakness, no double vision, no gait abnormality, no slurred speech, no   All systems reviewed and  apart from [place] all are negative _______________________________________________________________________________________________ Past Medical History:   Past Medical History:  Diagnosis Date   Carotid arterial disease ([place])    Doppler in 2003 with 40 to 60% L ICA   Carpal tunnel syndrome    Coronary artery disease    s/p CABG & redo CABG with AVR   Hyperlipidemia    Hypertension    Meningitis    Normal echocardiogram Sept 2012   Has normal EF, mild AS and moderate LAE   PAF (paroxysmal atrial fibrillation) [facility]) Sept 2012   converted spontaneously   PUD (peptic ulcer disease)    Remote history   Status post aortic valve replacement with tissue    for aortic stenosis     Past Surgical History:  Procedure Laterality Date   APPENDECTOMY     CARDIAC CATHETERIZATION  04/02/2007   EF 60%/severe two-vessel obstructive coronary artery disease/patent saphenous bein graft to rt coronary/ severe stenosis at the ostium of the lt internal mammary  artery graft to the abtuse marginal branch/normal lt ventricular function/ severe aortic stenosis/normal rt heart pressures   CARDIAC CATHETERIZATION  07/25/2002   EF 65%/two-vessel obstructive atherosclerotic coronary artery disease/ patent saphenous vein graft to the distal rt coronary arter//patent lt internal mammary artery graft to the obtuse marginal branch/normal lt ventricular function   CARDIAC CATHETERIZATION  05/17/1991   EF65%/two-vessel ovstructive atherosclerotic coronary artery disease/good lt ventricular performance/compared with previous catheterization there is now total occlusion of the tr coronary artery at the prior angioplasty site/good collateral flow to the distal rt coronary artery is seen/there is also progressive disease in the lt circumflex vessel now to obstructive levels   CARDIAC CATHETERIZATION  07/26/1990   EF60%/borderline obstructive coronary artery disease in the mid lt circumflex & in the rt coronary artery prior angioplasty site/normal lt ventricular function   CARDIAC CATHETERIZATION  05/23/1990   successful percutaneous transluminal coronary angioplasty of the proximal rt coronary artery   CATARACT EXTRACTION W/ INTRAOCULAR LENS IMPLANT Bilateral    CORONARY ANGIOPLASTY  07/12/1990   single vessel obstructive atherosclerotic coronary atrery disease in the rt corornary artery with restenosis of the primary angioplasty site/successful repeat PTCA of the proximal rt coronary arter   CORONARY ARTERY BYPASS GRAFT     CORONARY ARTERY BYPASS GRAFT     x2 using a reverse saphenous vein graft to posterior descending, reseverse saphenous vein graft to lt internal mammary artery at the previous bypass to the circumflex with a no vein harvesting   GANGLION CYST EXCISION Right    Wrist   HEMORRHOID SURGERY     at age 44   [place]  05/02/1965   PENILE PROSTHESIS IMPLANT  05/03/1987   STERNOTOMY     redo median with aortic valve replacement with a  pericardial tissue valve/[employer] model 3000,91mm,serial numver [id number]   TONSILLECTOMY     at age 55   VASECTOMY     at age 59    Social History:  Ambulatory   independently      reports that he quit smoking about 38 years ago. His smoking use included cigarettes and cigars. He has a 61.50 pack-year smoking history. He has never used smokeless tobacco. He reports that he does not drink alcohol and does not use drugs.     Family History:  Family History  Problem Relation Age of Onset   Cancer Father 60       intestinal   Heart attack Mother 73   Stroke  Sister    ______________________________________________________________________________________________ Allergies: Allergies  Allergen Reactions   Codeine     Hallucinations   Penicillins Hives     Prior to Admission medications   Medication Sig Start Date End Date Taking? Authorizing Provider  aspirin (ASPIRIN CHILDRENS) 81 MG chewable tablet Chew 1 tablet (81 mg total) by mouth daily. 01/19/11  Yes Rosalio Macadamia, NP  atorvastatin (LIPITOR) 40 MG tablet Take 1 tablet (40 mg total) by mouth daily. 06/08/20  Yes [place], Peter M, MD  COMBIGAN 0.2-0.5 % ophthalmic solution Place 1 drop into the left eye every 12 (twelve) hours.  12/06/12  Yes [provider]  furosemide (LASIX) 20 MG tablet Take 1 tablet (20 mg total) by mouth daily. 09/07/20 09/02/21 Yes [place], Peter M, MD  hydrALAZINE (APRESOLINE) 50 MG tablet Take 1 tablet (50 mg total) by mouth 3 (three) times daily. 04/05/21 07/04/21 Yes [place], Peter M, MD  metoprolol succinate (TOPROL XL) 25 MG 24 hr tablet Take 1 tablet (25 mg total) by mouth daily. 06/08/20  Yes [place], Peter M, MD  nitroGLYCERIN (NITROSTAT) 0.4 MG SL tablet Place 1 tablet (0.4 mg total) under the tongue every 5 (five) minutes as needed. Patient taking differently: Place 0.4 mg under the tongue every 5 (five) minutes as needed for chest pain. 07/24/20  Yes [place], Peter M, MD  TRAVATAN Z  0.004 % SOLN ophthalmic solution Place 1 drop into the left eye at bedtime.  12/06/12  Yes [provider]    ___________________________________________________________________________________________________ Physical Exam: Vitals with BMI 04/08/2021 04/08/2021 04/08/2021  Height - - -  Weight - - -  BMI - - -  Systolic 175 187 948  Diastolic 87 69 74  Pulse 61 66 64    1. General:  in No  Acute distress  well -appearing 2. Psychological: Alert and  Oriented 3. Head/ENT:    Dry Mucous Membranes                          Head Non traumatic, neck supple                           Poor Dentition 4. SKIN:  decreased Skin turgor,  Skin clean Dry and intact no rash 5. Heart: Regular rate and rhythm no  Murmur, no Rub or gallop 6. Lungs: Clear to auscultation bilaterally, no wheezes or crackles   7. Abdomen: Soft,  non-tender, Non distended   obese  bowel sounds present 8. Lower extremities: no clubbing, cyanosis, trace edema 9. Neurologically  strength 5 out of 5 in all 4 extremities cranial nerves II through XII intact 10. MSK: Normal range of motion    Chart has been reviewed _______________________________________________________________________  Assessment/Plan 87 y.o. male with medical history significant of HTN, CAD, HLD, p A.fib  Admitted for hypertensive urgency  Present on Admission:  Hypertensive urgency vs TIA -avoid over aggressive f blood pressure drop.  Restarted home medications BP has improved.  Continue to monitor And treat as needed Appreciate neurology input at this point rec improve BP control   CAD - on aspirin statin and BB conitnue   Atrial fibrillation (HCC) -on aspirin and beta-blocker not on anticoagulation given risk of falls and history of PUd   Hyperlipidemia -stable continue home medication  Mild confusion/apraxia MRI negative for CVA neurology is aware and continue to follow.  At this point feels that symptoms more consistent with hypertensive  urgency rather than TIA   Other plan as per orders.  DVT prophylaxis:  SCD    Code Status:   DNR/DNI   as per patient   I had personally discussed CODE STATUS with patient     Family Communication:   Family not at  Bedside    Disposition Plan:       To home once workup is complete and patient is stable   Following barriers for discharge:                                                        Will need consultants to evaluate patient prior to discharge    Would benefit from PT/OT eval prior to DC  Ordered                                       Consults called: neurology is aware  Admission status:  ED Disposition     ED Disposition  Admit   Condition  --   [place]: [place] [[id number]]  Level of Care: Progressive [102]  Admit to Progressive based on following criteria: NEUROLOGICAL AND NEUROSURGICAL complex patients with significant risk of instability, who do not meet ICU criteria, yet require close observation or frequent assessment (< / = every 2 - 4 hours) with medical / nursing intervention.  Admit to Progressive based on following criteria: CARDIOVASCULAR & THORACIC of moderate stability with acute coronary syndrome symptoms/low risk myocardial infarction/hypertensive urgency/arrhythmias/heart failure potentially compromising stability and stable post cardiovascular intervention patients.  May place patient in observation at [facility] or [place] if equivalent level of care is available:: No  Covid Evaluation: Confirmed COVID Negative  Diagnosis: Hypertensive urgency [id number]  Admitting Physician: Toy Baker [[id number]]  Attending Physician: Toy Baker [[id number]]           Obs       Level of care     tele  For 12H     Lab Results  Component Value Date   [place] 04/08/2021     Precautions: admitted as   Covid Negative     PPE: Used by the provider:   N95 eye Goggles,  Gloves     [facility] 04/09/2021, 1:32 AM    [facility]     after 2 AM please page floor coverage PA If 7AM-7PM, please contact the day team taking care of the patient using [email or web address]   Patient was evaluated in the context of the global COVID-19 pandemic, which necessitated consideration that the patient might be at risk for infection with the SARS-CoV-2 virus that causes COVID-19. Institutional protocols and algorithms that pertain to the evaluation of patients at risk for COVID-19 are in a state of rapid change based on information released by regulatory bodies including the CDC and federal and state organizations. These policies and algorithms were followed during the patient's care.

## 2021-04-08 NOTE — ED Provider Notes (Signed)
Care of the patient assumed at signout.  11:30 PM MRI gust with the patient and his son.  He does have mild persistent headache, now requests pain medicine.  Notably, the patient also has persistent hypertension in spite of receiving beta-blocker and hydralazine orally earlier.  Diastolic greater than 100.  Son notes that this has been an increasing issue and spite of physician visits, titration of his medications.  With concern for hypertensive urgency contributing to the patient's episode of discoordination, headache, he will be admitted for monitoring, management.  Additional IV hydralazine provided, Tylenol provided.  I discussed this case again with our neurology colleagues who earlier were involved.  Recommendation is for blood pressure control and if symptoms persist repeat neuro consult as needed.   Gerhard Munch, MD 04/08/21 [phone]

## 2021-04-08 NOTE — ED Notes (Signed)
Patient transported to MRI 

## 2021-04-08 NOTE — ED Provider Notes (Addendum)
[facility] EMERGENCY DEPARTMENT Provider Note   CSN: [id number] Arrival date & time: 04/08/21  1401     History Chief Complaint  Patient presents with   Stroke Symptoms    [name] is a 87 y.o. male.  HPI 87 year old male presents with headache.  He states the headache has been ongoing for about 2 weeks.  There pretty much is always some degree of headache but at times it seems to become sudden and severe.  This happens without warning and last several minutes.  The headache is pretty much right-sided in the top of his head.  Sometimes goes into his neck.  He denies fever.  Today his vision has been blurry in both eyes.  He has glaucoma in the left but can see.  He states right now the headache is about a 5 and is hard to describe the quality of the headache.  No vomiting or focal weakness though he has been feeling weaker in general.  Today, he became concerned because he has been crocheting since he was a child and he could not remember how to do it.  He did not have any other difficulty with daily tasks.  He denies any speech difficulty.  He denies any other symptoms such as chest pain.  Past Medical History:  Diagnosis Date   Carotid arterial disease ([place])    Doppler in 2003 with 40 to 60% L ICA   Carpal tunnel syndrome    Coronary artery disease    s/p CABG & redo CABG with AVR   Hyperlipidemia    Hypertension    Meningitis    Normal echocardiogram Sept 2012   Has normal EF, mild AS and moderate LAE   PAF (paroxysmal atrial fibrillation) ([place]) Sept 2012   converted spontaneously   PUD (peptic ulcer disease)    Remote history   Status post aortic valve replacement with tissue    for aortic stenosis    Patient Active Problem List   Diagnosis Date Noted   Glaucoma 11/30/2020   Carotid artery disease ([place]) 11/12/2013   Claudication ([place]) 12/12/2012   Peripheral vascular disease ([place]) 12/12/2012   Carotid bruit 08/23/2011   Atrial fibrillation  with RVR ([place]) 01/12/2011   Essential hypertension    Hyperlipidemia    Coronary artery disease    Status post aortic valve replacement with tissue     Past Surgical History:  Procedure Laterality Date   APPENDECTOMY     CARDIAC CATHETERIZATION  04/02/2007   EF 60%/severe two-vessel obstructive coronary artery disease/patent saphenous bein graft to rt coronary/ severe stenosis at the ostium of the lt internal mammary artery graft to the abtuse marginal branch/normal lt ventricular function/ severe aortic stenosis/normal rt heart pressures   CARDIAC CATHETERIZATION  07/25/2002   EF 65%/two-vessel obstructive atherosclerotic coronary artery disease/ patent saphenous vein graft to the distal rt coronary arter//patent lt internal mammary artery graft to the obtuse marginal branch/normal lt ventricular function   CARDIAC CATHETERIZATION  05/17/1991   EF65%/two-vessel ovstructive atherosclerotic coronary artery disease/good lt ventricular performance/compared with previous catheterization there is now total occlusion of the tr coronary artery at the prior angioplasty site/good collateral flow to the distal rt coronary artery is seen/there is also progressive disease in the lt circumflex vessel now to obstructive levels   CARDIAC CATHETERIZATION  07/26/1990   EF60%/borderline obstructive coronary artery disease in the mid lt circumflex & in the rt coronary artery prior angioplasty site/normal lt ventricular function   CARDIAC  CATHETERIZATION  05/23/1990   successful percutaneous transluminal coronary angioplasty of the proximal rt coronary artery   CATARACT EXTRACTION W/ INTRAOCULAR LENS IMPLANT Bilateral    CORONARY ANGIOPLASTY  07/12/1990   single vessel obstructive atherosclerotic coronary atrery disease in the rt corornary artery with restenosis of the primary angioplasty site/successful repeat PTCA of the proximal rt coronary arter   CORONARY ARTERY BYPASS GRAFT     CORONARY ARTERY BYPASS GRAFT      x2 using a reverse saphenous vein graft to posterior descending, reseverse saphenous vein graft to lt internal mammary artery at the previous bypass to the circumflex with a no vein harvesting   GANGLION CYST EXCISION Right    Wrist   HEMORRHOID SURGERY     at age 87   INGUINAL HERNIA REPAIR  05/02/1965   PENILE PROSTHESIS IMPLANT  05/03/1987   STERNOTOMY     redo median with aortic valve replacement with a pericardial tissue valve/[employer] model 3000,29mm,serial numver [id number]   TONSILLECTOMY     at age 26   VASECTOMY     at age 33       Family History  Problem Relation Age of Onset   Cancer Father 78       intestinal   Heart attack Mother 58   Stroke Sister     Social History   Tobacco Use   Smoking status: Former    Packs/day: 1.50    Years: 41.00    Pack years: 61.50    Types: Cigarettes, Cigars    Quit date: 05/02/1982    Years since quitting: 38.9   Smokeless tobacco: Never  Substance Use Topics   Alcohol use: No   Drug use: No    Home Medications Prior to Admission medications   Medication Sig Start Date End Date Taking? Authorizing Provider  aspirin (ASPIRIN CHILDRENS) 81 MG chewable tablet Chew 1 tablet (81 mg total) by mouth daily. 01/19/11  Yes Rosalio Macadamia, NP  atorvastatin (LIPITOR) 40 MG tablet Take 1 tablet (40 mg total) by mouth daily. 06/08/20  Yes [place], Peter M, MD  COMBIGAN 0.2-0.5 % ophthalmic solution Place 1 drop into the left eye every 12 (twelve) hours.  12/06/12  Yes [provider]  furosemide (LASIX) 20 MG tablet Take 1 tablet (20 mg total) by mouth daily. 09/07/20 09/02/21 Yes [place], Peter M, MD  hydrALAZINE (APRESOLINE) 50 MG tablet Take 1 tablet (50 mg total) by mouth 3 (three) times daily. 04/05/21 07/04/21 Yes [place], Peter M, MD  metoprolol succinate (TOPROL XL) 25 MG 24 hr tablet Take 1 tablet (25 mg total) by mouth daily. 06/08/20  Yes [place], Peter M, MD  nitroGLYCERIN (NITROSTAT) 0.4 MG SL tablet Place 1 tablet  (0.4 mg total) under the tongue every 5 (five) minutes as needed. Patient taking differently: Place 0.4 mg under the tongue every 5 (five) minutes as needed for chest pain. 07/24/20  Yes [place], Peter M, MD  TRAVATAN Z 0.004 % SOLN ophthalmic solution Place 1 drop into the left eye at bedtime.  12/06/12  Yes [provider]    Allergies    Codeine and Penicillins  Review of Systems   Review of Systems  Eyes:  Positive for visual disturbance.  Respiratory:  Negative for shortness of breath.   Cardiovascular:  Negative for chest pain.  Gastrointestinal:  Negative for vomiting.  Musculoskeletal:  Positive for neck pain.  Neurological:  Positive for weakness and headaches. Negative for dizziness, speech difficulty and numbness.  All other systems reviewed and are negative.  Physical Exam Updated Vital Signs BP (!) 195/67   Pulse 60   Temp 97.9 F (36.6 C) (Oral)   Resp 15   Ht 5\' 5"  (1.651 m)   Wt 85.5 kg   SpO2 97%   BMI 31.37 kg/m   Physical Exam Vitals and nursing note reviewed.  Constitutional:      General: He is not in acute distress.    Appearance: He is well-developed. He is not ill-appearing or diaphoretic.  HENT:     Head: Normocephalic and atraumatic.     Comments: No temporal tenderness, especially on the right.    Right Ear: External ear normal.     Left Ear: External ear normal.     Nose: Nose normal.  Eyes:     General:        Right eye: No discharge.        Left eye: No discharge.     Extraocular Movements: Extraocular movements intact.     Comments: Chronic left eye changes with mildly bigger pupil on the left compared to the right.  Cardiovascular:     Rate and Rhythm: Normal rate and regular rhythm.     Heart sounds: Normal heart sounds.  Pulmonary:     Effort: Pulmonary effort is normal.     Breath sounds: Normal breath sounds.  Abdominal:     Palpations: Abdomen is soft.     Tenderness: There is no abdominal tenderness.  Musculoskeletal:      Cervical back: Neck supple. No rigidity.  Skin:    General: Skin is warm and dry.  Neurological:     Mental Status: He is alert.     Comments: CN 3-12 grossly intact. 5/5 strength in all 4 extremities. Grossly normal sensation. Normal finger to nose.  Is easily able to identify objects such as a watch and pen.  Psychiatric:        Mood and Affect: Mood is not anxious.    ED Results / Procedures / Treatments   Labs (all labs ordered are listed, but only abnormal results are displayed) Labs Reviewed  CBC - Abnormal; Notable for the following components:      Result Value   RBC 4.12 (*)    Hemoglobin 12.1 (*)    HCT 37.4 (*)    All other components within normal limits  COMPREHENSIVE METABOLIC PANEL - Abnormal; Notable for the following components:   Creatinine, Ser 1.42 (*)    Total Protein 6.0 (*)    Albumin 3.3 (*)    GFR, Estimated 46 (*)    All other components within normal limits  I-STAT CHEM 8, ED - Abnormal; Notable for the following components:   Creatinine, Ser 1.40 (*)    Calcium, Ion 1.14 (*)    Hemoglobin 11.9 (*)    HCT 35.0 (*)    All other components within normal limits  RESP PANEL BY RT-PCR (FLU A&B, COVID) ARPGX2  PROTIME-INR  APTT  DIFFERENTIAL  ETHANOL  RAPID URINE DRUG SCREEN, HOSP PERFORMED  URINALYSIS, ROUTINE W REFLEX MICROSCOPIC  SEDIMENTATION RATE    EKG EKG Interpretation  Date/Time:  Thursday April 08 2021 14:14:53 EST Ventricular Rate:  64 PR Interval:  216 QRS Duration: 107 QT Interval:  448 QTC Calculation: 463 R Axis:   17 Text Interpretation: Sinus rhythm Borderline prolonged PR interval overall similar to Apr 01 2021 Confirmed by Sherwood Gambler [phone]) on 04/08/2021 2:20:06 PM  Radiology CT HEAD WO  CONTRAST  Result Date: 04/08/2021 CLINICAL DATA:  Acute neuro deficit.  Right-sided headache 2 weeks EXAM: CT HEAD WITHOUT CONTRAST TECHNIQUE: Contiguous axial images were obtained from the base of the skull through the vertex  without intravenous contrast. COMPARISON:  CT head 04/02/2021 FINDINGS: Brain: Generalized atrophy. Patchy white matter hypodensity bilaterally unchanged. Negative for acute infarct, hemorrhage, mass Vascular: Negative for hyperdense vessel Skull: Negative Sinuses/Orbits: Negative Other: None IMPRESSION: Atrophy and mild chronic microvascular ischemic change in the white matter. No acute abnormality Electronically Signed   By: Franchot Gallo M.D.   On: 04/08/2021 15:01    Procedures Procedures   Medications Ordered in ED Medications  sodium chloride 0.9 % bolus 500 mL (500 mLs Intravenous New Bag/Given 04/08/21 1519)    ED Course  I have reviewed the triage vital signs and the nursing notes.  Pertinent labs & imaging results that were available during my care of the patient were reviewed by me and considered in my medical decision making (see chart for details).    MDM Rules/Calculators/A&P                           Initial work-up including head CT is unremarkable.  He has been hypertensive here though is actually improving.  Discussed with neurology given his difficulty with tasks and blurry vision and we will do MRI with and without contrast.  Patient states he has tolerated MRIs in the past.  Early differential includes PRES and stroke.  I have considered but think infection such as meningitis/encephalitis is less likely. Care to Dr. Vanita Panda.  He has declined medications for headache.  Final Clinical Impression(s) / ED Diagnoses Final diagnoses:  None    Rx / DC Orders ED Discharge Orders     None        Sherwood Gambler, MD 04/08/21 1551    Sherwood Gambler, MD 04/08/21 1551

## 2021-04-09 ENCOUNTER — Observation Stay (HOSPITAL_BASED_OUTPATIENT_CLINIC_OR_DEPARTMENT_OTHER): Payer: PPO

## 2021-04-09 ENCOUNTER — Observation Stay (HOSPITAL_COMMUNITY): Payer: PPO

## 2021-04-09 ENCOUNTER — Encounter (HOSPITAL_COMMUNITY): Payer: Self-pay | Admitting: Internal Medicine

## 2021-04-09 DIAGNOSIS — N1832 Chronic kidney disease, stage 3b: Secondary | ICD-10-CM | POA: Diagnosis present

## 2021-04-09 DIAGNOSIS — I480 Paroxysmal atrial fibrillation: Secondary | ICD-10-CM | POA: Diagnosis not present

## 2021-04-09 DIAGNOSIS — Z952 Presence of prosthetic heart valve: Secondary | ICD-10-CM | POA: Diagnosis not present

## 2021-04-09 DIAGNOSIS — N1831 Chronic kidney disease, stage 3a: Secondary | ICD-10-CM | POA: Diagnosis not present

## 2021-04-09 DIAGNOSIS — I5189 Other ill-defined heart diseases: Secondary | ICD-10-CM | POA: Diagnosis not present

## 2021-04-09 DIAGNOSIS — I517 Cardiomegaly: Secondary | ICD-10-CM | POA: Diagnosis not present

## 2021-04-09 DIAGNOSIS — I161 Hypertensive emergency: Secondary | ICD-10-CM

## 2021-04-09 DIAGNOSIS — I25119 Atherosclerotic heart disease of native coronary artery with unspecified angina pectoris: Secondary | ICD-10-CM | POA: Diagnosis not present

## 2021-04-09 DIAGNOSIS — E782 Mixed hyperlipidemia: Secondary | ICD-10-CM | POA: Diagnosis not present

## 2021-04-09 DIAGNOSIS — I2510 Atherosclerotic heart disease of native coronary artery without angina pectoris: Secondary | ICD-10-CM | POA: Diagnosis not present

## 2021-04-09 DIAGNOSIS — R519 Headache, unspecified: Secondary | ICD-10-CM | POA: Diagnosis not present

## 2021-04-09 DIAGNOSIS — I160 Hypertensive urgency: Secondary | ICD-10-CM | POA: Diagnosis not present

## 2021-04-09 DIAGNOSIS — N183 Chronic kidney disease, stage 3 unspecified: Secondary | ICD-10-CM | POA: Diagnosis present

## 2021-04-09 LAB — COMPREHENSIVE METABOLIC PANEL
ALT: 16 U/L (ref 0–44)
AST: 18 U/L (ref 15–41)
Albumin: 3 g/dL — ABNORMAL LOW (ref 3.5–5.0)
Alkaline Phosphatase: 55 U/L (ref 38–126)
Anion gap: 7 (ref 5–15)
BUN: 22 mg/dL (ref 8–23)
CO2: 25 mmol/L (ref 22–32)
Calcium: 8.5 mg/dL — ABNORMAL LOW (ref 8.9–10.3)
Chloride: 108 mmol/L (ref 98–111)
Creatinine, Ser: 1.34 mg/dL — ABNORMAL HIGH (ref 0.61–1.24)
GFR, Estimated: 49 mL/min — ABNORMAL LOW (ref >60)
Glucose, Bld: 96 mg/dL (ref 70–99)
Potassium: 4 mmol/L (ref 3.5–5.1)
Sodium: 140 mmol/L (ref 135–145)
Total Bilirubin: 0.8 mg/dL (ref 0.3–1.2)
Total Protein: 5.8 g/dL — ABNORMAL LOW (ref 6.5–8.1)

## 2021-04-09 LAB — LIPID PANEL
Cholesterol: 150 mg/dL (ref 0–200)
HDL: 39 mg/dL — ABNORMAL LOW (ref >40)
LDL Cholesterol: 77 mg/dL (ref 0–99)
Total CHOL/HDL Ratio: 3.8 RATIO
Triglycerides: 170 mg/dL — ABNORMAL HIGH (ref <150)
VLDL: 34 mg/dL (ref 0–40)

## 2021-04-09 LAB — ECHOCARDIOGRAM COMPLETE
AR max vel: 1.3 cm2
AV Area VTI: 1.35 cm2
AV Area mean vel: 1.31 cm2
AV Mean grad: 12.5 mmHg
AV Peak grad: 23.5 mmHg
Ao pk vel: 2.43 m/s
Area-P 1/2: 3.42 cm2
Calc EF: 52.7 %
Height: 65 in
S' Lateral: 3.9 cm
Single Plane A2C EF: 53.4 %
Single Plane A4C EF: 51.9 %
Weight: 3015.89 oz

## 2021-04-09 LAB — CBC WITH DIFFERENTIAL/PLATELET
Abs Immature Granulocytes: 0.02 10*3/uL (ref 0.00–0.07)
Basophils Absolute: 0 10*3/uL (ref 0.0–0.1)
Basophils Relative: 0 %
Eosinophils Absolute: 0.1 10*3/uL (ref 0.0–0.5)
Eosinophils Relative: 2 %
HCT: 35.5 % — ABNORMAL LOW (ref 39.0–52.0)
Hemoglobin: 11.6 g/dL — ABNORMAL LOW (ref 13.0–17.0)
Immature Granulocytes: 0 %
Lymphocytes Relative: 31 %
Lymphs Abs: 2 10*3/uL (ref 0.7–4.0)
MCH: 29.1 pg (ref 26.0–34.0)
MCHC: 32.7 g/dL (ref 30.0–36.0)
MCV: 89.2 fL (ref 80.0–100.0)
Monocytes Absolute: 0.6 10*3/uL (ref 0.1–1.0)
Monocytes Relative: 9 %
Neutro Abs: 3.6 10*3/uL (ref 1.7–7.7)
Neutrophils Relative %: 58 %
Platelets: 162 10*3/uL (ref 150–400)
RBC: 3.98 MIL/uL — ABNORMAL LOW (ref 4.22–5.81)
RDW: 14.9 % (ref 11.5–15.5)
WBC: 6.3 10*3/uL (ref 4.0–10.5)
nRBC: 0 % (ref 0.0–0.2)

## 2021-04-09 LAB — MAGNESIUM: Magnesium: 2 mg/dL (ref 1.7–2.4)

## 2021-04-09 LAB — TROPONIN I (HIGH SENSITIVITY): Troponin I (High Sensitivity): 28 ng/L — ABNORMAL HIGH (ref <18)

## 2021-04-09 LAB — T4, FREE: Free T4: 0.87 ng/dL (ref 0.61–1.12)

## 2021-04-09 LAB — PHOSPHORUS: Phosphorus: 3.4 mg/dL (ref 2.5–4.6)

## 2021-04-09 LAB — BRAIN NATRIURETIC PEPTIDE: B Natriuretic Peptide: 448.2 pg/mL — ABNORMAL HIGH (ref 0.0–100.0)

## 2021-04-09 LAB — TSH: TSH: 5.903 u[IU]/mL — ABNORMAL HIGH (ref 0.350–4.500)

## 2021-04-09 MED ORDER — HYDROCODONE-ACETAMINOPHEN 5-325 MG PO TABS
1.0000 | ORAL_TABLET | ORAL | Status: DC | PRN
  Administered 2021-04-10: 1 via ORAL
  Filled 2021-04-09: qty 1

## 2021-04-09 MED ORDER — ASPIRIN 81 MG PO CHEW
81.0000 mg | CHEWABLE_TABLET | Freq: Every day | ORAL | Status: DC
  Administered 2021-04-09 – 2021-04-12 (×4): 81 mg via ORAL
  Filled 2021-04-09 (×4): qty 1

## 2021-04-09 MED ORDER — AMLODIPINE BESYLATE 2.5 MG PO TABS
2.5000 mg | ORAL_TABLET | Freq: Every day | ORAL | Status: DC
  Administered 2021-04-09 – 2021-04-12 (×4): 2.5 mg via ORAL
  Filled 2021-04-09 (×4): qty 1

## 2021-04-09 MED ORDER — HYDRALAZINE HCL 50 MG PO TABS
100.0000 mg | ORAL_TABLET | Freq: Three times a day (TID) | ORAL | Status: DC
  Administered 2021-04-09 – 2021-04-11 (×7): 100 mg via ORAL
  Filled 2021-04-09 (×2): qty 2
  Filled 2021-04-09: qty 4
  Filled 2021-04-09 (×4): qty 2

## 2021-04-09 MED ORDER — BRIMONIDINE TARTRATE 0.2 % OP SOLN
1.0000 [drp] | Freq: Two times a day (BID) | OPHTHALMIC | Status: DC
  Administered 2021-04-10 – 2021-04-12 (×5): 1 [drp] via OPHTHALMIC
  Filled 2021-04-09 (×3): qty 5

## 2021-04-09 MED ORDER — ACETAMINOPHEN 325 MG PO TABS
650.0000 mg | ORAL_TABLET | Freq: Four times a day (QID) | ORAL | Status: DC | PRN
  Administered 2021-04-10: 650 mg via ORAL
  Filled 2021-04-09: qty 2

## 2021-04-09 MED ORDER — CLONIDINE HCL 0.1 MG PO TABS: 0.1000 mg | ORAL_TABLET | Freq: Two times a day (BID) | ORAL | Status: DC | PRN

## 2021-04-09 MED ORDER — FUROSEMIDE 20 MG PO TABS
20.0000 mg | ORAL_TABLET | Freq: Every day | ORAL | Status: DC
  Administered 2021-04-09 – 2021-04-12 (×4): 20 mg via ORAL
  Filled 2021-04-09 (×4): qty 1

## 2021-04-09 MED ORDER — SODIUM CHLORIDE 0.9 % IV SOLN: 250.0000 mL | INTRAVENOUS | Status: DC | PRN

## 2021-04-09 MED ORDER — BRIMONIDINE TARTRATE-TIMOLOL 0.2-0.5 % OP SOLN
1.0000 [drp] | Freq: Two times a day (BID) | OPHTHALMIC | Status: DC
Start: 2021-04-09 — End: 2021-04-09
  Filled 2021-04-09: qty 5

## 2021-04-09 MED ORDER — ATORVASTATIN CALCIUM 40 MG PO TABS
40.0000 mg | ORAL_TABLET | Freq: Every day | ORAL | Status: DC
  Administered 2021-04-09 – 2021-04-12 (×4): 40 mg via ORAL
  Filled 2021-04-09 (×4): qty 1

## 2021-04-09 MED ORDER — TIMOLOL MALEATE 0.5 % OP SOLN
1.0000 [drp] | Freq: Two times a day (BID) | OPHTHALMIC | Status: DC
  Administered 2021-04-09 – 2021-04-12 (×7): 1 [drp] via OPHTHALMIC
  Filled 2021-04-09 (×2): qty 5

## 2021-04-09 MED ORDER — ACETAMINOPHEN 650 MG RE SUPP: 650.0000 mg | Freq: Four times a day (QID) | RECTAL | Status: DC | PRN

## 2021-04-09 MED ORDER — LATANOPROST 0.005 % OP SOLN
1.0000 [drp] | Freq: Every day | OPHTHALMIC | Status: DC
  Administered 2021-04-09 – 2021-04-11 (×3): 1 [drp] via OPHTHALMIC
  Filled 2021-04-09 (×2): qty 2.5

## 2021-04-09 MED ORDER — SODIUM CHLORIDE 0.9% FLUSH: 3.0000 mL | INTRAVENOUS | Status: DC | PRN

## 2021-04-09 MED ORDER — SODIUM CHLORIDE 0.9% FLUSH
3.0000 mL | Freq: Two times a day (BID) | INTRAVENOUS | Status: DC
  Administered 2021-04-09 – 2021-04-12 (×8): 3 mL via INTRAVENOUS

## 2021-04-09 NOTE — Consult Note (Addendum)
Cardiology Consultation:   Patient ID: [name] MRN: [id number]; DOB: 1924/06/07  Admit date: 04/08/2021 Date of Consult: 04/09/2021  PCP:  Haydee Salter, MD   [facility] Providers Cardiologist:  Peter Martinique, MD   Patient Profile:   [name] is a 87 y.o. male with a hx of CAD status post redo CABG, AS status post AVR in 2008, mild bilateral ICA stenosis, hypertension, hyperlipidemia, PAF and PVD who is being seen 04/09/2021 for the evaluation of hypertensive urgency at the request of Dr. Bonner Puna.  History of Present Illness:   [name] has a history of CAD and is status post redo CABG and AVR in 2008.  He has bilateral ICA stenosis by carotid artery Dopplers.  He had PAF in September 2012 but converted spontaneously.  Echocardiogram in 2012 showed normal LVEF and mild aortic stenosis.  He had a nonischemic Myoview in May 2013.  LE arterial Dopplers in August 2016 with mild vascular disease and good ABIs.  He was last seen by Dr. Martinique 12/21/2020 and reported symptoms of transient dizziness.  He has a cardio mobile device.  He was seen in the ER on 04/02/2021 with hypertension and right-sided headache.  He was 192/77 on arrival.  Through discussion with patient and family, he was not taking hydralazine as prescribed.  CT head was negative for acute findings.  Medication adherence discussed and he was discharged without admission.  He presented back to the ER 04/08/2021 with headache x2 weeks and blurry vision.  He also had some confusion and was unsure how to do tasks that previously he knew how to do.  Head CT was negative.  MRI brain with age-related changes and mild to moderate chronic small vessel ischemic disease.  CXR with cardiomegaly.    He has not tolerated Norvasc (LE swelling) or  ACEI (hyperkalemia) in the past. Sounds like he has had compliance issues with 3 times daily hydralazine. However, in discussion with his son, he just forgot his hydralazine a few times a  couple of weeks ago.  Pt denies chest pain, dyspnea, and recent syncope. His main complaint is his ongoing headache.   Past Medical History:  Diagnosis Date   Carotid arterial disease ([place])    Doppler in 2003 with 40 to 60% L ICA   Carpal tunnel syndrome    Coronary artery disease    s/p CABG & redo CABG with AVR   Hyperlipidemia    Hypertension    Meningitis    Normal echocardiogram Sept 2012   Has normal EF, mild AS and moderate LAE   PAF (paroxysmal atrial fibrillation) [facility]) Sept 2012   converted spontaneously   PUD (peptic ulcer disease)    Remote history   Status post aortic valve replacement with tissue    for aortic stenosis    Past Surgical History:  Procedure Laterality Date   APPENDECTOMY     CARDIAC CATHETERIZATION  04/02/2007   EF 60%/severe two-vessel obstructive coronary artery disease/patent saphenous bein graft to rt coronary/ severe stenosis at the ostium of the lt internal mammary artery graft to the abtuse marginal branch/normal lt ventricular function/ severe aortic stenosis/normal rt heart pressures   CARDIAC CATHETERIZATION  07/25/2002   EF 65%/two-vessel obstructive atherosclerotic coronary artery disease/ patent saphenous vein graft to the distal rt coronary arter//patent lt internal mammary artery graft to the obtuse marginal branch/normal lt ventricular function   CARDIAC CATHETERIZATION  05/17/1991   EF65%/two-vessel ovstructive atherosclerotic coronary artery disease/good lt ventricular performance/compared  with previous catheterization there is now total occlusion of the tr coronary artery at the prior angioplasty site/good collateral flow to the distal rt coronary artery is seen/there is also progressive disease in the lt circumflex vessel now to obstructive levels   CARDIAC CATHETERIZATION  07/26/1990   EF60%/borderline obstructive coronary artery disease in the mid lt circumflex & in the rt coronary artery prior angioplasty site/normal lt ventricular  function   CARDIAC CATHETERIZATION  05/23/1990   successful percutaneous transluminal coronary angioplasty of the proximal rt coronary artery   CATARACT EXTRACTION W/ INTRAOCULAR LENS IMPLANT Bilateral    CORONARY ANGIOPLASTY  07/12/1990   single vessel obstructive atherosclerotic coronary atrery disease in the rt corornary artery with restenosis of the primary angioplasty site/successful repeat PTCA of the proximal rt coronary arter   CORONARY ARTERY BYPASS GRAFT     CORONARY ARTERY BYPASS GRAFT     x2 using a reverse saphenous vein graft to posterior descending, reseverse saphenous vein graft to lt internal mammary artery at the previous bypass to the circumflex with a no vein harvesting   GANGLION CYST EXCISION Right    Wrist   HEMORRHOID SURGERY     at age 30   INGUINAL HERNIA REPAIR  05/02/1965   PENILE PROSTHESIS IMPLANT  05/03/1987   STERNOTOMY     redo median with aortic valve replacement with a pericardial tissue valve/[employer] model 3000,37mm,serial numver [id number]   TONSILLECTOMY     at age 18   VASECTOMY     at age 3     Home Medications:  Prior to Admission medications   Medication Sig Start Date End Date Taking? Authorizing Provider  aspirin (ASPIRIN CHILDRENS) 81 MG chewable tablet Chew 1 tablet (81 mg total) by mouth daily. 01/19/11  Yes Rosalio Macadamia, NP  atorvastatin (LIPITOR) 40 MG tablet Take 1 tablet (40 mg total) by mouth daily. 06/08/20  Yes [place], Peter M, MD  COMBIGAN 0.2-0.5 % ophthalmic solution Place 1 drop into the left eye every 12 (twelve) hours.  12/06/12  Yes [provider]  furosemide (LASIX) 20 MG tablet Take 1 tablet (20 mg total) by mouth daily. 09/07/20 09/02/21 Yes [place], Peter M, MD  hydrALAZINE (APRESOLINE) 50 MG tablet Take 1 tablet (50 mg total) by mouth 3 (three) times daily. 04/05/21 07/04/21 Yes [place], Peter M, MD  metoprolol succinate (TOPROL XL) 25 MG 24 hr tablet Take 1 tablet (25 mg total) by mouth daily. 06/08/20  Yes  [place], Peter M, MD  nitroGLYCERIN (NITROSTAT) 0.4 MG SL tablet Place 1 tablet (0.4 mg total) under the tongue every 5 (five) minutes as needed. Patient taking differently: Place 0.4 mg under the tongue every 5 (five) minutes as needed for chest pain. 07/24/20  Yes [place], Peter M, MD  TRAVATAN Z 0.004 % SOLN ophthalmic solution Place 1 drop into the left eye at bedtime.  12/06/12  Yes [provider]    Inpatient Medications: Scheduled Meds:  acetaminophen  1,000 mg Oral Once   aspirin  81 mg Oral Daily   atorvastatin  40 mg Oral Daily   brimonidine  1 drop Left Eye BID   And   timolol  1 drop Left Eye BID   furosemide  20 mg Oral Daily   hydrALAZINE  100 mg Oral TID   latanoprost  1 drop Left Eye QHS   metoprolol succinate  25 mg Oral Daily   sodium chloride flush  3 mL Intravenous Q12H   Continuous Infusions:  sodium chloride     PRN Meds: sodium chloride, acetaminophen **OR** acetaminophen, cloNIDine, HYDROcodone-acetaminophen, sodium chloride flush  Allergies:    Allergies  Allergen Reactions   Codeine     Hallucinations   Penicillins Hives    Social History:   Social History   Socioeconomic History   Marital status: Widowed    Spouse name: Not on file   Number of children: 3   Years of education: Not on file   Highest education level: Not on file  Occupational History   Occupation: city of [employer]     Employer: RETIRED    Comment: retired   Occupation: Presenter, broadcasting  Tobacco Use   Smoking status: Former    Packs/day: 1.50    Years: 41.00    Pack years: 61.50    Types: Cigarettes, Cigars    Quit date: 05/02/1982    Years since quitting: 38.9   Smokeless tobacco: Never  Substance and Sexual Activity   Alcohol use: No   Drug use: No   Sexual activity: Not on file  Other Topics Concern   Not on file  Social History Narrative   Lives alone.  Lives near daughter.    Social Determinants of Health   Financial Resource Strain: Not on file   Food Insecurity: Not on file  Transportation Needs: Not on file  Physical Activity: Not on file  Stress: Not on file  Social Connections: Not on file  Intimate Partner Violence: Not on file    Family History:    Family History  Problem Relation Age of Onset   Cancer Father 10       intestinal   Heart attack Mother 71   Stroke Sister      ROS:  Please see the history of present illness.   All other ROS reviewed and negative.     Physical Exam/Data:   Vitals:   04/09/21 1145 04/09/21 1150 04/09/21 1315 04/09/21 1551  BP: (!) 174/71 130/62  (!) 180/70  Pulse: 60 64 68 66  Resp: 12 (!) 22 16 18   Temp:    98 F (36.7 C)  TempSrc:    Oral  SpO2: 95% 96% 95% 98%  Weight:    83.5 kg  Height:    5\' 5"  (1.651 m)   No intake or output data in the 24 hours ending 04/09/21 1619 Last 3 Weights 04/09/2021 04/08/2021 03/18/2021  Weight (lbs) 184 lb 188 lb 7.9 oz 188 lb 9.6 oz  Weight (kg) 83.462 kg 85.5 kg 85.548 kg     Body mass index is 30.62 kg/m.  General:  elderly male in NAD HEENT: normal Neck: no JVD Vascular: No carotid bruits; Distal pulses 2+ bilaterally Cardiac:  normal S1, S2; RRR; no murmur  Lungs:  clear to auscultation bilaterally, no wheezing, rhonchi or rales  Abd: soft, nontender, no hepatomegaly  Ext: minimal LE edema Musculoskeletal:  No deformities, BUE and BLE strength normal and equal Skin: warm and dry  Neuro:  CNs 2-12 intact, no focal abnormalities noted Psych:  Normal affect   EKG:  The EKG was personally reviewed and demonstrates: Sinus rhythm heart rate 64, incomplete LBBB Telemetry:  Telemetry was personally reviewed and demonstrates:  sinus rhythm with HR 60s, PVCs, NSVT  Relevant CV Studies:  Echocardiogram 04/09/2021  1. Left ventricular ejection fraction, by estimation, is 50 to 55%. The  left ventricle has low normal function. The left ventricle has no regional  wall motion abnormalities. There is mild left  ventricular hypertrophy.   Left ventricular diastolic  parameters are consistent with Grade I diastolic dysfunction (impaired  relaxation).   2. Right ventricular systolic function is mildly reduced. The right  ventricular size is normal. There is normal pulmonary artery systolic  pressure.   3. Left atrial size was severely dilated.   4. Right atrial size was moderately dilated.   5. The mitral valve is grossly normal. Mild mitral valve regurgitation.   6. Prosthetic valve is well seated, no paravalvular leak. AV max 2.4 m/s.  The aortic valve is grossly normal. Aortic valve regurgitation is not  visualized. No aortic stenosis is present. There is a 23 mm Edwards bovine  valve present in the aortic  position. Procedure Date: 2009.   7. The inferior vena cava is normal in size with greater than 50%  respiratory variability, suggesting right atrial pressure of 3 mmHg.   Laboratory Data:  High Sensitivity Troponin:   Recent Labs  Lab 04/09/21 0353  TROPONINIHS 28*     Chemistry Recent Labs  Lab 04/08/21 1432 04/08/21 1438 04/09/21 0353  NA 140 142 140  K 4.0 4.0 4.0  CL 107 107 108  CO2 26  --  25  GLUCOSE 84 80 96  BUN 21 23 22   CREATININE 1.42* 1.40* 1.34*  CALCIUM 8.9  --  8.5*  MG  --   --  2.0  GFRNONAA 46*  --  49*  ANIONGAP 7  --  7    Recent Labs  Lab 04/08/21 1432 04/09/21 0353  PROT 6.0* 5.8*  ALBUMIN 3.3* 3.0*  AST 20 18  ALT 14 16  ALKPHOS 64 55  BILITOT 0.5 0.8   Lipids  Recent Labs  Lab 04/09/21 0353  CHOL 150  TRIG 170*  HDL 39*  LDLCALC 77  CHOLHDL 3.8    Hematology Recent Labs  Lab 04/08/21 1432 04/08/21 1438 04/09/21 0353  WBC 6.1  --  6.3  RBC 4.12*  --  3.98*  HGB 12.1* 11.9* 11.6*  HCT 37.4* 35.0* 35.5*  MCV 90.8  --  89.2  MCH 29.4  --  29.1  MCHC 32.4  --  32.7  RDW 14.9  --  14.9  PLT 163  --  162   Thyroid  Recent Labs  Lab 04/09/21 0353  TSH 5.903*  FREET4 0.87    BNP Recent Labs  Lab 04/09/21 0353  BNP 448.2*    DDimer No  results for input(s): DDIMER in the last 168 hours.   Radiology/Studies:  CT HEAD WO CONTRAST  Result Date: 04/08/2021 CLINICAL DATA:  Acute neuro deficit.  Right-sided headache 2 weeks EXAM: CT HEAD WITHOUT CONTRAST TECHNIQUE: Contiguous axial images were obtained from the base of the skull through the vertex without intravenous contrast. COMPARISON:  CT head 04/02/2021 FINDINGS: Brain: Generalized atrophy. Patchy white matter hypodensity bilaterally unchanged. Negative for acute infarct, hemorrhage, mass Vascular: Negative for hyperdense vessel Skull: Negative Sinuses/Orbits: Negative Other: None IMPRESSION: Atrophy and mild chronic microvascular ischemic change in the white matter. No acute abnormality Electronically Signed   By: Franchot Gallo M.D.   On: 04/08/2021 15:01   MR Brain W and Wo Contrast  Result Date: 04/08/2021 CLINICAL DATA:  Initial evaluation for acute headache. EXAM: MRI HEAD WITHOUT AND WITH CONTRAST TECHNIQUE: Multiplanar, multiecho pulse sequences of the brain and surrounding structures were obtained without and with intravenous contrast. CONTRAST:  8.44mL GADAVIST GADOBUTROL 1 MMOL/ML IV SOLN COMPARISON:  CT from earlier the same day. FINDINGS:  Brain: Age-related cerebral atrophy. Patchy T2/FLAIR hyperintensity involving the periventricular deep white matter both cerebral hemispheres most consistent with chronic small vessel ischemic disease, mild to moderate in nature. Small remote left cerebellar infarct noted. No abnormal foci of restricted diffusion to suggest acute or subacute ischemia. Gray-white matter differentiation otherwise maintained. No encephalomalacia to suggest chronic cortical infarction. No acute or chronic intracranial hemorrhage. No mass lesion, midline shift or mass effect. No hydrocephalus or extra-axial fluid collection. Pituitary gland suprasellar region within normal limits. Midline structures intact and normal. No abnormal enhancement. Vascular: Major  intracranial vascular flow voids are maintained. Skull and upper cervical spine: Craniocervical junction within normal limits. Bone marrow signal intensity normal. No scalp soft tissue abnormality. Sinuses/Orbits: Patient status post bilateral ocular lens replacement. Globes orbital soft tissues demonstrate no acute finding. Mild scattered mucosal thickening noted within the sphenoid ethmoidal sinuses. Paranasal sinuses are otherwise clear. No mastoid effusion. Inner ear structures grossly normal. Other: None. IMPRESSION: 1. No acute intracranial abnormality. 2. Age-related cerebral atrophy with mild to moderate chronic small vessel ischemic disease. Electronically Signed   By: Jeannine Boga M.D.   On: 04/08/2021 21:10   DG CHEST PORT 1 VIEW  Result Date: 04/09/2021 CLINICAL DATA:  Headache. EXAM: PORTABLE CHEST 1 VIEW COMPARISON:  06/28/2007. FINDINGS: The heart is enlarged and there is evidence of prior cardiothoracic surgery. A prosthetic cardiac valve is noted. There is atherosclerotic calcification of the aorta. Calcified pleural plaques are noted in the mid to lower lung fields bilaterally. There is mild airspace disease at the lung bases bilaterally. No effusion or pneumothorax. No acute osseous abnormality. IMPRESSION: 1. Mild atelectasis or infiltrate at the lung bases. 2. Cardiomegaly. Electronically Signed   By: Brett Fairy M.D.   On: 04/09/2021 01:40   ECHOCARDIOGRAM COMPLETE  Result Date: 04/09/2021    ECHOCARDIOGRAM REPORT   Patient Name:   [name] Date of Exam: 04/09/2021 Medical Rec #:  [id number]       Height:       65.0 in Accession #:    [id number]      Weight:       188.5 lb Date of Birth:  10/28/1924       BSA:          1.929 m Patient Age:    35 years        BP:           177/67 mmHg Patient Gender: M               HR:           69 bpm. Exam Location:  Inpatient Procedure: 2D Echo, Cardiac Doppler and Color Doppler Indications:    TIA  History:        Patient has prior  history of Echocardiogram examinations, most                 recent 01/13/2011. CAD, Arrythmias:Atrial Fibrillation; Risk                 Factors:Hypertension and Dyslipidemia.                 Aortic Valve: 23 mm Edwards bovine valve is present in the                 aortic position. Procedure Date: 2009.  Sonographer:    Bernadene Person RDCS Referring Phys: [place]  1. Left ventricular ejection fraction, by estimation, is 50 to 55%. The  left ventricle has low normal function. The left ventricle has no regional wall motion abnormalities. There is mild left ventricular hypertrophy. Left ventricular diastolic parameters are consistent with Grade I diastolic dysfunction (impaired relaxation).  2. Right ventricular systolic function is mildly reduced. The right ventricular size is normal. There is normal pulmonary artery systolic pressure.  3. Left atrial size was severely dilated.  4. Right atrial size was moderately dilated.  5. The mitral valve is grossly normal. Mild mitral valve regurgitation.  6. Prosthetic valve is well seated, no paravalvular leak. AV max 2.4 m/s. The aortic valve is grossly normal. Aortic valve regurgitation is not visualized. No aortic stenosis is present. There is a 23 mm Edwards bovine valve present in the aortic position. Procedure Date: 2009.  7. The inferior vena cava is normal in size with greater than 50% respiratory variability, suggesting right atrial pressure of 3 mmHg. Comparison(s): No prior Echocardiogram. Conclusion(s)/Recommendation(s): Otherwise normal echocardiogram, with minor abnormalities described in the report. FINDINGS  Left Ventricle: Left ventricular ejection fraction, by estimation, is 50 to 55%. The left ventricle has low normal function. The left ventricle has no regional wall motion abnormalities. The left ventricular internal cavity size was normal in size. There is mild left ventricular hypertrophy. Left ventricular diastolic parameters  are consistent with Grade I diastolic dysfunction (impaired relaxation). Right Ventricle: The right ventricular size is normal. No increase in right ventricular wall thickness. Right ventricular systolic function is mildly reduced. There is normal pulmonary artery systolic pressure. The tricuspid regurgitant velocity is 1.30 m/s, and with an assumed right atrial pressure of 3 mmHg, the estimated right ventricular systolic pressure is 9.8 mmHg. Left Atrium: Left atrial size was severely dilated. Right Atrium: Right atrial size was moderately dilated. Pericardium: There is no evidence of pericardial effusion. Mitral Valve: The mitral valve is grossly normal. Mild mitral valve regurgitation. Tricuspid Valve: The tricuspid valve is grossly normal. Tricuspid valve regurgitation is trivial. Aortic Valve: Prosthetic valve is well seated, no paravalvular leak. AV max 2.4 m/s. The aortic valve is grossly normal. Aortic valve regurgitation is not visualized. No aortic stenosis is present. Aortic valve mean gradient measures 12.5 mmHg. Aortic valve peak gradient measures 23.5 mmHg. Aortic valve area, by VTI measures 1.35 cm. There is a 23 mm Edwards bovine valve present in the aortic position. Procedure Date: 2009. Pulmonic Valve: The pulmonic valve was grossly normal. Pulmonic valve regurgitation is mild. Aorta: The aortic root and ascending aorta are structurally normal, with no evidence of dilitation. Venous: The inferior vena cava is normal in size with greater than 50% respiratory variability, suggesting right atrial pressure of 3 mmHg. IAS/Shunts: No atrial level shunt detected by color flow Doppler.  LEFT VENTRICLE PLAX 2D LVIDd:         5.00 cm     Diastology LVIDs:         3.90 cm     LV e' medial:    3.44 cm/s LV PW:         1.40 cm     LV E/e' medial:  41.6 LV IVS:        1.20 cm     LV e' lateral:   7.75 cm/s LVOT diam:     2.00 cm     LV E/e' lateral: 18.5 LV SV:         72 LV SV Index:   37 LVOT Area:     3.14  cm  LV Volumes (MOD) LV vol d, MOD A2C:  82.9 ml LV vol d, MOD A4C: 74.0 ml LV vol s, MOD A2C: 38.6 ml LV vol s, MOD A4C: 35.6 ml LV SV MOD A2C:     44.3 ml LV SV MOD A4C:     74.0 ml LV SV MOD BP:      41.2 ml RIGHT VENTRICLE RV S prime:     5.06 cm/s TAPSE (M-mode): 1.1 cm LEFT ATRIUM             Index        RIGHT ATRIUM           Index LA diam:        4.60 cm 2.38 cm/m   RA Area:     21.10 cm LA Vol (A2C):   77.3 ml 40.07 ml/m  RA Volume:   60.60 ml  31.42 ml/m LA Vol (A4C):   85.1 ml 44.12 ml/m LA Biplane Vol: 82.6 ml 42.82 ml/m  AORTIC VALVE AV Area (Vmax):    1.30 cm AV Area (Vmean):   1.31 cm AV Area (VTI):     1.35 cm AV Vmax:           242.50 cm/s AV Vmean:          163.000 cm/s AV VTI:            0.535 m AV Peak Grad:      23.5 mmHg AV Mean Grad:      12.5 mmHg LVOT Vmax:         100.00 cm/s LVOT Vmean:        67.800 cm/s LVOT VTI:          0.229 m LVOT/AV VTI ratio: 0.43  AORTA Ao Root diam: 3.10 cm Ao Asc diam:  3.60 cm MITRAL VALVE                TRICUSPID VALVE MV Area (PHT): 3.42 cm     TR Peak grad:   6.8 mmHg MV Decel Time: 222 msec     TR Vmax:        130.00 cm/s MV E velocity: 143.00 cm/s MV A velocity: 133.00 cm/s  SHUNTS MV E/A ratio:  1.08         Systemic VTI:  0.23 m                             Systemic Diam: 2.00 cm Phineas Inches Electronically signed by Phineas Inches Signature Date/Time: 04/09/2021/3:16:51 PM    Final      Assessment and Plan:   Hypertensive urgency Elevated troponin Presenting blood pressure 190/57.  Pressure has improved to the 170s.  Balancing hypertension and his history of orthostatic symptoms will be difficult given his age and risk of falls.  He did not tolerate amlodipine due to lower extremity swelling in the past. He developed hyperkalemia on lisinopril. Per his son, he has forgotten his hydralazine "a few times" but they are working on way to make sure he is compliant.   - hydralazine has been titrated to 100 mg 3 times daily - we will add 2.5  mg amlodipine and continue 20 mg lasix - monitor overnight  If more pressure support needed: consider  - switching toprol to coreg - will avoid clonidine long-term given orthostatic symptoms in the past - given wide pulse pressure, can consider adding long-acting nitrate, but hesitate to add given his ongoing headache   CAD status post redo CABG  and AVR 2008 Reassuring nuclear stress test in May 2013 He has a history of stable angina that improved with the addition of toprol.  Good aortic valve function on echo today.   Hyperlipidemia Continue 40 mg Lipitor   Carotid artery stenosis Mild disease by [place] 12/2017    Risk Assessment/Risk Scores:      For questions or updates, please contact [facility] Please consult [email or web address] for contact info under    Signed, Ledora Bottcher, [place]  04/09/2021 4:19 PM   Patient seen and examined. Agree with assessment and plan.  [name] is a very pleasant 87 year old widowed gentleman who has known CAD and is status post redo CABG revascularization and aortic valve replacement in 2008.  He is followed by Dr. Martinique and last saw him in August 2022.  Myoview study in 2013 was normal.  Recently, he has had blood pressure lability resulting in increased headaches.  Remotely when seen by Dr. Martinique he was on low-dose hydralazine at 25 mg 3 times a day, amlodipine 5 mg, metoprolol succinate 25 mg, and furosemide 20 mg daily.  Subsequently, he was taken off amlodipine due to lower extremity edema hydralazine was increased to 50 mg every 8 hours.  He has had recent ER evaluation for right-sided headache and hypertension.  Recent CT of his head on April 02, 2021 was negative for acute findings.  He read presented 1 week later an MRI of his brain showed age-related changes and mild to moderate chronic small vessel ischemic disease.  There was mild cardiomegaly on chest x-ray.  Presently, he feels well.  His blood pressure is elevated at  187/64.  His dose of hydralazine was recently increased today to 100 mg every 8 hours.  At present there is no significant edema.  HEENT is unremarkable.  There is no JVD.  His lungs were without rales or wheezes.  Rhythm was regular with a 1/6 systolic murmur in the aortic region.  Abdomen was soft and nontender.  His legs were wrapped with SCDs without significant underlying edema.  Laboratory is notable for creatinine 1.42.  GFR 46 with stage IIIa CKD.  BNP is minimally elevated at 448 more consistent with diastolic dysfunction with minimal troponin elevation at 28 not indicative of ACS.  Lipid studies suggest mixed hyperlipidemia with triglycerides at 170.  At present, since hydralazine was just increased to 100 mg every 8 hours will see how this affects his blood pressure after 5 doses.  In the interim recommend resumption of low-dose amlodipine perhaps at 2.5 mg since he is on furosemide which may prevent recurrent leg swelling.  His echo Doppler from today shows EF at 50 to 55% without regional wall motion abnormalities with mild LVH and grade 1 diastolic dysfunction.  There was mild MR.  His prosthetic aortic valve was well-seated without stenosis or regurgitation.  Colleague will follow in a.m.   Troy Sine, MD, [facility] 04/09/2021 5:14 PM

## 2021-04-09 NOTE — Progress Notes (Signed)
Patient transferred from ED at 1551hrs.  Oriented to unit and plan of care for shift.  Patient verbalized understanding.  Bed alarm on.
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
## 2021-04-09 NOTE — ED Notes (Signed)
Psalm Schappell son [phone] requesting an update on the patient

## 2021-04-09 NOTE — Progress Notes (Signed)
  Echocardiogram 2D Echocardiogram has been performed.  [name] 04/09/2021, 1:53 PM

## 2021-04-09 NOTE — Progress Notes (Signed)
PROGRESS NOTE  [name]  [id number] DOB: 03/18/1925 DOA: 04/08/2021 PCP: Haydee Salter, MD  Outpatient Specialists: Cardiology  Brief Narrative: [name] is a 87 y.o. male with a history of CAD s/p CABG, s/p bioprosthetic AVR, HTN, HLD, paroxysmal atrial fibrillation, who presented to the ED 12/8 with headache and high blood pressure (for which he was seen 12/1) now with confusion, concern for stroke.    Assessment & Plan: Principal Problem:   Hypertensive urgency Active Problems:   Essential hypertension   Hyperlipidemia   Coronary artery disease   Atrial fibrillation (HCC)   Carotid artery disease (HCC)   CKD (chronic kidney disease), stage III (HCC)  HTN urgency: Previously stopped norvasc due to LE swelling (with resultant significant improvement in swelling), previously stopped other medications due to electrolyte imbalances.  - Increased hydralazine 50mg  > 100mg  TID beginning this morning with modest improvement in BP, still with headache.  - Continue metoprolol at present dose with HR in 60's.  - Continue lasix 20mg  daily - Goal reduction would be not more than 20-25% in 24 hours, e.g. MAP ~75-80. With his age and history of orthostatic symptoms, his goal SBP long term may be near 150.  - His cardiologist has been active in titrating his medications as an outpatient, so will consult them here to guide therapy.  - Echocardiogram ordered as LV systolic dysfunction may change management. ?if would benefit from increasing diuretic still with swelling (though is clearly multifactorial including due to hypoalbuminemia).   Headache: Unclear etiology, though no acute findings on CT or MRI. No findings suggestive of PRES at this time. This has been persistent, though not severe enough for the patient to request prn's at this time.  - Continue prn's and BP management as above.   CAD s/p CABG remotely, HLD:  - Continue BB, ASA, statin - Troponin checked despite no  report of chest pain and slightly elevated as anticipated with HTN urgency. Will defer to cardiology, though the yield of further work up at this time seems low.  Paroxysmal atrial fibrillation: NSR currently. - Continue metoprolol - Not on anticoagulation due to age/fall risk.   Acute metabolic encephalopathy: Appears to have resolved with Tx severe HTN, and was mild. Negative neuroimaging. - Delirium precautions. Has not suffered severe hospital delirium in the past per pt's son.   Glaucoma: No vision changes.  - Continue gtt's  Obesity: Estimated body mass index is 31.37 kg/m as calculated from the following:   Height as of this encounter: 5\' 5"  (1.651 m).   Weight as of this encounter: 85.5 kg.  Advanced age: Lives alone, does have a component of gait instability for which PT and OT recommend home health which will be ordered prior to discharge.  DVT prophylaxis: SCDs Code Status: DNR Family Communication: Son by phone Disposition Plan:  Status is: Observation  The patient will require care spanning > 2 midnights and should be moved to inpatient because: Continue symptomatic HTN   Consultants:  Cardiology  Procedures:  Echocardiogram (pending)  Antimicrobials: None   Subjective: Headache on top of head is waxing/waning, difficult to describe, still about 6/10 this morning and 4/10 this afternoon, though he's not requested any pain medications and is sleeping on my reevaluation this PM.  Objective: Vitals:   04/09/21 1130 04/09/21 1145  BP: (!) 168/71 (!) 174/71  Pulse: 61 60  Resp: 20 12  Temp:    TempSrc:    SpO2: 95% 95%  Weight:  Height:     Filed Weights   04/08/21 1420  Weight: 85.5 kg   Gen: Elderly, HOH male in no distress Pulm: Non-labored breathing room air. Clear to auscultation bilaterally.  CV: Regular rate and rhythm. No murmur, rub, or gallop. No JVD, 1+ pitting pedal edema. GI: Abdomen soft, non-tender, non-distended, with normoactive  bowel sounds. No organomegaly or masses felt. Ext: Warm, no deformities Skin: No rashes, lesions or ulcers on visualized skin. Neuro: Alert and oriented. No focal neurological deficits. Psych: Judgement and insight appear normal. Mood & affect appropriate.   Data Reviewed: I have personally reviewed following labs and imaging studies  CBC: Recent Labs  Lab 04/08/21 1432 04/08/21 1438 04/09/21 0353  WBC 6.1  --  6.3  NEUTROABS 3.3  --  3.6  HGB 12.1* 11.9* 11.6*  HCT 37.4* 35.0* 35.5*  MCV 90.8  --  89.2  PLT 163  --  0000000   Basic Metabolic Panel: Recent Labs  Lab 04/08/21 1432 04/08/21 1438 04/09/21 0353  NA 140 142 140  K 4.0 4.0 4.0  CL 107 107 108  CO2 26  --  25  GLUCOSE 84 80 96  BUN 21 23 22   CREATININE 1.42* 1.40* 1.34*  CALCIUM 8.9  --  8.5*  MG  --   --  2.0  PHOS  --   --  3.4   GFR: Estimated Creatinine Clearance: 33.9 mL/min (A) (by C-G formula based on SCr of 1.34 mg/dL (H)). Liver Function Tests: Recent Labs  Lab 04/08/21 1432 04/09/21 0353  AST 20 18  ALT 14 16  ALKPHOS 64 55  BILITOT 0.5 0.8  PROT 6.0* 5.8*  ALBUMIN 3.3* 3.0*   No results for input(s): LIPASE, AMYLASE in the last 168 hours. No results for input(s): AMMONIA in the last 168 hours. Coagulation Profile: Recent Labs  Lab 04/08/21 1432  INR 0.9   Cardiac Enzymes: No results for input(s): CKTOTAL, CKMB, CKMBINDEX, TROPONINI in the last 168 hours. BNP (last 3 results) No results for input(s): PROBNP in the last 8760 hours. HbA1C: No results for input(s): HGBA1C in the last 72 hours. CBG: No results for input(s): GLUCAP in the last 168 hours. Lipid Profile: Recent Labs    04/09/21 0353  CHOL 150  HDL 39*  LDLCALC 77  TRIG 170*  CHOLHDL 3.8   Thyroid Function Tests: Recent Labs    04/09/21 0353  TSH 5.903*  FREET4 0.87   Anemia Panel: No results for input(s): VITAMINB12, FOLATE, FERRITIN, TIBC, IRON, RETICCTPCT in the last 72 hours. Urine analysis:     Component Value Date/Time   COLORURINE YELLOW 05/31/2007 [place] 05/31/2007 1055   LABSPEC 1.018 05/31/2007 1055   PHURINE 5.5 05/31/2007 1055   GLUCOSEU NEGATIVE 05/31/2007 1055   HGBUR MODERATE (A) 05/31/2007 1055   BILIRUBINUR NEGATIVE 05/31/2007 1055   KETONESUR NEGATIVE 05/31/2007 1055   PROTEINUR NEGATIVE 05/31/2007 1055   UROBILINOGEN 0.2 05/31/2007 1055   NITRITE NEGATIVE 05/31/2007 1055   LEUKOCYTESUR NEGATIVE 05/31/2007 1055   Recent Results (from the past 240 hour(s))  Resp Panel by RT-PCR (Flu A&B, Covid) Nasopharyngeal Swab     Status: None   Collection Time: 04/08/21  3:05 PM   Specimen: Nasopharyngeal Swab; Nasopharyngeal(NP) swabs in vial transport medium  Result Value Ref Range Status   SARS Coronavirus 2 by RT PCR NEGATIVE NEGATIVE Final    Comment: (NOTE) SARS-CoV-2 target nucleic acids are NOT DETECTED.  The SARS-CoV-2 RNA is generally detectable  in upper respiratory specimens during the acute phase of infection. The lowest concentration of SARS-CoV-2 viral copies this assay can detect is 138 copies/mL. A negative result does not preclude SARS-Cov-2 infection and should not be used as the sole basis for treatment or other patient management decisions. A negative result may occur with  improper specimen collection/handling, submission of specimen other than nasopharyngeal swab, presence of viral mutation(s) within the areas targeted by this assay, and inadequate number of viral copies(<138 copies/mL). A negative result must be combined with clinical observations, patient history, and epidemiological information. The expected result is Negative.  Fact Sheet for Patients:  EntrepreneurPulse.com.au  Fact Sheet for Healthcare Providers:  IncredibleEmployment.be  This test is no t yet approved or cleared by the [place] FDA and  has been authorized for detection and/or diagnosis of SARS-CoV-2 by FDA  under an Emergency Use Authorization (EUA). This EUA will remain  in effect (meaning this test can be used) for the duration of the COVID-19 declaration under Section 564(b)(1) of the Act, 21 U.S.C.section 360bbb-3(b)(1), unless the authorization is terminated  or revoked sooner.       Influenza A by PCR NEGATIVE NEGATIVE Final   Influenza B by PCR NEGATIVE NEGATIVE Final    Comment: (NOTE) The Xpert Xpress SARS-CoV-2/FLU/RSV plus assay is intended as an aid in the diagnosis of influenza from Nasopharyngeal swab specimens and should not be used as a sole basis for treatment. Nasal washings and aspirates are unacceptable for Xpert Xpress SARS-CoV-2/FLU/RSV testing.  Fact Sheet for Patients: EntrepreneurPulse.com.au  Fact Sheet for Healthcare Providers: IncredibleEmployment.be  This test is not yet approved or cleared by the [place] FDA and has been authorized for detection and/or diagnosis of SARS-CoV-2 by FDA under an Emergency Use Authorization (EUA). This EUA will remain in effect (meaning this test can be used) for the duration of the COVID-19 declaration under Section 564(b)(1) of the Act, 21 U.S.C. section 360bbb-3(b)(1), unless the authorization is terminated or revoked.  Performed at [facility], [place] [address], [place], [place] [address]       Radiology Studies: CT HEAD WO CONTRAST  Result Date: 04/08/2021 CLINICAL DATA:  Acute neuro deficit.  Right-sided headache 2 weeks EXAM: CT HEAD WITHOUT CONTRAST TECHNIQUE: Contiguous axial images were obtained from the base of the skull through the vertex without intravenous contrast. COMPARISON:  CT head 04/02/2021 FINDINGS: Brain: Generalized atrophy. Patchy white matter hypodensity bilaterally unchanged. Negative for acute infarct, hemorrhage, mass Vascular: Negative for hyperdense vessel Skull: Negative Sinuses/Orbits: Negative Other: None IMPRESSION: Atrophy and mild chronic  microvascular ischemic change in the white matter. No acute abnormality Electronically Signed   By: Franchot Gallo M.D.   On: 04/08/2021 15:01   MR Brain W and Wo Contrast  Result Date: 04/08/2021 CLINICAL DATA:  Initial evaluation for acute headache. EXAM: MRI HEAD WITHOUT AND WITH CONTRAST TECHNIQUE: Multiplanar, multiecho pulse sequences of the brain and surrounding structures were obtained without and with intravenous contrast. CONTRAST:  8.67mL GADAVIST GADOBUTROL 1 MMOL/ML IV SOLN COMPARISON:  CT from earlier the same day. FINDINGS: Brain: Age-related cerebral atrophy. Patchy T2/FLAIR hyperintensity involving the periventricular deep white matter both cerebral hemispheres most consistent with chronic small vessel ischemic disease, mild to moderate in nature. Small remote left cerebellar infarct noted. No abnormal foci of restricted diffusion to suggest acute or subacute ischemia. Gray-white matter differentiation otherwise maintained. No encephalomalacia to suggest chronic cortical infarction. No acute or chronic intracranial hemorrhage. No mass lesion, midline shift or  mass effect. No hydrocephalus or extra-axial fluid collection. Pituitary gland suprasellar region within normal limits. Midline structures intact and normal. No abnormal enhancement. Vascular: Major intracranial vascular flow voids are maintained. Skull and upper cervical spine: Craniocervical junction within normal limits. Bone marrow signal intensity normal. No scalp soft tissue abnormality. Sinuses/Orbits: Patient status post bilateral ocular lens replacement. Globes orbital soft tissues demonstrate no acute finding. Mild scattered mucosal thickening noted within the sphenoid ethmoidal sinuses. Paranasal sinuses are otherwise clear. No mastoid effusion. Inner ear structures grossly normal. Other: None. IMPRESSION: 1. No acute intracranial abnormality. 2. Age-related cerebral atrophy with mild to moderate chronic small vessel ischemic  disease. Electronically Signed   By: Jeannine Boga M.D.   On: 04/08/2021 21:10   DG CHEST PORT 1 VIEW  Result Date: 04/09/2021 CLINICAL DATA:  Headache. EXAM: PORTABLE CHEST 1 VIEW COMPARISON:  06/28/2007. FINDINGS: The heart is enlarged and there is evidence of prior cardiothoracic surgery. A prosthetic cardiac valve is noted. There is atherosclerotic calcification of the aorta. Calcified pleural plaques are noted in the mid to lower lung fields bilaterally. There is mild airspace disease at the lung bases bilaterally. No effusion or pneumothorax. No acute osseous abnormality. IMPRESSION: 1. Mild atelectasis or infiltrate at the lung bases. 2. Cardiomegaly. Electronically Signed   By: Brett Fairy M.D.   On: 04/09/2021 01:40   ECHOCARDIOGRAM COMPLETE  Result Date: 04/09/2021    ECHOCARDIOGRAM REPORT   Patient Name:   [name] Date of Exam: 04/09/2021 Medical Rec #:  [id number]       Height:       65.0 in Accession #:    [id number]      Weight:       188.5 lb Date of Birth:  27-Nov-1924       BSA:          1.929 m Patient Age:    43 years        BP:           177/67 mmHg Patient Gender: M               HR:           69 bpm. Exam Location:  Inpatient Procedure: 2D Echo, Cardiac Doppler and Color Doppler Indications:    TIA  History:        Patient has prior history of Echocardiogram examinations, most                 recent 01/13/2011. CAD, Arrythmias:Atrial Fibrillation; Risk                 Factors:Hypertension and Dyslipidemia.                 Aortic Valve: 23 mm Edwards bovine valve is present in the                 aortic position. Procedure Date: 2009.  Sonographer:    Bernadene Person RDCS Referring Phys: [place]  1. Left ventricular ejection fraction, by estimation, is 50 to 55%. The left ventricle has low normal function. The left ventricle has no regional wall motion abnormalities. There is mild left ventricular hypertrophy. Left ventricular diastolic parameters  are consistent with Grade I diastolic dysfunction (impaired relaxation).  2. Right ventricular systolic function is mildly reduced. The right ventricular size is normal. There is normal pulmonary artery systolic pressure.  3. Left atrial size was severely dilated.  4. Right atrial size was moderately  dilated.  5. The mitral valve is grossly normal. Mild mitral valve regurgitation.  6. Prosthetic valve is well seated, no paravalvular leak. AV max 2.4 m/s. The aortic valve is grossly normal. Aortic valve regurgitation is not visualized. No aortic stenosis is present. There is a 23 mm Edwards bovine valve present in the aortic position. Procedure Date: 2009.  7. The inferior vena cava is normal in size with greater than 50% respiratory variability, suggesting right atrial pressure of 3 mmHg. Comparison(s): No prior Echocardiogram. Conclusion(s)/Recommendation(s): Otherwise normal echocardiogram, with minor abnormalities described in the report. FINDINGS  Left Ventricle: Left ventricular ejection fraction, by estimation, is 50 to 55%. The left ventricle has low normal function. The left ventricle has no regional wall motion abnormalities. The left ventricular internal cavity size was normal in size. There is mild left ventricular hypertrophy. Left ventricular diastolic parameters are consistent with Grade I diastolic dysfunction (impaired relaxation). Right Ventricle: The right ventricular size is normal. No increase in right ventricular wall thickness. Right ventricular systolic function is mildly reduced. There is normal pulmonary artery systolic pressure. The tricuspid regurgitant velocity is 1.30 m/s, and with an assumed right atrial pressure of 3 mmHg, the estimated right ventricular systolic pressure is 9.8 mmHg. Left Atrium: Left atrial size was severely dilated. Right Atrium: Right atrial size was moderately dilated. Pericardium: There is no evidence of pericardial effusion. Mitral Valve: The mitral valve is  grossly normal. Mild mitral valve regurgitation. Tricuspid Valve: The tricuspid valve is grossly normal. Tricuspid valve regurgitation is trivial. Aortic Valve: Prosthetic valve is well seated, no paravalvular leak. AV max 2.4 m/s. The aortic valve is grossly normal. Aortic valve regurgitation is not visualized. No aortic stenosis is present. Aortic valve mean gradient measures 12.5 mmHg. Aortic valve peak gradient measures 23.5 mmHg. Aortic valve area, by VTI measures 1.35 cm. There is a 23 mm Edwards bovine valve present in the aortic position. Procedure Date: 2009. Pulmonic Valve: The pulmonic valve was grossly normal. Pulmonic valve regurgitation is mild. Aorta: The aortic root and ascending aorta are structurally normal, with no evidence of dilitation. Venous: The inferior vena cava is normal in size with greater than 50% respiratory variability, suggesting right atrial pressure of 3 mmHg. IAS/Shunts: No atrial level shunt detected by color flow Doppler.  LEFT VENTRICLE PLAX 2D LVIDd:         5.00 cm     Diastology LVIDs:         3.90 cm     LV e' medial:    3.44 cm/s LV PW:         1.40 cm     LV E/e' medial:  41.6 LV IVS:        1.20 cm     LV e' lateral:   7.75 cm/s LVOT diam:     2.00 cm     LV E/e' lateral: 18.5 LV SV:         72 LV SV Index:   37 LVOT Area:     3.14 cm  LV Volumes (MOD) LV vol d, MOD A2C: 82.9 ml LV vol d, MOD A4C: 74.0 ml LV vol s, MOD A2C: 38.6 ml LV vol s, MOD A4C: 35.6 ml LV SV MOD A2C:     44.3 ml LV SV MOD A4C:     74.0 ml LV SV MOD BP:      41.2 ml RIGHT VENTRICLE RV S prime:     5.06 cm/s TAPSE (M-mode): 1.1 cm LEFT ATRIUM  Index        RIGHT ATRIUM           Index LA diam:        4.60 cm 2.38 cm/m   RA Area:     21.10 cm LA Vol (A2C):   77.3 ml 40.07 ml/m  RA Volume:   60.60 ml  31.42 ml/m LA Vol (A4C):   85.1 ml 44.12 ml/m LA Biplane Vol: 82.6 ml 42.82 ml/m  AORTIC VALVE AV Area (Vmax):    1.30 cm AV Area (Vmean):   1.31 cm AV Area (VTI):     1.35 cm AV  Vmax:           242.50 cm/s AV Vmean:          163.000 cm/s AV VTI:            0.535 m AV Peak Grad:      23.5 mmHg AV Mean Grad:      12.5 mmHg LVOT Vmax:         100.00 cm/s LVOT Vmean:        67.800 cm/s LVOT VTI:          0.229 m LVOT/AV VTI ratio: 0.43  AORTA Ao Root diam: 3.10 cm Ao Asc diam:  3.60 cm MITRAL VALVE                TRICUSPID VALVE MV Area (PHT): 3.42 cm     TR Peak grad:   6.8 mmHg MV Decel Time: 222 msec     TR Vmax:        130.00 cm/s MV E velocity: 143.00 cm/s MV A velocity: 133.00 cm/s  SHUNTS MV E/A ratio:  1.08         Systemic VTI:  0.23 m                             Systemic Diam: 2.00 cm Phineas Inches Electronically signed by Phineas Inches Signature Date/Time: 04/09/2021/3:16:51 PM    Final     Scheduled Meds:  acetaminophen  1,000 mg Oral Once   aspirin  81 mg Oral Daily   atorvastatin  40 mg Oral Daily   brimonidine  1 drop Left Eye BID   And   timolol  1 drop Left Eye BID   furosemide  20 mg Oral Daily   hydrALAZINE  100 mg Oral TID   latanoprost  1 drop Left Eye QHS   metoprolol succinate  25 mg Oral Daily   sodium chloride flush  3 mL Intravenous Q12H   Continuous Infusions:  sodium chloride       LOS: 0 days   Time spent: 25 minutes.  Patrecia Pour, MD [facility] [email or web address] 04/09/2021, 3:22 PM
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
## 2021-04-10 DIAGNOSIS — N1831 Chronic kidney disease, stage 3a: Secondary | ICD-10-CM | POA: Diagnosis not present

## 2021-04-10 DIAGNOSIS — I480 Paroxysmal atrial fibrillation: Secondary | ICD-10-CM | POA: Diagnosis not present

## 2021-04-10 DIAGNOSIS — I160 Hypertensive urgency: Secondary | ICD-10-CM | POA: Diagnosis not present

## 2021-04-10 DIAGNOSIS — I25119 Atherosclerotic heart disease of native coronary artery with unspecified angina pectoris: Secondary | ICD-10-CM | POA: Diagnosis not present

## 2021-04-10 LAB — BASIC METABOLIC PANEL
Anion gap: 7 (ref 5–15)
BUN: 20 mg/dL (ref 8–23)
CO2: 26 mmol/L (ref 22–32)
Calcium: 8.7 mg/dL — ABNORMAL LOW (ref 8.9–10.3)
Chloride: 105 mmol/L (ref 98–111)
Creatinine, Ser: 1.42 mg/dL — ABNORMAL HIGH (ref 0.61–1.24)
GFR, Estimated: 46 mL/min — ABNORMAL LOW (ref >60)
Glucose, Bld: 115 mg/dL — ABNORMAL HIGH (ref 70–99)
Potassium: 4.2 mmol/L (ref 3.5–5.1)
Sodium: 138 mmol/L (ref 135–145)

## 2021-04-10 LAB — T3: T3, Total: 98 ng/dL (ref 71–180)

## 2021-04-10 LAB — MAGNESIUM: Magnesium: 2 mg/dL (ref 1.7–2.4)

## 2021-04-10 MED ORDER — POLYETHYLENE GLYCOL 3350 17 G PO PACK
17.0000 g | PACK | Freq: Every day | ORAL | Status: DC
  Administered 2021-04-11 – 2021-04-12 (×2): 17 g via ORAL
  Filled 2021-04-10 (×2): qty 1

## 2021-04-10 MED ORDER — TRAMADOL HCL 50 MG PO TABS: 50.0000 mg | ORAL_TABLET | Freq: Four times a day (QID) | ORAL | Status: DC | PRN

## 2021-04-10 MED ORDER — CARVEDILOL 6.25 MG PO TABS
6.2500 mg | ORAL_TABLET | Freq: Two times a day (BID) | ORAL | Status: DC
  Administered 2021-04-10 – 2021-04-12 (×5): 6.25 mg via ORAL
  Filled 2021-04-10 (×5): qty 1

## 2021-04-10 NOTE — Progress Notes (Signed)
Progress Note  Patient Name: [name] Date of Encounter: 04/10/2021  [facility] Cardiologist: Peter [place]az[place], MD   Subjective   NAEO. BP have improved.  Inpatient Medications    Scheduled Meds:  acetaminophen  1,000 mg Oral Once   amLODipine  2.5 mg Oral Daily   aspirin  81 mg Oral Daily   atorvastatin  40 mg Oral Daily   brimonidine  1 drop Left Eye BID   And   timolol  1 drop Left Eye BID   furosemide  20 mg Oral Daily   hydrALAZINE  100 mg Oral TID   latanoprost  1 drop Left Eye QHS   metoprolol succinate  25 mg Oral Daily   sodium chloride flush  3 mL Intravenous Q12H   Continuous Infusions:  sodium chloride     PRN Meds: sodium chloride, acetaminophen **OR** acetaminophen, cloNIDine, HYDROcodone-acetaminophen, sodium chloride flush   Vital Signs    Vitals:   04/09/21 2011 04/09/21 2257 04/09/21 2300 04/10/21 0400  BP: (!) 152/57 (!) 140/53  (!) 165/52  Pulse: 69     Resp:  18  18  Temp: 98.7 F (37.1 C) 98.3 F (36.8 C) 98.3 F (36.8 C) 98.5 F (36.9 C)  TempSrc: Oral Oral Oral Oral  SpO2: 92%     Weight:      Height:        Intake/Output Summary (Last 24 hours) at 04/10/2021 0855 Last data filed at 04/09/2021 2254 Gross per 24 hour  Intake 360 ml  Output --  Net 360 ml   Last 3 Weights 04/09/2021 04/08/2021 03/18/2021  Weight (lbs) 184 lb 188 lb 7.9 oz 188 lb 9.6 oz  Weight (kg) 83.462 kg 85.5 kg 85.548 kg      Telemetry    reviewed - Personally Reviewed  ECG    Personally Reviewed  Physical Exam   GEN: No acute distress.  Elderly male in bed. Neck: No JVD Cardiac: RRR, no murmurs, rubs, or gallops.  Respiratory: Clear to auscultation bilaterally. GI: Soft, nontender, non-distended  MS: No edema; No deformity. Neuro:  Nonfocal  Psych: Normal affect   Labs    High Sensitivity Troponin:   Recent Labs  Lab 04/09/21 0353  TROPONINIHS 28*     Chemistry Recent Labs  Lab 04/08/21 1432 04/08/21 1438 04/09/21 0353  04/10/21 0641  NA 140 142 140 138  K 4.0 4.0 4.0 4.2  CL 107 107 108 105  CO2 26  --  25 26  GLUCOSE 84 80 96 115*  BUN 21 23 22 20   CREATININE 1.42* 1.40* 1.34* 1.42*  CALCIUM 8.9  --  8.5* 8.7*  MG  --   --  2.0 2.0  PROT 6.0*  --  5.8*  --   ALBUMIN 3.3*  --  3.0*  --   AST 20  --  18  --   ALT 14  --  16  --   ALKPHOS 64  --  55  --   BILITOT 0.5  --  0.8  --   GFRNONAA 46*  --  49* 46*  ANIONGAP 7  --  7 7    Lipids  Recent Labs  Lab 04/09/21 0353  CHOL 150  TRIG 170*  HDL 39*  LDLCALC 77  CHOLHDL 3.8    Hematology Recent Labs  Lab 04/08/21 1432 04/08/21 1438 04/09/21 0353  WBC 6.1  --  6.3  RBC 4.12*  --  3.98*  HGB 12.1* 11.9*  11.6*  HCT 37.4* 35.0* 35.5*  MCV 90.8  --  89.2  MCH 29.4  --  29.1  MCHC 32.4  --  32.7  RDW 14.9  --  14.9  PLT 163  --  162   Thyroid  Recent Labs  Lab 04/09/21 0353  TSH 5.903*  FREET4 0.87    BNP Recent Labs  Lab 04/09/21 0353  BNP 448.2*    DDimer No results for input(s): DDIMER in the last 168 hours.   Radiology    CT HEAD WO CONTRAST  Result Date: 04/08/2021 CLINICAL DATA:  Acute neuro deficit.  Right-sided headache 2 weeks EXAM: CT HEAD WITHOUT CONTRAST TECHNIQUE: Contiguous axial images were obtained from the base of the skull through the vertex without intravenous contrast. COMPARISON:  CT head 04/02/2021 FINDINGS: Brain: Generalized atrophy. Patchy white matter hypodensity bilaterally unchanged. Negative for acute infarct, hemorrhage, mass Vascular: Negative for hyperdense vessel Skull: Negative Sinuses/Orbits: Negative Other: None IMPRESSION: Atrophy and mild chronic microvascular ischemic change in the white matter. No acute abnormality Electronically Signed   By: Franchot Gallo M.D.   On: 04/08/2021 15:01   MR Brain W and Wo Contrast  Result Date: 04/08/2021 CLINICAL DATA:  Initial evaluation for acute headache. EXAM: MRI HEAD WITHOUT AND WITH CONTRAST TECHNIQUE: Multiplanar, multiecho pulse sequences  of the brain and surrounding structures were obtained without and with intravenous contrast. CONTRAST:  8.79mL GADAVIST GADOBUTROL 1 MMOL/ML IV SOLN COMPARISON:  CT from earlier the same day. FINDINGS: Brain: Age-related cerebral atrophy. Patchy T2/FLAIR hyperintensity involving the periventricular deep white matter both cerebral hemispheres most consistent with chronic small vessel ischemic disease, mild to moderate in nature. Small remote left cerebellar infarct noted. No abnormal foci of restricted diffusion to suggest acute or subacute ischemia. Gray-white matter differentiation otherwise maintained. No encephalomalacia to suggest chronic cortical infarction. No acute or chronic intracranial hemorrhage. No mass lesion, midline shift or mass effect. No hydrocephalus or extra-axial fluid collection. Pituitary gland suprasellar region within normal limits. Midline structures intact and normal. No abnormal enhancement. Vascular: Major intracranial vascular flow voids are maintained. Skull and upper cervical spine: Craniocervical junction within normal limits. Bone marrow signal intensity normal. No scalp soft tissue abnormality. Sinuses/Orbits: Patient status post bilateral ocular lens replacement. Globes orbital soft tissues demonstrate no acute finding. Mild scattered mucosal thickening noted within the sphenoid ethmoidal sinuses. Paranasal sinuses are otherwise clear. No mastoid effusion. Inner ear structures grossly normal. Other: None. IMPRESSION: 1. No acute intracranial abnormality. 2. Age-related cerebral atrophy with mild to moderate chronic small vessel ischemic disease. Electronically Signed   By: Jeannine Boga M.D.   On: 04/08/2021 21:10   DG CHEST PORT 1 VIEW  Result Date: 04/09/2021 CLINICAL DATA:  Headache. EXAM: PORTABLE CHEST 1 VIEW COMPARISON:  06/28/2007. FINDINGS: The heart is enlarged and there is evidence of prior cardiothoracic surgery. A prosthetic cardiac valve is noted. There is  atherosclerotic calcification of the aorta. Calcified pleural plaques are noted in the mid to lower lung fields bilaterally. There is mild airspace disease at the lung bases bilaterally. No effusion or pneumothorax. No acute osseous abnormality. IMPRESSION: 1. Mild atelectasis or infiltrate at the lung bases. 2. Cardiomegaly. Electronically Signed   By: Brett Fairy M.D.   On: 04/09/2021 01:40   ECHOCARDIOGRAM COMPLETE  Result Date: 04/09/2021    ECHOCARDIOGRAM REPORT   Patient Name:   [name] Date of Exam: 04/09/2021 Medical Rec #:  [id number]       Height:  65.0 in Accession #:    [id number]      Weight:       188.5 lb Date of Birth:  09-16-1924       BSA:          1.929 m Patient Age:    32 years        BP:           177/67 mmHg Patient Gender: M               HR:           69 bpm. Exam Location:  Inpatient Procedure: 2D Echo, Cardiac Doppler and Color Doppler Indications:    TIA  History:        Patient has prior history of Echocardiogram examinations, most                 recent 01/13/2011. CAD, Arrythmias:Atrial Fibrillation; Risk                 Factors:Hypertension and Dyslipidemia.                 Aortic Valve: 23 mm Edwards bovine valve is present in the                 aortic position. Procedure Date: 2009.  Sonographer:    Bernadene Person RDCS Referring Phys: [place]  1. Left ventricular ejection fraction, by estimation, is 50 to 55%. The left ventricle has low normal function. The left ventricle has no regional wall motion abnormalities. There is mild left ventricular hypertrophy. Left ventricular diastolic parameters are consistent with Grade I diastolic dysfunction (impaired relaxation).  2. Right ventricular systolic function is mildly reduced. The right ventricular size is normal. There is normal pulmonary artery systolic pressure.  3. Left atrial size was severely dilated.  4. Right atrial size was moderately dilated.  5. The mitral valve is grossly normal.  Mild mitral valve regurgitation.  6. Prosthetic valve is well seated, no paravalvular leak. AV max 2.4 m/s. The aortic valve is grossly normal. Aortic valve regurgitation is not visualized. No aortic stenosis is present. There is a 23 mm Edwards bovine valve present in the aortic position. Procedure Date: 2009.  7. The inferior vena cava is normal in size with greater than 50% respiratory variability, suggesting right atrial pressure of 3 mmHg. Comparison(s): No prior Echocardiogram. Conclusion(s)/Recommendation(s): Otherwise normal echocardiogram, with minor abnormalities described in the report. FINDINGS  Left Ventricle: Left ventricular ejection fraction, by estimation, is 50 to 55%. The left ventricle has low normal function. The left ventricle has no regional wall motion abnormalities. The left ventricular internal cavity size was normal in size. There is mild left ventricular hypertrophy. Left ventricular diastolic parameters are consistent with Grade I diastolic dysfunction (impaired relaxation). Right Ventricle: The right ventricular size is normal. No increase in right ventricular wall thickness. Right ventricular systolic function is mildly reduced. There is normal pulmonary artery systolic pressure. The tricuspid regurgitant velocity is 1.30 m/s, and with an assumed right atrial pressure of 3 mmHg, the estimated right ventricular systolic pressure is 9.8 mmHg. Left Atrium: Left atrial size was severely dilated. Right Atrium: Right atrial size was moderately dilated. Pericardium: There is no evidence of pericardial effusion. Mitral Valve: The mitral valve is grossly normal. Mild mitral valve regurgitation. Tricuspid Valve: The tricuspid valve is grossly normal. Tricuspid valve regurgitation is trivial. Aortic Valve: Prosthetic valve is well seated, no paravalvular leak. AV max 2.4 m/s. The  aortic valve is grossly normal. Aortic valve regurgitation is not visualized. No aortic stenosis is present. Aortic  valve mean gradient measures 12.5 mmHg. Aortic valve peak gradient measures 23.5 mmHg. Aortic valve area, by VTI measures 1.35 cm. There is a 23 mm Edwards bovine valve present in the aortic position. Procedure Date: 2009. Pulmonic Valve: The pulmonic valve was grossly normal. Pulmonic valve regurgitation is mild. Aorta: The aortic root and ascending aorta are structurally normal, with no evidence of dilitation. Venous: The inferior vena cava is normal in size with greater than 50% respiratory variability, suggesting right atrial pressure of 3 mmHg. IAS/Shunts: No atrial level shunt detected by color flow Doppler.  LEFT VENTRICLE PLAX 2D LVIDd:         5.00 cm     Diastology LVIDs:         3.90 cm     LV e' medial:    3.44 cm/s LV PW:         1.40 cm     LV E/e' medial:  41.6 LV IVS:        1.20 cm     LV e' lateral:   7.75 cm/s LVOT diam:     2.00 cm     LV E/e' lateral: 18.5 LV SV:         72 LV SV Index:   37 LVOT Area:     3.14 cm  LV Volumes (MOD) LV vol d, MOD A2C: 82.9 ml LV vol d, MOD A4C: 74.0 ml LV vol s, MOD A2C: 38.6 ml LV vol s, MOD A4C: 35.6 ml LV SV MOD A2C:     44.3 ml LV SV MOD A4C:     74.0 ml LV SV MOD BP:      41.2 ml RIGHT VENTRICLE RV S prime:     5.06 cm/s TAPSE (M-mode): 1.1 cm LEFT ATRIUM             Index        RIGHT ATRIUM           Index LA diam:        4.60 cm 2.38 cm/m   RA Area:     21.10 cm LA Vol (A2C):   77.3 ml 40.07 ml/m  RA Volume:   60.60 ml  31.42 ml/m LA Vol (A4C):   85.1 ml 44.12 ml/m LA Biplane Vol: 82.6 ml 42.82 ml/m  AORTIC VALVE AV Area (Vmax):    1.30 cm AV Area (Vmean):   1.31 cm AV Area (VTI):     1.35 cm AV Vmax:           242.50 cm/s AV Vmean:          163.000 cm/s AV VTI:            0.535 m AV Peak Grad:      23.5 mmHg AV Mean Grad:      12.5 mmHg LVOT Vmax:         100.00 cm/s LVOT Vmean:        67.800 cm/s LVOT VTI:          0.229 m LVOT/AV VTI ratio: 0.43  AORTA Ao Root diam: 3.10 cm Ao Asc diam:  3.60 cm MITRAL VALVE                TRICUSPID VALVE MV  Area (PHT): 3.42 cm     TR Peak grad:   6.8 mmHg MV Decel Time: 222 msec  TR Vmax:        130.00 cm/s MV E velocity: 143.00 cm/s MV A velocity: 133.00 cm/s  SHUNTS MV E/A ratio:  1.08         Systemic VTI:  0.23 m                             Systemic Diam: 2.00 cm Phineas Inches Electronically signed by Phineas Inches Signature Date/Time: 04/09/2021/3:16:51 PM    Final     Cardiac Studies     Patient Profile     [name] is a 87 y.o. male with a hx of CAD status post redo CABG, AS status post AVR in 2008, mild bilateral ICA stenosis, hypertension, hyperlipidemia, PAF and PVD who was seen 04/09/2021 for the evaluation of hypertensive urgency at the request of Dr. Bonner Puna.  Assessment & Plan    #HTN urgency Improving. Continue amlodipine 2.5mg  PO dialy Continue lasix Continue hydralazine 100mg  PO TID Change metoprolol to coreg 6.25mg  PO daily. If incomplete response to coreg, uptitrate after 2 doses.     For questions or updates, please contact [place] Please consult [email or web address] for contact info under        Signed, Vickie Epley, MD  04/10/2021, 8:55 AM

## 2021-04-10 NOTE — Care Management Obs Status (Signed)
MEDICARE OBSERVATION STATUS NOTIFICATION   Patient Details  Name: [name] MRN: [id number] Date of Birth: 02/14/1925   Medicare Observation Status Notification Given:  Yes    Gala Lewandowsky, RN 04/10/2021, 8:53 AM

## 2021-04-10 NOTE — Plan of Care (Signed)
°  Problem: Clinical Measurements: °Goal: Ability to maintain clinical measurements within normal limits will improve °Outcome: Progressing °Goal: Cardiovascular complication will be avoided °Outcome: Progressing °  °Problem: Activity: °Goal: Risk for activity intolerance will decrease °Outcome: Progressing °  °Problem: Nutrition: °Goal: Adequate nutrition will be maintained °Outcome: Progressing °  °

## 2021-04-10 NOTE — Progress Notes (Addendum)
PROGRESS NOTE  GENESIS PIRL  [id number] DOB: 12/16/1924 DOA: 04/08/2021 PCP: Haydee Salter, MD  Outpatient Specialists: Cardiology  Brief Narrative: [name] is a 87 y.o. male with a history of CAD s/p CABG, s/p bioprosthetic AVR, HTN, HLD, paroxysmal atrial fibrillation, who presented to the ED 12/8 with headache and high blood pressure (for which he was seen 12/1) now with confusion, concern for stroke. Neuroimaging was negative though he continued to have severe hypertension and headache, so was admitted. Cardiology consulted. There has been limited improvement in blood pressure, still with headache, and has history of symptoms of orthostatic hypotension with associated falls. Will continue medication titration and PT reevaluation today.  Assessment & Plan: Principal Problem:   Hypertensive urgency Active Problems:   Essential hypertension   Hyperlipidemia   Coronary artery disease   Atrial fibrillation (HCC)   Carotid artery disease (HCC)   CKD (chronic kidney disease), stage III (HCC)  HTN urgency: Previously stopped norvasc due to LE swelling (with resultant significant improvement in swelling), previously stopped other medications due to electrolyte imbalances.  - Increased hydralazine 50mg  > 100mg  TID beginning 12/9 - Changed metoprolol to coreg 12/10 - Added norvasc 2.5mg  (hx LE swelling due to this at higher dose) 12/9 - Continue lasix 20mg  daily - Reduction of BP has been near goal rate without overly aggressive reduction. Still remains above long term goal with symptoms. Making changes as above and will monitor vital signs serially. As stated, he is at high risk of falls and lives alone, all the more risk with orthostasis when correcting HTN. Will continue PT evaluations and fall precautions.  - His cardiologist has been active in titrating his medications as an outpatient, appreciate their assistance as inpatient. - Echocardiogram shows preserved LVEF, grade 1  diastolic dysfunction, severe LAE, mod RAE, well-seated AVR without leak.  NSVT: 11 beats asymptomatic overnight. K 4.2, Mg 2.0.  - Continue tele monitoring and beta blocker.  Headache: Unclear etiology, though no acute findings on CT or MRI. No findings suggestive of PRES at this time. This has been persistent, though not severe enough for the patient to request prn's at this time.  - Continue prn's and BP management as above.  - Trial tramadol. Tylenol ineffective in past. NSAID likely to exacerbate swelling and renal dysfunction  CAD s/p CABG remotely, HLD: No WMA on echo. - Continue BB, ASA, statin - Troponin checked despite no report of chest pain and slightly elevated as anticipated with HTN urgency. No further intervention currently planned.  Stage IIIb CKD: SCr roughly stable.  - Avoid nephrotoxins and relative hypotension  Paroxysmal atrial fibrillation: NSR currently. - Continue coreg in place of metop. - Not on anticoagulation due to age/fall risk.   Acute metabolic encephalopathy: Appears to have resolved with Tx severe HTN, and was mild. Negative neuroimaging. - Delirium precautions. Has not suffered severe hospital delirium thus far.  Glaucoma: No vision changes.  - Continue gtt's  Obesity: Estimated body mass index is 30.62 kg/m as calculated from the following:   Height as of this encounter: 5\' 5"  (1.651 m).   Weight as of this encounter: 83.5 kg.  Advanced age: Lives alone, does have a component of gait instability for which PT and OT recommend home health. He and his son emphasize to me that he's been falling "a lot" lately and does not have more supervision available at home, believe he would benefit from SNF rehabilitation prior to returning home. Now that BP is getting  more well regulated, will ask for PT reevaluation.   DVT prophylaxis: SCDs Code Status: DNR Family Communication: Son by phone Disposition Plan:  Status is: Observation  The patient will  require care spanning > 2 midnights and should be moved to inpatient because: Continue symptomatic HTN   Consultants:  Cardiology  Procedures:  Echocardiogram (pending)  Antimicrobials: None   Subjective: Headache is difficult to characterize, though was worse last night, better now but constant and still present. Tylenol has never worked even at high doses. Ibuprofen has helped previously. No reports of dyspnea or chest pain. Hasn't been up today but does get lightheaded upon standing at times and sometime falls as a result.  Objective: Vitals:   04/09/21 1130 04/09/21 1145  BP: (!) 168/71 (!) 174/71  Pulse: 61 60  Resp: 20 12  Temp:    TempSrc:    SpO2: 95% 95%  Weight:    Height:     Filed Weights   04/08/21 1420 04/09/21 1551  Weight: 85.5 kg 83.5 kg   Gen: Pleasant, elderly male in no distress Pulm: Nonlabored breathing room air. Clear. CV: Reg, stable soft murmur, rub, or gallop. No JVD, trace stable dependent edema. GI: Abdomen soft, non-tender, non-distended, with normoactive bowel sounds.  Ext: Warm, no deformities Skin: No new rashes, lesions or ulcers on visualized skin. Neuro: Alert and oriented. No focal neurological deficits. Psych: Judgement and insight appear fair. Mood euthymic & affect congruent. Behavior is appropriate.    Data Reviewed: I have personally reviewed following labs and imaging studies  CBC: Recent Labs  Lab 04/08/21 1432 04/08/21 1438 04/09/21 0353  WBC 6.1  --  6.3  NEUTROABS 3.3  --  3.6  HGB 12.1* 11.9* 11.6*  HCT 37.4* 35.0* 35.5*  MCV 90.8  --  89.2  PLT 163  --  0000000   Basic Metabolic Panel: Recent Labs  Lab 04/08/21 1432 04/08/21 1438 04/09/21 0353 04/10/21 0641  NA 140 142 140 138  K 4.0 4.0 4.0 4.2  CL 107 107 108 105  CO2 26  --  25 26  GLUCOSE 84 80 96 115*  BUN 21 23 22 20   CREATININE 1.42* 1.40* 1.34* 1.42*  CALCIUM 8.9  --  8.5* 8.7*  MG  --   --  2.0 2.0  PHOS  --   --  3.4  --    GFR: Estimated  Creatinine Clearance: 31.6 mL/min (A) (by C-G formula based on SCr of 1.42 mg/dL (H)). Liver Function Tests: Recent Labs  Lab 04/08/21 1432 04/09/21 0353  AST 20 18  ALT 14 16  ALKPHOS 64 55  BILITOT 0.5 0.8  PROT 6.0* 5.8*  ALBUMIN 3.3* 3.0*   No results for input(s): LIPASE, AMYLASE in the last 168 hours. No results for input(s): AMMONIA in the last 168 hours. Coagulation Profile: Recent Labs  Lab 04/08/21 1432  INR 0.9   Cardiac Enzymes: No results for input(s): CKTOTAL, CKMB, CKMBINDEX, TROPONINI in the last 168 hours. BNP (last 3 results) No results for input(s): PROBNP in the last 8760 hours. HbA1C: No results for input(s): HGBA1C in the last 72 hours. CBG: No results for input(s): GLUCAP in the last 168 hours. Lipid Profile: Recent Labs    04/09/21 0353  CHOL 150  HDL 39*  LDLCALC 77  TRIG 170*  CHOLHDL 3.8   Thyroid Function Tests: Recent Labs    04/09/21 0353  TSH 5.903*  FREET4 0.87   Anemia Panel: No results for input(s): VITAMINB12,  FOLATE, FERRITIN, TIBC, IRON, RETICCTPCT in the last 72 hours. Urine analysis:    Component Value Date/Time   COLORURINE YELLOW 05/31/2007 [place] 05/31/2007 1055   LABSPEC 1.018 05/31/2007 1055   PHURINE 5.5 05/31/2007 1055   GLUCOSEU NEGATIVE 05/31/2007 1055   HGBUR MODERATE (A) 05/31/2007 1055   BILIRUBINUR NEGATIVE 05/31/2007 1055   KETONESUR NEGATIVE 05/31/2007 1055   PROTEINUR NEGATIVE 05/31/2007 1055   UROBILINOGEN 0.2 05/31/2007 1055   NITRITE NEGATIVE 05/31/2007 1055   LEUKOCYTESUR NEGATIVE 05/31/2007 1055   Recent Results (from the past 240 hour(s))  Resp Panel by RT-PCR (Flu A&B, Covid) Nasopharyngeal Swab     Status: None   Collection Time: 04/08/21  3:05 PM   Specimen: Nasopharyngeal Swab; Nasopharyngeal(NP) swabs in vial transport medium  Result Value Ref Range Status   SARS Coronavirus 2 by RT PCR NEGATIVE NEGATIVE Final    Comment: (NOTE) SARS-CoV-2 target nucleic acids  are NOT DETECTED.  The SARS-CoV-2 RNA is generally detectable in upper respiratory specimens during the acute phase of infection. The lowest concentration of SARS-CoV-2 viral copies this assay can detect is 138 copies/mL. A negative result does not preclude SARS-Cov-2 infection and should not be used as the sole basis for treatment or other patient management decisions. A negative result may occur with  improper specimen collection/handling, submission of specimen other than nasopharyngeal swab, presence of viral mutation(s) within the areas targeted by this assay, and inadequate number of viral copies(<138 copies/mL). A negative result must be combined with clinical observations, patient history, and epidemiological information. The expected result is Negative.  Fact Sheet for Patients:  EntrepreneurPulse.com.au  Fact Sheet for Healthcare Providers:  IncredibleEmployment.be  This test is no t yet approved or cleared by the [place] FDA and  has been authorized for detection and/or diagnosis of SARS-CoV-2 by FDA under an Emergency Use Authorization (EUA). This EUA will remain  in effect (meaning this test can be used) for the duration of the COVID-19 declaration under Section 564(b)(1) of the Act, 21 U.S.C.section 360bbb-3(b)(1), unless the authorization is terminated  or revoked sooner.       Influenza A by PCR NEGATIVE NEGATIVE Final   Influenza B by PCR NEGATIVE NEGATIVE Final    Comment: (NOTE) The Xpert Xpress SARS-CoV-2/FLU/RSV plus assay is intended as an aid in the diagnosis of influenza from Nasopharyngeal swab specimens and should not be used as a sole basis for treatment. Nasal washings and aspirates are unacceptable for Xpert Xpress SARS-CoV-2/FLU/RSV testing.  Fact Sheet for Patients: EntrepreneurPulse.com.au  Fact Sheet for Healthcare Providers: IncredibleEmployment.be  This test is  not yet approved or cleared by the [place] FDA and has been authorized for detection and/or diagnosis of SARS-CoV-2 by FDA under an Emergency Use Authorization (EUA). This EUA will remain in effect (meaning this test can be used) for the duration of the COVID-19 declaration under Section 564(b)(1) of the Act, 21 U.S.C. section 360bbb-3(b)(1), unless the authorization is terminated or revoked.  Performed at [facility], [place] [address] [place],  [address]       Radiology Studies: CT HEAD WO CONTRAST  Result Date: 04/08/2021 CLINICAL DATA:  Acute neuro deficit.  Right-sided headache 2 weeks EXAM: CT HEAD WITHOUT CONTRAST TECHNIQUE: Contiguous axial images were obtained from the base of the skull through the vertex without intravenous contrast. COMPARISON:  CT head 04/02/2021 FINDINGS: Brain: Generalized atrophy. Patchy white matter hypodensity bilaterally unchanged. Negative for acute infarct, hemorrhage, mass Vascular: Negative for  hyperdense vessel Skull: Negative Sinuses/Orbits: Negative Other: None IMPRESSION: Atrophy and mild chronic microvascular ischemic change in the white matter. No acute abnormality Electronically Signed   By: Marlan Palau M.D.   On: 04/08/2021 15:01   MR Brain W and Wo Contrast  Result Date: 04/08/2021 CLINICAL DATA:  Initial evaluation for acute headache. EXAM: MRI HEAD WITHOUT AND WITH CONTRAST TECHNIQUE: Multiplanar, multiecho pulse sequences of the brain and surrounding structures were obtained without and with intravenous contrast. CONTRAST:  8.71mL GADAVIST GADOBUTROL 1 MMOL/ML IV SOLN COMPARISON:  CT from earlier the same day. FINDINGS: Brain: Age-related cerebral atrophy. Patchy T2/FLAIR hyperintensity involving the periventricular deep white matter both cerebral hemispheres most consistent with chronic small vessel ischemic disease, mild to moderate in nature. Small remote left cerebellar infarct noted. No abnormal foci of restricted  diffusion to suggest acute or subacute ischemia. Gray-white matter differentiation otherwise maintained. No encephalomalacia to suggest chronic cortical infarction. No acute or chronic intracranial hemorrhage. No mass lesion, midline shift or mass effect. No hydrocephalus or extra-axial fluid collection. Pituitary gland suprasellar region within normal limits. Midline structures intact and normal. No abnormal enhancement. Vascular: Major intracranial vascular flow voids are maintained. Skull and upper cervical spine: Craniocervical junction within normal limits. Bone marrow signal intensity normal. No scalp soft tissue abnormality. Sinuses/Orbits: Patient status post bilateral ocular lens replacement. Globes orbital soft tissues demonstrate no acute finding. Mild scattered mucosal thickening noted within the sphenoid ethmoidal sinuses. Paranasal sinuses are otherwise clear. No mastoid effusion. Inner ear structures grossly normal. Other: None. IMPRESSION: 1. No acute intracranial abnormality. 2. Age-related cerebral atrophy with mild to moderate chronic small vessel ischemic disease. Electronically Signed   By: Rise Mu M.D.   On: 04/08/2021 21:10   DG CHEST PORT 1 VIEW  Result Date: 04/09/2021 CLINICAL DATA:  Headache. EXAM: PORTABLE CHEST 1 VIEW COMPARISON:  06/28/2007. FINDINGS: The heart is enlarged and there is evidence of prior cardiothoracic surgery. A prosthetic cardiac valve is noted. There is atherosclerotic calcification of the aorta. Calcified pleural plaques are noted in the mid to lower lung fields bilaterally. There is mild airspace disease at the lung bases bilaterally. No effusion or pneumothorax. No acute osseous abnormality. IMPRESSION: 1. Mild atelectasis or infiltrate at the lung bases. 2. Cardiomegaly. Electronically Signed   By: Thornell Sartorius M.D.   On: 04/09/2021 01:40   ECHOCARDIOGRAM COMPLETE  Result Date: 04/09/2021    ECHOCARDIOGRAM REPORT   Patient Name:   HUIE GHUMAN Date of Exam: 04/09/2021 Medical Rec #:  [id number]       Height:       65.0 in Accession #:    [id number]      Weight:       188.5 lb Date of Birth:  1925-03-06       BSA:          1.929 m Patient Age:    89 years        BP:           177/67 mmHg Patient Gender: M               HR:           69 bpm. Exam Location:  Inpatient Procedure: 2D Echo, Cardiac Doppler and Color Doppler Indications:    TIA  History:        Patient has prior history of Echocardiogram examinations, most  recent 01/13/2011. CAD, Arrythmias:Atrial Fibrillation; Risk                 Factors:Hypertension and Dyslipidemia.                 Aortic Valve: 23 mm Edwards bovine valve is present in the                 aortic position. Procedure Date: 2009.  Sonographer:    Bernadene Person RDCS Referring Phys: [place]  1. Left ventricular ejection fraction, by estimation, is 50 to 55%. The left ventricle has low normal function. The left ventricle has no regional wall motion abnormalities. There is mild left ventricular hypertrophy. Left ventricular diastolic parameters are consistent with Grade I diastolic dysfunction (impaired relaxation).  2. Right ventricular systolic function is mildly reduced. The right ventricular size is normal. There is normal pulmonary artery systolic pressure.  3. Left atrial size was severely dilated.  4. Right atrial size was moderately dilated.  5. The mitral valve is grossly normal. Mild mitral valve regurgitation.  6. Prosthetic valve is well seated, no paravalvular leak. AV max 2.4 m/s. The aortic valve is grossly normal. Aortic valve regurgitation is not visualized. No aortic stenosis is present. There is a 23 mm Edwards bovine valve present in the aortic position. Procedure Date: 2009.  7. The inferior vena cava is normal in size with greater than 50% respiratory variability, suggesting right atrial pressure of 3 mmHg. Comparison(s): No prior Echocardiogram.  Conclusion(s)/Recommendation(s): Otherwise normal echocardiogram, with minor abnormalities described in the report. FINDINGS  Left Ventricle: Left ventricular ejection fraction, by estimation, is 50 to 55%. The left ventricle has low normal function. The left ventricle has no regional wall motion abnormalities. The left ventricular internal cavity size was normal in size. There is mild left ventricular hypertrophy. Left ventricular diastolic parameters are consistent with Grade I diastolic dysfunction (impaired relaxation). Right Ventricle: The right ventricular size is normal. No increase in right ventricular wall thickness. Right ventricular systolic function is mildly reduced. There is normal pulmonary artery systolic pressure. The tricuspid regurgitant velocity is 1.30 m/s, and with an assumed right atrial pressure of 3 mmHg, the estimated right ventricular systolic pressure is 9.8 mmHg. Left Atrium: Left atrial size was severely dilated. Right Atrium: Right atrial size was moderately dilated. Pericardium: There is no evidence of pericardial effusion. Mitral Valve: The mitral valve is grossly normal. Mild mitral valve regurgitation. Tricuspid Valve: The tricuspid valve is grossly normal. Tricuspid valve regurgitation is trivial. Aortic Valve: Prosthetic valve is well seated, no paravalvular leak. AV max 2.4 m/s. The aortic valve is grossly normal. Aortic valve regurgitation is not visualized. No aortic stenosis is present. Aortic valve mean gradient measures 12.5 mmHg. Aortic valve peak gradient measures 23.5 mmHg. Aortic valve area, by VTI measures 1.35 cm. There is a 23 mm Edwards bovine valve present in the aortic position. Procedure Date: 2009. Pulmonic Valve: The pulmonic valve was grossly normal. Pulmonic valve regurgitation is mild. Aorta: The aortic root and ascending aorta are structurally normal, with no evidence of dilitation. Venous: The inferior vena cava is normal in size with greater than 50%  respiratory variability, suggesting right atrial pressure of 3 mmHg. IAS/Shunts: No atrial level shunt detected by color flow Doppler.  LEFT VENTRICLE PLAX 2D LVIDd:         5.00 cm     Diastology LVIDs:         3.90 cm     LV  e' medial:    3.44 cm/s LV PW:         1.40 cm     LV E/e' medial:  41.6 LV IVS:        1.20 cm     LV e' lateral:   7.75 cm/s LVOT diam:     2.00 cm     LV E/e' lateral: 18.5 LV SV:         72 LV SV Index:   37 LVOT Area:     3.14 cm  LV Volumes (MOD) LV vol d, MOD A2C: 82.9 ml LV vol d, MOD A4C: 74.0 ml LV vol s, MOD A2C: 38.6 ml LV vol s, MOD A4C: 35.6 ml LV SV MOD A2C:     44.3 ml LV SV MOD A4C:     74.0 ml LV SV MOD BP:      41.2 ml RIGHT VENTRICLE RV S prime:     5.06 cm/s TAPSE (M-mode): 1.1 cm LEFT ATRIUM             Index        RIGHT ATRIUM           Index LA diam:        4.60 cm 2.38 cm/m   RA Area:     21.10 cm LA Vol (A2C):   77.3 ml 40.07 ml/m  RA Volume:   60.60 ml  31.42 ml/m LA Vol (A4C):   85.1 ml 44.12 ml/m LA Biplane Vol: 82.6 ml 42.82 ml/m  AORTIC VALVE AV Area (Vmax):    1.30 cm AV Area (Vmean):   1.31 cm AV Area (VTI):     1.35 cm AV Vmax:           242.50 cm/s AV Vmean:          163.000 cm/s AV VTI:            0.535 m AV Peak Grad:      23.5 mmHg AV Mean Grad:      12.5 mmHg LVOT Vmax:         100.00 cm/s LVOT Vmean:        67.800 cm/s LVOT VTI:          0.229 m LVOT/AV VTI ratio: 0.43  AORTA Ao Root diam: 3.10 cm Ao Asc diam:  3.60 cm MITRAL VALVE                TRICUSPID VALVE MV Area (PHT): 3.42 cm     TR Peak grad:   6.8 mmHg MV Decel Time: 222 msec     TR Vmax:        130.00 cm/s MV E velocity: 143.00 cm/s MV A velocity: 133.00 cm/s  SHUNTS MV E/A ratio:  1.08         Systemic VTI:  0.23 m                             Systemic Diam: 2.00 cm Phineas Inches Electronically signed by Phineas Inches Signature Date/Time: 04/09/2021/3:16:51 PM    Final     Scheduled Meds:  acetaminophen  1,000 mg Oral Once   amLODipine  2.5 mg Oral Daily   aspirin  81 mg Oral  Daily   atorvastatin  40 mg Oral Daily   brimonidine  1 drop Left Eye BID   And   timolol  1 drop Left Eye BID   carvedilol  6.25  mg Oral BID WC   furosemide  20 mg Oral Daily   hydrALAZINE  100 mg Oral TID   latanoprost  1 drop Left Eye QHS   sodium chloride flush  3 mL Intravenous Q12H   Continuous Infusions:  sodium chloride       LOS: 0 days   Time spent: 25 minutes.  Patrecia Pour, MD [facility] [email or web address] 04/10/2021, 9:58 AM

## 2021-04-11 DIAGNOSIS — I779 Disorder of arteries and arterioles, unspecified: Secondary | ICD-10-CM | POA: Diagnosis not present

## 2021-04-11 DIAGNOSIS — I160 Hypertensive urgency: Secondary | ICD-10-CM | POA: Diagnosis not present

## 2021-04-11 DIAGNOSIS — I480 Paroxysmal atrial fibrillation: Secondary | ICD-10-CM | POA: Diagnosis not present

## 2021-04-11 DIAGNOSIS — N1831 Chronic kidney disease, stage 3a: Secondary | ICD-10-CM | POA: Diagnosis not present

## 2021-04-11 MED ORDER — HYDRALAZINE HCL 50 MG PO TABS
50.0000 mg | ORAL_TABLET | Freq: Three times a day (TID) | ORAL | Status: DC
  Administered 2021-04-11 – 2021-04-12 (×2): 50 mg via ORAL
  Filled 2021-04-11 (×2): qty 1

## 2021-04-11 MED ORDER — LORATADINE 10 MG PO TABS: 10.0000 mg | ORAL_TABLET | Freq: Every day | ORAL | Status: DC

## 2021-04-11 MED ORDER — LORATADINE 10 MG PO TABS
10.0000 mg | ORAL_TABLET | Freq: Every day | ORAL | Status: DC | PRN
  Filled 2021-04-11: qty 1

## 2021-04-11 NOTE — Progress Notes (Signed)
PROGRESS NOTE  [name]  ZWC:[id number] DOB: 1924-10-02 DOA: 04/08/2021 PCP: Loyola Mast, MD  Outpatient Specialists: Cardiology  Brief Narrative: [name] is a 87 y.o. male with a history of CAD s/p CABG, s/p bioprosthetic AVR, HTN, HLD, paroxysmal atrial fibrillation, who presented to the ED 12/8 with headache and high blood pressure (for which he was seen 12/1) now with confusion, concern for stroke. Neuroimaging was negative though he continued to have severe hypertension and headache, so was admitted. Cardiology consulted. There has been limited improvement in blood pressure, still with headache, and has history of symptoms of orthostatic hypotension with associated falls. Will continue medication titration and PT reevaluation.  Assessment & Plan: Principal Problem:   Hypertensive urgency Active Problems:   Essential hypertension   Hyperlipidemia   Coronary artery disease   Atrial fibrillation (HCC)   Carotid artery disease (HCC)   CKD (chronic kidney disease), stage III (HCC)  HTN urgency: Previously stopped norvasc due to LE swelling (with resultant significant improvement in swelling), previously stopped other medications due to electrolyte imbalances.  - Increased hydralazine 50mg  > 100mg  TID beginning 12/9 - Changed metoprolol to coreg 12/10 - Added norvasc 2.5mg  (hx LE swelling due to this at higher dose) 12/9 - Continue lasix 20mg  daily. Leg swelling significantly improved. - Will continue these medications and check orthostatics today. Will continue PT evaluations and fall precautions.  - His cardiologist has been active in titrating his medications as an outpatient, appreciate their assistance as inpatient. - Echocardiogram shows preserved LVEF, grade 1 diastolic dysfunction, severe LAE, mod RAE, well-seated AVR without leak.  NSVT: Asymptomatic, not persistent at this time. - Continue tele monitoring and beta blocker.  Headache: Unclear etiology, though  no acute findings on CT or MRI. No findings suggestive of PRES at this time. This has been persistent, though not severe enough for the patient to request prn's at this time. No significant sinus disease on neuroimaging. - Continue prn's and BP management as above. Improved overall. - Trial tramadol. Tylenol ineffective in past. NSAID likely to exacerbate swelling and renal dysfunction  CAD s/p CABG remotely, HLD: No WMA on echo. - Continue BB, ASA, statin - Troponin checked despite no report of chest pain and slightly elevated as anticipated with HTN urgency. No further intervention currently planned.  Stage IIIb CKD: SCr roughly stable.  - Avoid nephrotoxins and relative hypotension  Paroxysmal atrial fibrillation: NSR currently. - Continue coreg in place of metop. - Not on anticoagulation due to age/fall risk.   Acute metabolic encephalopathy: Appears to have resolved with Tx severe HTN, and was mild. Negative neuroimaging. - Delirium precautions. Has not suffered severe hospital delirium thus far.  Glaucoma: No vision changes.  - Continue gtt's  Obesity: Estimated body mass index is 30.62 kg/m as calculated from the following:   Height as of this encounter: 5\' 5"  (1.651 m).   Weight as of this encounter: 83.5 kg.  Advanced age: Lives alone, does have a component of gait instability for which PT and OT recommend home health. He and his son emphasize to me that he's been falling "a lot" lately and does not have more supervision available at home, believe he would benefit from SNF rehabilitation prior to returning home. Now that BP is getting more well regulated, will ask for PT reevaluation.   Constipation: Chronic, controlled on his home miralax.  DVT prophylaxis: SCDs Code Status: DNR Family Communication: Son at bedside daily Disposition Plan:  Status is: Observation, remaining  inpatient due to pt/son perception of unsafe discharge, request PT reevaluation for consideration of  SNF.  Consultants:  Cardiology  Procedures:  Echocardiogram   Antimicrobials: None   Subjective: Headache comes and goes, currently improved and overall less intense than at admission. No new weakness or numbness. Says he didn't feel dizzy/lightheaded last time he stood up. No chest pain.   Objective: BP (!) 135/56 (BP Location: Left Arm)   Pulse 67   Temp 98 F (36.7 C) (Oral)   Resp 20   Ht 5\' 5"  (1.651 m)   Wt 83.5 kg   SpO2 97%   BMI 30.62 kg/m   Gen: Pleasant, interactive elderly male in no distress Pulm: Nonlabored breathing room air. Clear. CV: Regular rate and rhythm. No murmur, rub, or gallop. No JVD, minimal, significantly improved dependent edema. GI: Abdomen soft, non-tender, non-distended, with normoactive bowel sounds.  Ext: Warm, no deformities Skin: No rashes, lesions or ulcers on visualized skin. Neuro: Alert and oriented. No focal neurological deficits. Psych: Judgement and insight appear fair. Mood euthymic & affect congruent. Behavior is appropriate.    Data Reviewed: I have personally reviewed following labs and imaging studies  CBC: Recent Labs  Lab 04/08/21 1432 04/08/21 1438 04/09/21 0353  WBC 6.1  --  6.3  NEUTROABS 3.3  --  3.6  HGB 12.1* 11.9* 11.6*  HCT 37.4* 35.0* 35.5*  MCV 90.8  --  89.2  PLT 163  --  0000000   Basic Metabolic Panel: Recent Labs  Lab 04/08/21 1432 04/08/21 1438 04/09/21 0353 04/10/21 0641  NA 140 142 140 138  K 4.0 4.0 4.0 4.2  CL 107 107 108 105  CO2 26  --  25 26  GLUCOSE 84 80 96 115*  BUN 21 23 22 20   CREATININE 1.42* 1.40* 1.34* 1.42*  CALCIUM 8.9  --  8.5* 8.7*  MG  --   --  2.0 2.0  PHOS  --   --  3.4  --    GFR: Estimated Creatinine Clearance: 31.6 mL/min (A) (by C-G formula based on SCr of 1.42 mg/dL (H)). Liver Function Tests: Recent Labs  Lab 04/08/21 1432 04/09/21 0353  AST 20 18  ALT 14 16  ALKPHOS 64 55  BILITOT 0.5 0.8  PROT 6.0* 5.8*  ALBUMIN 3.3* 3.0*   Coagulation  Profile: Recent Labs  Lab 04/08/21 1432  INR 0.9   Lipid Profile: Recent Labs    04/09/21 0353  CHOL 150  HDL 39*  LDLCALC 77  TRIG 170*  CHOLHDL 3.8   Thyroid Function Tests: Recent Labs    04/09/21 0353  TSH 5.903*  FREET4 0.87    Recent Results (from the past 240 hour(s))  Resp Panel by RT-PCR (Flu A&B, Covid) Nasopharyngeal Swab     Status: None   Collection Time: 04/08/21  3:05 PM   Specimen: Nasopharyngeal Swab; Nasopharyngeal(NP) swabs in vial transport medium  Result Value Ref Range Status   SARS Coronavirus 2 by RT PCR NEGATIVE NEGATIVE Final   Influenza A by PCR NEGATIVE NEGATIVE Final   Influenza B by PCR NEGATIVE NEGATIVE Final      Radiology Studies: ECHOCARDIOGRAM COMPLETE  Result Date: 04/09/2021    ECHOCARDIOGRAM REPORT   Patient Name:   PHILL MATHWIG Date of Exam: 04/09/2021 Medical Rec #:  [id number]       Height:       65.0 in Accession #:    [id number]      Weight:  188.5 lb Date of Birth:  [birth year]       BSA:          1.929 m Patient Age:    52 years        BP:           177/67 mmHg Patient Gender: M               HR:           69 bpm. Exam Location:  Inpatient Procedure: 2D Echo, Cardiac Doppler and Color Doppler Indications:    TIA  History:        Patient has prior history of Echocardiogram examinations, most                 recent 01/13/2011. CAD, Arrythmias:Atrial Fibrillation; Risk                 Factors:Hypertension and Dyslipidemia.                 Aortic Valve: 23 mm Edwards bovine valve is present in the                 aortic position. Procedure Date: 2009.  Sonographer:    Bernadene Person RDCS Referring Phys: [place]  1. Left ventricular ejection fraction, by estimation, is 50 to 55%. The left ventricle has low normal function. The left ventricle has no regional wall motion abnormalities. There is mild left ventricular hypertrophy. Left ventricular diastolic parameters are consistent with Grade I diastolic  dysfunction (impaired relaxation).  2. Right ventricular systolic function is mildly reduced. The right ventricular size is normal. There is normal pulmonary artery systolic pressure.  3. Left atrial size was severely dilated.  4. Right atrial size was moderately dilated.  5. The mitral valve is grossly normal. Mild mitral valve regurgitation.  6. Prosthetic valve is well seated, no paravalvular leak. AV max 2.4 m/s. The aortic valve is grossly normal. Aortic valve regurgitation is not visualized. No aortic stenosis is present. There is a 23 mm Edwards bovine valve present in the aortic position. Procedure Date: 2009.  7. The inferior vena cava is normal in size with greater than 50% respiratory variability, suggesting right atrial pressure of 3 mmHg. Comparison(s): No prior Echocardiogram. Conclusion(s)/Recommendation(s): Otherwise normal echocardiogram, with minor abnormalities described in the report. FINDINGS  Left Ventricle: Left ventricular ejection fraction, by estimation, is 50 to 55%. The left ventricle has low normal function. The left ventricle has no regional wall motion abnormalities. The left ventricular internal cavity size was normal in size. There is mild left ventricular hypertrophy. Left ventricular diastolic parameters are consistent with Grade I diastolic dysfunction (impaired relaxation). Right Ventricle: The right ventricular size is normal. No increase in right ventricular wall thickness. Right ventricular systolic function is mildly reduced. There is normal pulmonary artery systolic pressure. The tricuspid regurgitant velocity is 1.30 m/s, and with an assumed right atrial pressure of 3 mmHg, the estimated right ventricular systolic pressure is 9.8 mmHg. Left Atrium: Left atrial size was severely dilated. Right Atrium: Right atrial size was moderately dilated. Pericardium: There is no evidence of pericardial effusion. Mitral Valve: The mitral valve is grossly normal. Mild mitral valve  regurgitation. Tricuspid Valve: The tricuspid valve is grossly normal. Tricuspid valve regurgitation is trivial. Aortic Valve: Prosthetic valve is well seated, no paravalvular leak. AV max 2.4 m/s. The aortic valve is grossly normal. Aortic valve regurgitation is not visualized. No aortic stenosis is present. Aortic valve mean  gradient measures 12.5 mmHg. Aortic valve peak gradient measures 23.5 mmHg. Aortic valve area, by VTI measures 1.35 cm. There is a 23 mm Edwards bovine valve present in the aortic position. Procedure Date: 2009. Pulmonic Valve: The pulmonic valve was grossly normal. Pulmonic valve regurgitation is mild. Aorta: The aortic root and ascending aorta are structurally normal, with no evidence of dilitation. Venous: The inferior vena cava is normal in size with greater than 50% respiratory variability, suggesting right atrial pressure of 3 mmHg. IAS/Shunts: No atrial level shunt detected by color flow Doppler.  LEFT VENTRICLE PLAX 2D LVIDd:         5.00 cm     Diastology LVIDs:         3.90 cm     LV e' medial:    3.44 cm/s LV PW:         1.40 cm     LV E/e' medial:  41.6 LV IVS:        1.20 cm     LV e' lateral:   7.75 cm/s LVOT diam:     2.00 cm     LV E/e' lateral: 18.5 LV SV:         72 LV SV Index:   37 LVOT Area:     3.14 cm  LV Volumes (MOD) LV vol d, MOD A2C: 82.9 ml LV vol d, MOD A4C: 74.0 ml LV vol s, MOD A2C: 38.6 ml LV vol s, MOD A4C: 35.6 ml LV SV MOD A2C:     44.3 ml LV SV MOD A4C:     74.0 ml LV SV MOD BP:      41.2 ml RIGHT VENTRICLE RV S prime:     5.06 cm/s TAPSE (M-mode): 1.1 cm LEFT ATRIUM             Index        RIGHT ATRIUM           Index LA diam:        4.60 cm 2.38 cm/m   RA Area:     21.10 cm LA Vol (A2C):   77.3 ml 40.07 ml/m  RA Volume:   60.60 ml  31.42 ml/m LA Vol (A4C):   85.1 ml 44.12 ml/m LA Biplane Vol: 82.6 ml 42.82 ml/m  AORTIC VALVE AV Area (Vmax):    1.30 cm AV Area (Vmean):   1.31 cm AV Area (VTI):     1.35 cm AV Vmax:           242.50 cm/s AV Vmean:           163.000 cm/s AV VTI:            0.535 m AV Peak Grad:      23.5 mmHg AV Mean Grad:      12.5 mmHg LVOT Vmax:         100.00 cm/s LVOT Vmean:        67.800 cm/s LVOT VTI:          0.229 m LVOT/AV VTI ratio: 0.43  AORTA Ao Root diam: 3.10 cm Ao Asc diam:  3.60 cm MITRAL VALVE                TRICUSPID VALVE MV Area (PHT): 3.42 cm     TR Peak grad:   6.8 mmHg MV Decel Time: 222 msec     TR Vmax:        130.00 cm/s MV E velocity: 143.00 cm/s MV A velocity:  133.00 cm/s  SHUNTS MV E/A ratio:  1.08         Systemic VTI:  0.23 m                             Systemic Diam: 2.00 cm Phineas Inches Electronically signed by Phineas Inches Signature Date/Time: 04/09/2021/3:16:51 PM    Final     Scheduled Meds:  acetaminophen  1,000 mg Oral Once   amLODipine  2.5 mg Oral Daily   aspirin  81 mg Oral Daily   atorvastatin  40 mg Oral Daily   brimonidine  1 drop Left Eye BID   And   timolol  1 drop Left Eye BID   carvedilol  6.25 mg Oral BID WC   furosemide  20 mg Oral Daily   hydrALAZINE  100 mg Oral TID   latanoprost  1 drop Left Eye QHS   polyethylene glycol  17 g Oral Daily   sodium chloride flush  3 mL Intravenous Q12H   Continuous Infusions:  sodium chloride       LOS: 0 days   Time spent: 25 minutes.  Patrecia Pour, MD [facility] [email or web address] 04/11/2021, 10:43 AM

## 2021-04-11 NOTE — Progress Notes (Signed)
PT Cancellation Note  Patient Details Name: [name] MRN: [id number] DOB: 09/30/1924   Cancelled Treatment:    Reason Eval/Treat Not Completed: Medical issues which prohibited therapy. RN reporting pt displayed symptomatic orthostatic hypotension earlier and that RN had coordinated with MD in regards to MD being ok for PT to hold today due to this. Will likely get a better assessment of pt's capabilities tomorrow provided his orthostatic hypotension is resolved then. PT will hold today and plan to follow-up tomorrow.   Raymond Gurney, PT, DPT Acute Rehabilitation Services  Pager: [phone] Office: [phone]    Jewel Baize 04/11/2021, 4:42 PM

## 2021-04-11 NOTE — Progress Notes (Signed)
Progress Note  Patient Name: [name] Date of Encounter: 04/11/2021  [facility] Cardiologist: Peter Martinique, MD   Subjective   NAEO. Son at bedside- updated.   Inpatient Medications    Scheduled Meds:  acetaminophen  1,000 mg Oral Once   amLODipine  2.5 mg Oral Daily   aspirin  81 mg Oral Daily   atorvastatin  40 mg Oral Daily   brimonidine  1 drop Left Eye BID   And   timolol  1 drop Left Eye BID   carvedilol  6.25 mg Oral BID WC   furosemide  20 mg Oral Daily   hydrALAZINE  100 mg Oral TID   latanoprost  1 drop Left Eye QHS   polyethylene glycol  17 g Oral Daily   sodium chloride flush  3 mL Intravenous Q12H   Continuous Infusions:  sodium chloride     PRN Meds: sodium chloride, acetaminophen **OR** acetaminophen, cloNIDine, loratadine, sodium chloride flush, traMADol   Vital Signs    Vitals:   04/10/21 1425 04/10/21 1825 04/10/21 2002 04/11/21 0300  BP: (!) 113/50 (!) 122/55 (!) 135/58 (!) 135/56  Pulse: 73  67 67  Resp: 20  20 20   Temp: 98.5 F (36.9 C)  98.5 F (36.9 C) 98 F (36.7 C)  TempSrc: Oral  Oral Oral  SpO2: 97%     Weight:      Height:       No intake or output data in the 24 hours ending 04/11/21 1013  Last 3 Weights 04/09/2021 04/08/2021 03/18/2021  Weight (lbs) 184 lb 188 lb 7.9 oz 188 lb 9.6 oz  Weight (kg) 83.462 kg 85.5 kg 85.548 kg      Telemetry    reviewed - Personally Reviewed  ECG    Personally Reviewed  Physical Exam   GEN: No acute distress.  Elderly male in bed. Neck: No JVD Cardiac: RRR, no murmurs, rubs, or gallops.  Respiratory: Clear to auscultation bilaterally. GI: Soft, nontender, non-distended  MS: No edema; No deformity. Neuro:  Nonfocal  Psych: Normal affect   Labs    High Sensitivity Troponin:   Recent Labs  Lab 04/09/21 0353  TROPONINIHS 28*      Chemistry Recent Labs  Lab 04/08/21 1432 04/08/21 1438 04/09/21 0353 04/10/21 0641  NA 140 142 140 138  K 4.0 4.0 4.0 4.2  CL  107 107 108 105  CO2 26  --  25 26  GLUCOSE 84 80 96 115*  BUN 21 23 22 20   CREATININE 1.42* 1.40* 1.34* 1.42*  CALCIUM 8.9  --  8.5* 8.7*  MG  --   --  2.0 2.0  PROT 6.0*  --  5.8*  --   ALBUMIN 3.3*  --  3.0*  --   AST 20  --  18  --   ALT 14  --  16  --   ALKPHOS 64  --  55  --   BILITOT 0.5  --  0.8  --   GFRNONAA 46*  --  49* 46*  ANIONGAP 7  --  7 7     Lipids  Recent Labs  Lab 04/09/21 0353  CHOL 150  TRIG 170*  HDL 39*  LDLCALC 77  CHOLHDL 3.8     Hematology Recent Labs  Lab 04/08/21 1432 04/08/21 1438 04/09/21 0353  WBC 6.1  --  6.3  RBC 4.12*  --  3.98*  HGB 12.1* 11.9* 11.6*  HCT 37.4* 35.0* 35.5*  MCV 90.8  --  89.2  MCH 29.4  --  29.1  MCHC 32.4  --  32.7  RDW 14.9  --  14.9  PLT 163  --  162    Thyroid  Recent Labs  Lab 04/09/21 0353  TSH 5.903*  FREET4 0.87     BNP Recent Labs  Lab 04/09/21 0353  BNP 448.2*     DDimer No results for input(s): DDIMER in the last 168 hours.   Radiology    ECHOCARDIOGRAM COMPLETE  Result Date: 04/09/2021    ECHOCARDIOGRAM REPORT   Patient Name:   [name] Date of Exam: 04/09/2021 Medical Rec #:  [id number]       Height:       65.0 in Accession #:    [id number]      Weight:       188.5 lb Date of Birth:  01-Oct-1924       BSA:          1.929 m Patient Age:    4 years        BP:           177/67 mmHg Patient Gender: M               HR:           69 bpm. Exam Location:  Inpatient Procedure: 2D Echo, Cardiac Doppler and Color Doppler Indications:    TIA  History:        Patient has prior history of Echocardiogram examinations, most                 recent 01/13/2011. CAD, Arrythmias:Atrial Fibrillation; Risk                 Factors:Hypertension and Dyslipidemia.                 Aortic Valve: 23 mm Edwards bovine valve is present in the                 aortic position. Procedure Date: 2009.  Sonographer:    Bernadene Person RDCS Referring Phys: [place]  1. Left ventricular ejection  fraction, by estimation, is 50 to 55%. The left ventricle has low normal function. The left ventricle has no regional wall motion abnormalities. There is mild left ventricular hypertrophy. Left ventricular diastolic parameters are consistent with Grade I diastolic dysfunction (impaired relaxation).  2. Right ventricular systolic function is mildly reduced. The right ventricular size is normal. There is normal pulmonary artery systolic pressure.  3. Left atrial size was severely dilated.  4. Right atrial size was moderately dilated.  5. The mitral valve is grossly normal. Mild mitral valve regurgitation.  6. Prosthetic valve is well seated, no paravalvular leak. AV max 2.4 m/s. The aortic valve is grossly normal. Aortic valve regurgitation is not visualized. No aortic stenosis is present. There is a 23 mm Edwards bovine valve present in the aortic position. Procedure Date: 2009.  7. The inferior vena cava is normal in size with greater than 50% respiratory variability, suggesting right atrial pressure of 3 mmHg. Comparison(s): No prior Echocardiogram. Conclusion(s)/Recommendation(s): Otherwise normal echocardiogram, with minor abnormalities described in the report. FINDINGS  Left Ventricle: Left ventricular ejection fraction, by estimation, is 50 to 55%. The left ventricle has low normal function. The left ventricle has no regional wall motion abnormalities. The left ventricular internal cavity size was normal in size. There is mild left ventricular hypertrophy. Left ventricular diastolic parameters  are consistent with Grade I diastolic dysfunction (impaired relaxation). Right Ventricle: The right ventricular size is normal. No increase in right ventricular wall thickness. Right ventricular systolic function is mildly reduced. There is normal pulmonary artery systolic pressure. The tricuspid regurgitant velocity is 1.30 m/s, and with an assumed right atrial pressure of 3 mmHg, the estimated right ventricular systolic  pressure is 9.8 mmHg. Left Atrium: Left atrial size was severely dilated. Right Atrium: Right atrial size was moderately dilated. Pericardium: There is no evidence of pericardial effusion. Mitral Valve: The mitral valve is grossly normal. Mild mitral valve regurgitation. Tricuspid Valve: The tricuspid valve is grossly normal. Tricuspid valve regurgitation is trivial. Aortic Valve: Prosthetic valve is well seated, no paravalvular leak. AV max 2.4 m/s. The aortic valve is grossly normal. Aortic valve regurgitation is not visualized. No aortic stenosis is present. Aortic valve mean gradient measures 12.5 mmHg. Aortic valve peak gradient measures 23.5 mmHg. Aortic valve area, by VTI measures 1.35 cm. There is a 23 mm Edwards bovine valve present in the aortic position. Procedure Date: 2009. Pulmonic Valve: The pulmonic valve was grossly normal. Pulmonic valve regurgitation is mild. Aorta: The aortic root and ascending aorta are structurally normal, with no evidence of dilitation. Venous: The inferior vena cava is normal in size with greater than 50% respiratory variability, suggesting right atrial pressure of 3 mmHg. IAS/Shunts: No atrial level shunt detected by color flow Doppler.  LEFT VENTRICLE PLAX 2D LVIDd:         5.00 cm     Diastology LVIDs:         3.90 cm     LV e' medial:    3.44 cm/s LV PW:         1.40 cm     LV E/e' medial:  41.6 LV IVS:        1.20 cm     LV e' lateral:   7.75 cm/s LVOT diam:     2.00 cm     LV E/e' lateral: 18.5 LV SV:         72 LV SV Index:   37 LVOT Area:     3.14 cm  LV Volumes (MOD) LV vol d, MOD A2C: 82.9 ml LV vol d, MOD A4C: 74.0 ml LV vol s, MOD A2C: 38.6 ml LV vol s, MOD A4C: 35.6 ml LV SV MOD A2C:     44.3 ml LV SV MOD A4C:     74.0 ml LV SV MOD BP:      41.2 ml RIGHT VENTRICLE RV S prime:     5.06 cm/s TAPSE (M-mode): 1.1 cm LEFT ATRIUM             Index        RIGHT ATRIUM           Index LA diam:        4.60 cm 2.38 cm/m   RA Area:     21.10 cm LA Vol (A2C):   77.3 ml  40.07 ml/m  RA Volume:   60.60 ml  31.42 ml/m LA Vol (A4C):   85.1 ml 44.12 ml/m LA Biplane Vol: 82.6 ml 42.82 ml/m  AORTIC VALVE AV Area (Vmax):    1.30 cm AV Area (Vmean):   1.31 cm AV Area (VTI):     1.35 cm AV Vmax:           242.50 cm/s AV Vmean:          163.000 cm/s AV VTI:  0.535 m AV Peak Grad:      23.5 mmHg AV Mean Grad:      12.5 mmHg LVOT Vmax:         100.00 cm/s LVOT Vmean:        67.800 cm/s LVOT VTI:          0.229 m LVOT/AV VTI ratio: 0.43  AORTA Ao Root diam: 3.10 cm Ao Asc diam:  3.60 cm MITRAL VALVE                TRICUSPID VALVE MV Area (PHT): 3.42 cm     TR Peak grad:   6.8 mmHg MV Decel Time: 222 msec     TR Vmax:        130.00 cm/s MV E velocity: 143.00 cm/s MV A velocity: 133.00 cm/s  SHUNTS MV E/A ratio:  1.08         Systemic VTI:  0.23 m                             Systemic Diam: 2.00 cm Phineas Inches Electronically signed by Phineas Inches Signature Date/Time: 04/09/2021/3:16:51 PM    Final     Cardiac Studies     Patient Profile     [name] is a 87 y.o. male with a hx of CAD status post redo CABG, AS status post AVR in 2008, mild bilateral ICA stenosis, hypertension, hyperlipidemia, PAF and PVD who was seen 04/09/2021 for the evaluation of hypertensive urgency at the request of Dr. Bonner Puna.  Assessment & Plan    #HTN urgency Resolved. BP have been much better controlled Continue amlodipine 2.5mg  PO dialy Continue lasix Continue hydralazine 100mg  PO TID Continue coreg 6.25mg  PO BID  Cardiology to sign off. Please call with questions/concerns.   For questions or updates, please contact [place] Please consult [email or web address] for contact info under        Signed, Vickie Epley, MD  04/11/2021, 10:13 AM

## 2021-04-12 DIAGNOSIS — I779 Disorder of arteries and arterioles, unspecified: Secondary | ICD-10-CM | POA: Diagnosis not present

## 2021-04-12 DIAGNOSIS — I480 Paroxysmal atrial fibrillation: Secondary | ICD-10-CM | POA: Diagnosis not present

## 2021-04-12 DIAGNOSIS — N1831 Chronic kidney disease, stage 3a: Secondary | ICD-10-CM | POA: Diagnosis not present

## 2021-04-12 DIAGNOSIS — I160 Hypertensive urgency: Secondary | ICD-10-CM | POA: Diagnosis not present

## 2021-04-12 LAB — BASIC METABOLIC PANEL
Anion gap: 9 (ref 5–15)
BUN: 24 mg/dL — ABNORMAL HIGH (ref 8–23)
CO2: 25 mmol/L (ref 22–32)
Calcium: 8.8 mg/dL — ABNORMAL LOW (ref 8.9–10.3)
Chloride: 104 mmol/L (ref 98–111)
Creatinine, Ser: 1.54 mg/dL — ABNORMAL HIGH (ref 0.61–1.24)
GFR, Estimated: 42 mL/min — ABNORMAL LOW (ref >60)
Glucose, Bld: 96 mg/dL (ref 70–99)
Potassium: 4.2 mmol/L (ref 3.5–5.1)
Sodium: 138 mmol/L (ref 135–145)

## 2021-04-12 MED ORDER — AMLODIPINE BESYLATE 2.5 MG PO TABS: 2.5000 mg | ORAL_TABLET | Freq: Every day | ORAL | 0 refills | Status: DC

## 2021-04-12 MED ORDER — CARVEDILOL 6.25 MG PO TABS: 6.2500 mg | ORAL_TABLET | Freq: Two times a day (BID) | ORAL | 0 refills | Status: DC

## 2021-04-12 NOTE — Progress Notes (Signed)
Occupational Therapy Treatment Patient Details Name: [name] MRN: [id number] DOB: 1924/07/19 Today's Date: 04/12/2021   History of present illness This 87 y.o. admitted with Rt sided HA and confusion.  CT and MRI negative for acute abnormality. Dx: Hypertensive urgency vs TIA.  PMH includes: CAD, A-Fib, CTS, h/o meningitis, h/o CABG   OT comments  Patient continues to make steady progress towards goals in skilled OT session. Patient's session encompassed assessment of blood pressure to ensure safe discharge home with home health services. Patient noted to have a bigger drop in systolic pressure in standing in comparison to PT session, however continues to endorse no lightheadedness or dizziness. (See numbers below). Therapist remains in support of HHOT for safe discharge home as well as an aide to ensure safety in the home. Therapy will continue to follow while in house.   Orthostatic BPs   Supine 114/51  Sitting 91/51  Sitting after 3 min 120/55  Standing 90/43  Standing after 3 min 89/53  Sitting after 3 min 135/70     Recommendations for follow up therapy are one component of a multi-disciplinary discharge planning process, led by the attending physician.  Recommendations may be updated based on patient status, additional functional criteria and insurance authorization.    Follow Up Recommendations  Home health OT    Assistance Recommended at Discharge Intermittent Supervision/Assistance  Equipment Recommendations  None recommended by OT    Recommendations for Other Services      Precautions / Restrictions Precautions Precautions: Fall Precaution Comments: Pt endorses h/o "a lot of falls" Restrictions Weight Bearing Restrictions: No       Mobility Bed Mobility Overal bed mobility: Modified Independent                  Transfers Overall transfer level: Needs assistance Equipment used: Rolling walker (2 wheels) Transfers: Sit to/from Stand Sit to  Stand: Min guard           General transfer comment: Min guard for safety.     Balance Overall balance assessment: Mild deficits observed, not formally tested                                         ADL either performed or assessed with clinical judgement   ADL Overall ADL's : Needs assistance/impaired                                       General ADL Comments: session focus on orthostatic BP measurements for safe discharge home      Cognition Arousal/Alertness: Awake/alert Behavior During Therapy: WFL for tasks assessed/performed Overall Cognitive Status: Within Functional Limits for tasks assessed                                 General Comments: grossly WFL for tasks assessed                General Comments HR 73bpm at rest, 90bpm with ambulation.    Pertinent Vitals/ Pain       Pain Assessment: No/denies pain   Frequency  Min 2X/week        Progress Toward Goals  OT Goals(current goals can now be found in the care plan section)  Progress towards  OT goals: Progressing toward goals  Acute Rehab OT Goals Patient Stated Goal: to go home later today OT Goal Formulation: With patient Time For Goal Achievement: 04/24/21 Potential to Achieve Goals: Good  Plan Discharge plan remains appropriate    Co-evaluation                 AM-PAC OT "6 Clicks" Daily Activity     Outcome Measure   Help from another person eating meals?: None Help from another person taking care of personal grooming?: A Little Help from another person toileting, which includes using toliet, bedpan, or urinal?: A Little Help from another person bathing (including washing, rinsing, drying)?: A Little Help from another person to put on and taking off regular upper body clothing?: A Little Help from another person to put on and taking off regular lower body clothing?: A Little 6 Click Score: 19    End of Session Equipment  Utilized During Treatment: Rolling walker (2 wheels)  OT Visit Diagnosis: Unsteadiness on feet (R26.81);Repeated falls (R29.6)   Activity Tolerance Patient tolerated treatment well   Patient Left in bed;with call bell/phone within reach;with bed alarm set   Nurse Communication Mobility status        Time: [id number] OT Time Calculation (min): 28 min  Charges: OT General Charges $OT Visit: 1 Visit OT Treatments $Self Care/Home Management : 23-37 mins  Sherman. Delsin Copen, COTA/L Acute Rehabilitation Services [place] 04/12/2021, 12:29 PM

## 2021-04-12 NOTE — Plan of Care (Signed)

## 2021-04-12 NOTE — TOC Initial Note (Signed)
Transition of Care [facility]) - Initial/Assessment Note    Patient Details  Name: [name] MRN: [id number] Date of Birth: Jun 26, 1924  Transition of [facility]) CM/SW Contact:    Gala Lewandowsky, RN Phone Number: 04/12/2021, 12:48 PM  Clinical Narrative:  Case Manager spoke with the patients son regarding home health needs. Patient and son both agreeable to home health services. Son did not have a preference  for an agency. Case Manager called [facility] and the office cannot service the patient. Case Manager then called [facility] and the agency will be able to service the patient within 24-48 hours post transition home. Patient in need for a rolling walker for home-ordered via [facility] and the equipment will be delivered to the room prior to transition home. Son will transport home via private vehicle. No further needs from Case Manager at this time.                  Expected Discharge Plan: Home w Home Health Services Barriers to Discharge: No Barriers Identified   Patient Goals and CMS Choice Patient states their goals for this hospitalization and ongoing recovery are:: to return home with home health   Choice offered to / list presented to : Adult Children (son did not have a preference.)  Expected Discharge Plan and Services Expected Discharge Plan: Home w Home Health Services   Discharge Planning Services: CM Consult   Living arrangements for the past 2 months: Single Family Home Expected Discharge Date: 04/12/21               DME Arranged: Dan Humphreys rolling DME Agency: [facility] Date DME Agency Contacted: 04/12/21 Time DME Agency Contacted: 1240 Representative spoke with at DME Agency: Velna Hatchet HH Arranged: PT HH Agency: [facility] Date [facility] Agency Contacted: 04/12/21 Time HH Agency Contacted: 1247 Representative spoke with at [facility] Agency: Amy  Prior Living Arrangements/Services Living arrangements for the past 2 months: Single Family Home Lives with::  Self Patient language and need for interpreter reviewed:: Yes Do you feel safe going back to the place where you live?: Yes      Need for Family Participation in Patient Care: No (Comment) Care giver support system in place?: No (comment) Current home services: DME (Patient has a shower chair.) Criminal Activity/Legal Involvement Pertinent to Current Situation/Hospitalization: No - Comment as needed  Activities of Daily Living Home Assistive Devices/Equipment: Cane (specify quad or straight) ADL Screening (condition at time of admission) Patient's cognitive ability adequate to safely complete daily activities?: No Is the patient deaf or have difficulty hearing?: Yes Does the patient have difficulty seeing, even when wearing glasses/contacts?: No Does the patient have difficulty concentrating, remembering, or making decisions?: No Patient able to express need for assistance with ADLs?: Yes Does the patient have difficulty dressing or bathing?: Yes Independently performs ADLs?: No Communication: Independent Dressing (OT): Needs assistance Is this a change from baseline?: Change from baseline, expected to last <3days Grooming: Independent Feeding: Independent Bathing: Needs assistance Is this a change from baseline?: Change from baseline, expected to last <3 days Toileting: Needs assistance Is this a change from baseline?: Change from baseline, expected to last <3 days In/Out Bed: Needs assistance Is this a change from baseline?: Change from baseline, expected to last <3 days Walks in Home: Needs assistance Is this a change from baseline?: Change from baseline, expected to last <3 days Does the patient have difficulty walking or climbing stairs?: Yes Weakness of Legs: Both Weakness of Arms/Hands: None  Permission Sought/Granted Permission sought to share information with : Facility Sport and exercise psychologist, Family Supports, Case Manager Permission granted to share information with :  Yes, Verbal Permission Granted     Permission granted to share info w AGENCY: [facility], [facility].        Emotional Assessment Appearance:: Appears stated age Attitude/Demeanor/Rapport: Engaged Affect (typically observed): Appropriate Orientation: : Oriented to  Time, Oriented to Place, Oriented to Self, Oriented to Situation Alcohol / Substance Use: Not Applicable Psych Involvement: No (comment)  Admission diagnosis:  TIA (transient ischemic attack) [G45.9] Discoordination [R27.9] Hypertensive urgency [I16.0] Patient Active Problem List   Diagnosis Date Noted   CKD (chronic kidney disease), stage III ([place]) 04/09/2021   Hypertensive urgency 04/08/2021   Glaucoma 11/30/2020   Atrial fibrillation ([place]) 11/12/2013   Carotid artery disease ([place]) 11/12/2013   Claudication ([place]) 12/12/2012   Peripheral vascular disease ([place]) 12/12/2012   Carotid bruit 08/23/2011   Atrial fibrillation with RVR ([place]) 01/12/2011   Essential hypertension    Hyperlipidemia    Coronary artery disease    Status post aortic valve replacement with tissue    PCP:  Haydee Salter, MD Pharmacy:   [facility] [place], [place] [address] Phone: [phone] Fax: [phone]  [id number] - [place], [address] [place] [address] Phone: [phone] [phone]   Readmission Risk Interventions No flowsheet data found.

## 2021-04-12 NOTE — Progress Notes (Signed)
Physical Therapy Treatment Patient Details Name: [name] MRN: [id number] DOB: Jun 30, 1924 Today's Date: 04/12/2021   History of Present Illness This 87 y.o. admitted with Rt sided HA and confusion.  CT and MRI negative for acute abnormality. Dx: Hypertensive urgency vs TIA.  PMH includes: CAD, A-Fib, CTS, h/o meningitis, h/o CABG    PT Comments    Pt supine in bed agreeable to perform orthostatic hypotension measurement prior to walking in hallway. BP levels are improved in terms of value, however pt continues to have a significant drop in systolic BP with positional change. Pt does not endorse any dizziness or lightheadedness and value rebound with time. However, pt self reports he probably will not wait to move after positional change at home. Educated pt and son that pt has appropriate strength, balance and endurance to discharge home with HHPT rather than SNF, but will need someone to monitor his BP levels for movement. Son verbalizes understanding, pt with less agreement. Ideally, pt would have 24 hour care to be able to stay in his home.   Orthostatic BPs  Supine 162/59  Sitting 113/69  Sitting after 3 min 159/62  Standing 112/55  Standing after 3 min 126/55  After ambulation  135/49       Recommendations for follow up therapy are one component of a multi-disciplinary discharge planning process, led by the attending physician.  Recommendations may be updated based on patient status, additional functional criteria and insurance authorization.  Follow Up Recommendations  Home health PT (maximal HHAide service available)     Assistance Recommended at Discharge Intermittent Supervision/Assistance  Equipment Recommendations  Rolling walker (2 wheels)       Precautions / Restrictions Precautions Precautions: Fall Precaution Comments: Pt endorses h/o "a lot of falls" Restrictions Weight Bearing Restrictions: No     Mobility  Bed Mobility Overal bed mobility: Modified  Independent                  Transfers Overall transfer level: Needs assistance Equipment used: Rolling walker (2 wheels) Transfers: Sit to/from Stand Sit to Stand: Min guard           General transfer comment: Min guard for safety.    Ambulation/Gait Ambulation/Gait assistance: Min guard Gait Distance (Feet): 125 Feet Assistive device: Rolling walker (2 wheels) Gait Pattern/deviations: Step-through pattern;Decreased stride length Gait velocity: Decreased     General Gait Details: Min guard for safety. cues for walker safety.  Pt requires increased cuing for proximity to RW and navigation around obstacles, as he is unfamiliar with RW use. Prefers to use cane at home. Educated that given his falls he needs to use a RW at all times          Balance Overall balance assessment: Mild deficits observed, not formally tested                                          Cognition Arousal/Alertness: Awake/alert Behavior During Therapy: WFL for tasks assessed/performed Overall Cognitive Status: Within Functional Limits for tasks assessed                                 General Comments: grossly WFL for tasks assessed           General Comments General comments (skin integrity, edema, etc.): HR 73bpm at rest,  90bpm with ambulation.      Pertinent Vitals/Pain Pain Assessment: No/denies pain     PT Goals (current goals can now be found in the care plan section) Acute Rehab PT Goals Patient Stated Goal: to go home PT Goal Formulation: With patient Time For Goal Achievement: 04/23/21 Potential to Achieve Goals: Good Progress towards PT goals: Progressing toward goals    Frequency    Min 3X/week      PT Plan Current plan remains appropriate       AM-PAC PT "6 Clicks" Mobility   Outcome Measure  Help needed turning from your back to your side while in a flat bed without using bedrails?: A Little Help needed moving from  lying on your back to sitting on the side of a flat bed without using bedrails?: A Little Help needed moving to and from a bed to a chair (including a wheelchair)?: A Little Help needed standing up from a chair using your arms (e.g., wheelchair or bedside chair)?: A Little Help needed to walk in hospital room?: A Little Help needed climbing 3-5 steps with a railing? : A Little 6 Click Score: 18    End of Session Equipment Utilized During Treatment: Gait belt Activity Tolerance: Patient tolerated treatment well Patient left: with call bell/phone within reach;in chair;with family/visitor present Nurse Communication: Mobility status PT Visit Diagnosis: Unsteadiness on feet (R26.81);Muscle weakness (generalized) (M62.81)     Time: 0822-0905 PT Time Calculation (min) (ACUTE ONLY): 43 min  Charges:  $Gait Training: 8-22 mins $Therapeutic Activity: 8-22 mins $Self Care/Home Management: 8-22                     Shila Kruczek B. Migdalia Dk PT, DPT Acute Rehabilitation Services Pager [phone] Office [phone]    [place] 04/12/2021, 9:31 AM

## 2021-04-12 NOTE — Discharge Summary (Signed)
Physician Discharge Summary  RYLIN EAGAN [id number] DOB: 1924-06-22 DOA: 04/08/2021  PCP: Haydee Salter, MD  Admit date: 04/08/2021 Discharge date: 04/12/2021  Admitted From: Home Disposition: Home   Recommendations for Outpatient Follow-up:  Follow up with PCP and/or cardiology in the next 1-2 weeks for continued management of severe HTN complicated by orthostatic hypotension. See medication changes below:  Continue lasix and hydralazine Start norvasc 2.5mg  Change metoprolol to coreg Consider neurology evaluation of headache if persistent after improvement in blood pressure. Please obtain BMP/CBC at follow up  Home Health: PT, OT, aide Equipment/Devices: None new Discharge Condition: Stable CODE STATUS: DNR Diet recommendation: Heart healthy  Brief/Interim Summary: [name] is a 87 y.o. male with a history of CAD s/p CABG, s/p bioprosthetic AVR, HTN, HLD, paroxysmal atrial fibrillation, who presented to the ED 12/8 with headache and high blood pressure (for which he was seen 12/1) now with confusion, concern for stroke. Neuroimaging was negative though he continued to have severe hypertension and headache, so was admitted. Cardiology consulted. Blood pressure has come under better control though then suffered severe symptomatic orthostatic hypotension for which antihypertensive regimen was deescalated with improvement. He has been reevaluated by therapy who continue to feel his functional status is adequate to return home alone.   Discharge Diagnoses:  Principal Problem:   Hypertensive urgency Active Problems:   Essential hypertension   Hyperlipidemia   Coronary artery disease   Atrial fibrillation (HCC)   Carotid artery disease (HCC)   CKD (chronic kidney disease), stage III (HCC)  HTN urgency: Resolved. Echocardiogram shows preserved LVEF, grade 1 diastolic dysfunction, severe LAE, mod RAE, well-seated AVR without leak. - Increased hydralazine 50mg  > 100mg  TID  beginning 12/9. With addition of other medications, became orthostatic, so this was returned to PTA dosing with improvement.  - Changed metoprolol to coreg 12/10 - Added norvasc 2.5mg  (hx LE swelling due to this at higher dose) 12/9 - His cardiologist has been active in titrating his medications as an outpatient, appreciate their assistance as inpatient and follow up after discharge.    NSVT: Asymptomatic, not persistent at this time.   Headache: Unclear etiology, though no acute findings on CT or MRI. No findings suggestive of PRES at this time. This has been persistent, though not severe enough for the patient to request prn's at this time. No significant sinus disease on neuroimaging. - Continue prn's and BP management as above. Improved overall. - Tylenol prn. NSAID likely to exacerbate swelling and renal dysfunction   CAD s/p CABG remotely, HLD: No WMA on echo. - Continue BB, ASA, statin - Troponin checked despite no report of chest pain and slightly elevated as anticipated with HTN urgency. No further intervention currently planned.   Stage IIIb CKD: SCr roughly stable.  - Avoid nephrotoxins and relative hypotension   Paroxysmal atrial fibrillation: NSR currently. - Continue coreg in place of metop. - Not on anticoagulation due to age/fall risk.    Acute metabolic encephalopathy: Appears to have resolved with Tx severe HTN, and was mild. Negative neuroimaging. - Delirium precautions. Has not suffered severe hospital delirium thus far.   Glaucoma: No vision changes.  - Continue gtt's   Obesity: Estimated body mass index is 30.62 kg/m as calculated from the following:   Height as of this encounter: 5\' 5"  (1.651 m).   Weight as of this encounter: 83.5 kg.   Advanced age: Lives alone, does have a component of gait instability for which PT and OT recommend home  health.    Constipation: Chronic, controlled on his home miralax.  Discharge Instructions Discharge Instructions      Diet - low sodium heart healthy   Complete by: As directed    Discharge instructions   Complete by: As directed    You were evaluated for headache and high blood pressure. You have improved and are stable for discharge with the following recommendations:  - Continue taking lasix daily - Continue taking hydralazine 50mg  three times per day - STOP metoprolol. START coreg which replaces metoprolol. - START taking norvasc 2.5mg  daily - Avoid standing up quickly and sit down if you feel lightheaded.  - Follow up with Dr. Martinique and/or Dr. Gena Fray in the next 1-2 weeks or seek medical attention right away if your symptoms return/worsen.   Increase activity slowly   Complete by: As directed       Allergies as of 04/12/2021       Reactions   Codeine    Hallucinations   Penicillins Hives        Medication List     STOP taking these medications    metoprolol succinate 25 MG 24 hr tablet Commonly known as: Toprol XL       TAKE these medications    amLODipine 2.5 MG tablet Commonly known as: NORVASC Take 1 tablet (2.5 mg total) by mouth daily. Start taking on: April 13, 2021   aspirin 81 MG chewable tablet Commonly known as: Aspirin Childrens Chew 1 tablet (81 mg total) by mouth daily.   atorvastatin 40 MG tablet Commonly known as: LIPITOR Take 1 tablet (40 mg total) by mouth daily.   carvedilol 6.25 MG tablet Commonly known as: COREG Take 1 tablet (6.25 mg total) by mouth 2 (two) times daily with a meal.   Combigan 0.2-0.5 % ophthalmic solution Generic drug: brimonidine-timolol Place 1 drop into the left eye every 12 (twelve) hours.   furosemide 20 MG tablet Commonly known as: LASIX Take 1 tablet (20 mg total) by mouth daily.   hydrALAZINE 50 MG tablet Commonly known as: APRESOLINE Take 1 tablet (50 mg total) by mouth 3 (three) times daily.   nitroGLYCERIN 0.4 MG SL tablet Commonly known as: NITROSTAT Place 1 tablet (0.4 mg total) under the tongue every 5  (five) minutes as needed. What changed: reasons to take this   Travatan Z 0.004 % Soln ophthalmic solution Generic drug: Travoprost (BAK Free) Place 1 drop into the left eye at bedtime.        Follow-up Information     Rudd, Lillette Boxer, MD Follow up.   Specialty: Family Medicine Contact information: [place] [address] [phone]         Martinique, Peter M, MD .   Specialty: Cardiology Contact information: [address] [place] [address] [place] [address] [phone]                Allergies  Allergen Reactions   Codeine     Hallucinations   Penicillins Hives    Consultations: Cardiology  Procedures/Studies: DG Ankle Complete Left  Result Date: 03/19/2021 CLINICAL DATA:  Trauma, pain EXAM: LEFT ANKLE COMPLETE - 3+ VIEW COMPARISON:  None. FINDINGS: No displaced fracture or dislocation is seen. Ankle mortise is well-maintained. There is soft tissue swelling around the ankle. There are scattered arterial calcifications. IMPRESSION: No recent fracture or dislocation is seen in the left ankle. Electronically Signed   By: Elmer Picker M.D.   On: 03/19/2021 14:49   CT  HEAD WO CONTRAST  Result Date: 04/08/2021 CLINICAL DATA:  Acute neuro deficit.  Right-sided headache 2 weeks EXAM: CT HEAD WITHOUT CONTRAST TECHNIQUE: Contiguous axial images were obtained from the base of the skull through the vertex without intravenous contrast. COMPARISON:  CT head 04/02/2021 FINDINGS: Brain: Generalized atrophy. Patchy white matter hypodensity bilaterally unchanged. Negative for acute infarct, hemorrhage, mass Vascular: Negative for hyperdense vessel Skull: Negative Sinuses/Orbits: Negative Other: None IMPRESSION: Atrophy and mild chronic microvascular ischemic change in the white matter. No acute abnormality Electronically Signed   By: Franchot Gallo M.D.   On: 04/08/2021 15:01   CT Head Wo Contrast  Result Date: 04/02/2021 CLINICAL DATA:   Headache EXAM: CT HEAD WITHOUT CONTRAST TECHNIQUE: Contiguous axial images were obtained from the base of the skull through the vertex without intravenous contrast. COMPARISON:  None. FINDINGS: Brain: No acute intracranial hemorrhage, mass effect, or herniation. No extra-axial fluid collections. No evidence of acute territorial infarct. No hydrocephalus. Mild cortical volume loss. Patchy hypodensities throughout the periventricular and subcortical white matter, likely secondary to chronic microvascular ischemic changes. Vascular: Calcified plaques in the carotid siphons. Skull: Normal. Negative for fracture or focal lesion. Sinuses/Orbits: No acute finding. Other: None. IMPRESSION: Chronic changes with no acute intracranial process identified. Electronically Signed   By: Ofilia Neas M.D.   On: 04/02/2021 14:48   MR Brain W and Wo Contrast  Result Date: 04/08/2021 CLINICAL DATA:  Initial evaluation for acute headache. EXAM: MRI HEAD WITHOUT AND WITH CONTRAST TECHNIQUE: Multiplanar, multiecho pulse sequences of the brain and surrounding structures were obtained without and with intravenous contrast. CONTRAST:  8.28mL GADAVIST GADOBUTROL 1 MMOL/ML IV SOLN COMPARISON:  CT from earlier the same day. FINDINGS: Brain: Age-related cerebral atrophy. Patchy T2/FLAIR hyperintensity involving the periventricular deep white matter both cerebral hemispheres most consistent with chronic small vessel ischemic disease, mild to moderate in nature. Small remote left cerebellar infarct noted. No abnormal foci of restricted diffusion to suggest acute or subacute ischemia. Gray-white matter differentiation otherwise maintained. No encephalomalacia to suggest chronic cortical infarction. No acute or chronic intracranial hemorrhage. No mass lesion, midline shift or mass effect. No hydrocephalus or extra-axial fluid collection. Pituitary gland suprasellar region within normal limits. Midline structures intact and normal. No  abnormal enhancement. Vascular: Major intracranial vascular flow voids are maintained. Skull and upper cervical spine: Craniocervical junction within normal limits. Bone marrow signal intensity normal. No scalp soft tissue abnormality. Sinuses/Orbits: Patient status post bilateral ocular lens replacement. Globes orbital soft tissues demonstrate no acute finding. Mild scattered mucosal thickening noted within the sphenoid ethmoidal sinuses. Paranasal sinuses are otherwise clear. No mastoid effusion. Inner ear structures grossly normal. Other: None. IMPRESSION: 1. No acute intracranial abnormality. 2. Age-related cerebral atrophy with mild to moderate chronic small vessel ischemic disease. Electronically Signed   By: Jeannine Boga M.D.   On: 04/08/2021 21:10   DG CHEST PORT 1 VIEW  Result Date: 04/09/2021 CLINICAL DATA:  Headache. EXAM: PORTABLE CHEST 1 VIEW COMPARISON:  06/28/2007. FINDINGS: The heart is enlarged and there is evidence of prior cardiothoracic surgery. A prosthetic cardiac valve is noted. There is atherosclerotic calcification of the aorta. Calcified pleural plaques are noted in the mid to lower lung fields bilaterally. There is mild airspace disease at the lung bases bilaterally. No effusion or pneumothorax. No acute osseous abnormality. IMPRESSION: 1. Mild atelectasis or infiltrate at the lung bases. 2. Cardiomegaly. Electronically Signed   By: Brett Fairy M.D.   On: 04/09/2021 01:40   DG Foot Complete  Left  Result Date: 03/19/2021 CLINICAL DATA:  Recent fall, pain EXAM: LEFT FOOT - COMPLETE 3+ VIEW COMPARISON:  None. FINDINGS: No displaced fracture or dislocation is seen. Minimal bony spurs seen in first metatarsophalangeal joint. There is soft tissue swelling over the dorsum. Scattered arterial calcifications are seen. IMPRESSION: No recent fracture or dislocation is seen in the left foot. Electronically Signed   By: Elmer Picker M.D.   On: 03/19/2021 14:48    ECHOCARDIOGRAM COMPLETE  Result Date: 04/09/2021    ECHOCARDIOGRAM REPORT   Patient Name:   MATILDE MONOPOLI Date of Exam: 04/09/2021 Medical Rec #:  [id number]       Height:       65.0 in Accession #:    [id number]      Weight:       188.5 lb Date of Birth:  [birth year]       BSA:          1.929 m Patient Age:    87 years        BP:           177/67 mmHg Patient Gender: M               HR:           69 bpm. Exam Location:  Inpatient Procedure: 2D Echo, Cardiac Doppler and Color Doppler Indications:    TIA  History:        Patient has prior history of Echocardiogram examinations, most                 recent 01/13/2011. CAD, Arrythmias:Atrial Fibrillation; Risk                 Factors:Hypertension and Dyslipidemia.                 Aortic Valve: 23 mm Edwards bovine valve is present in the                 aortic position. Procedure Date: 2009.  Sonographer:    Bernadene Person RDCS Referring Phys: [place]  1. Left ventricular ejection fraction, by estimation, is 50 to 55%. The left ventricle has low normal function. The left ventricle has no regional wall motion abnormalities. There is mild left ventricular hypertrophy. Left ventricular diastolic parameters are consistent with Grade I diastolic dysfunction (impaired relaxation).  2. Right ventricular systolic function is mildly reduced. The right ventricular size is normal. There is normal pulmonary artery systolic pressure.  3. Left atrial size was severely dilated.  4. Right atrial size was moderately dilated.  5. The mitral valve is grossly normal. Mild mitral valve regurgitation.  6. Prosthetic valve is well seated, no paravalvular leak. AV max 2.4 m/s. The aortic valve is grossly normal. Aortic valve regurgitation is not visualized. No aortic stenosis is present. There is a 23 mm Edwards bovine valve present in the aortic position. Procedure Date: 2009.  7. The inferior vena cava is normal in size with greater than 50% respiratory  variability, suggesting right atrial pressure of 3 mmHg. Comparison(s): No prior Echocardiogram. Conclusion(s)/Recommendation(s): Otherwise normal echocardiogram, with minor abnormalities described in the report. FINDINGS  Left Ventricle: Left ventricular ejection fraction, by estimation, is 50 to 55%. The left ventricle has low normal function. The left ventricle has no regional wall motion abnormalities. The left ventricular internal cavity size was normal in size. There is mild left ventricular hypertrophy. Left ventricular diastolic parameters are consistent with Grade I diastolic dysfunction (  impaired relaxation). Right Ventricle: The right ventricular size is normal. No increase in right ventricular wall thickness. Right ventricular systolic function is mildly reduced. There is normal pulmonary artery systolic pressure. The tricuspid regurgitant velocity is 1.30 m/s, and with an assumed right atrial pressure of 3 mmHg, the estimated right ventricular systolic pressure is 9.8 mmHg. Left Atrium: Left atrial size was severely dilated. Right Atrium: Right atrial size was moderately dilated. Pericardium: There is no evidence of pericardial effusion. Mitral Valve: The mitral valve is grossly normal. Mild mitral valve regurgitation. Tricuspid Valve: The tricuspid valve is grossly normal. Tricuspid valve regurgitation is trivial. Aortic Valve: Prosthetic valve is well seated, no paravalvular leak. AV max 2.4 m/s. The aortic valve is grossly normal. Aortic valve regurgitation is not visualized. No aortic stenosis is present. Aortic valve mean gradient measures 12.5 mmHg. Aortic valve peak gradient measures 23.5 mmHg. Aortic valve area, by VTI measures 1.35 cm. There is a 23 mm Edwards bovine valve present in the aortic position. Procedure Date: 2009. Pulmonic Valve: The pulmonic valve was grossly normal. Pulmonic valve regurgitation is mild. Aorta: The aortic root and ascending aorta are structurally normal, with no  evidence of dilitation. Venous: The inferior vena cava is normal in size with greater than 50% respiratory variability, suggesting right atrial pressure of 3 mmHg. IAS/Shunts: No atrial level shunt detected by color flow Doppler.  LEFT VENTRICLE PLAX 2D LVIDd:         5.00 cm     Diastology LVIDs:         3.90 cm     LV e' medial:    3.44 cm/s LV PW:         1.40 cm     LV E/e' medial:  41.6 LV IVS:        1.20 cm     LV e' lateral:   7.75 cm/s LVOT diam:     2.00 cm     LV E/e' lateral: 18.5 LV SV:         72 LV SV Index:   37 LVOT Area:     3.14 cm  LV Volumes (MOD) LV vol d, MOD A2C: 82.9 ml LV vol d, MOD A4C: 74.0 ml LV vol s, MOD A2C: 38.6 ml LV vol s, MOD A4C: 35.6 ml LV SV MOD A2C:     44.3 ml LV SV MOD A4C:     74.0 ml LV SV MOD BP:      41.2 ml RIGHT VENTRICLE RV S prime:     5.06 cm/s TAPSE (M-mode): 1.1 cm LEFT ATRIUM             Index        RIGHT ATRIUM           Index LA diam:        4.60 cm 2.38 cm/m   RA Area:     21.10 cm LA Vol (A2C):   77.3 ml 40.07 ml/m  RA Volume:   60.60 ml  31.42 ml/m LA Vol (A4C):   85.1 ml 44.12 ml/m LA Biplane Vol: 82.6 ml 42.82 ml/m  AORTIC VALVE AV Area (Vmax):    1.30 cm AV Area (Vmean):   1.31 cm AV Area (VTI):     1.35 cm AV Vmax:           242.50 cm/s AV Vmean:          163.000 cm/s AV VTI:  0.535 m AV Peak Grad:      23.5 mmHg AV Mean Grad:      12.5 mmHg LVOT Vmax:         100.00 cm/s LVOT Vmean:        67.800 cm/s LVOT VTI:          0.229 m LVOT/AV VTI ratio: 0.43  AORTA Ao Root diam: 3.10 cm Ao Asc diam:  3.60 cm MITRAL VALVE                TRICUSPID VALVE MV Area (PHT): 3.42 cm     TR Peak grad:   6.8 mmHg MV Decel Time: 222 msec     TR Vmax:        130.00 cm/s MV E velocity: 143.00 cm/s MV A velocity: 133.00 cm/s  SHUNTS MV E/A ratio:  1.08         Systemic VTI:  0.23 m                             Systemic Diam: 2.00 cm Carolan Clines Electronically signed by Carolan Clines Signature Date/Time: 04/09/2021/3:16:51 PM    Final        Subjective: Feels well. Did very well with PT who confirms he does not need and would not benefit from facility rehabilitation. He has no chest pain or dyspnea, reports no headache. Despite drop in blood pressure (which is less than it has been previously) when standing, he has no lightheadedness.   Discharge Exam: Vitals:   04/12/21 0919 04/12/21 0922  BP: (!) 117/50 (!) 117/50  Pulse: 79   Resp:    Temp:    SpO2:     General: Pt is alert, awake, not in acute distress Cardiovascular: RRR, S1/S2 +, no rubs, no gallops Respiratory: CTA bilaterally, no wheezing, no rhonchi Abdominal: Soft, NT, ND, bowel sounds + Extremities: No edema, no cyanosis  Labs: BNP (last 3 results) Recent Labs    04/09/21 0353  BNP 448.2*   Basic Metabolic Panel: Recent Labs  Lab 04/08/21 1432 04/08/21 1438 04/09/21 0353 04/10/21 0641 04/12/21 0135  NA 140 142 140 138 138  K 4.0 4.0 4.0 4.2 4.2  CL 107 107 108 105 104  CO2 26  --  25 26 25   GLUCOSE 84 80 96 115* 96  BUN 21 23 22 20  24*  CREATININE 1.42* 1.40* 1.34* 1.42* 1.54*  CALCIUM 8.9  --  8.5* 8.7* 8.8*  MG  --   --  2.0 2.0  --   PHOS  --   --  3.4  --   --    Liver Function Tests: Recent Labs  Lab 04/08/21 1432 04/09/21 0353  AST 20 18  ALT 14 16  ALKPHOS 64 55  BILITOT 0.5 0.8  PROT 6.0* 5.8*  ALBUMIN 3.3* 3.0*   No results for input(s): LIPASE, AMYLASE in the last 168 hours. No results for input(s): AMMONIA in the last 168 hours. CBC: Recent Labs  Lab 04/08/21 1432 04/08/21 1438 04/09/21 0353  WBC 6.1  --  6.3  NEUTROABS 3.3  --  3.6  HGB 12.1* 11.9* 11.6*  HCT 37.4* 35.0* 35.5*  MCV 90.8  --  89.2  PLT 163  --  162   Cardiac Enzymes: No results for input(s): CKTOTAL, CKMB, CKMBINDEX, TROPONINI in the last 168 hours. BNP: Invalid input(s): POCBNP CBG: No results for input(s): GLUCAP in the last 168 hours. D-Dimer  No results for input(s): DDIMER in the last 72 hours. Hgb A1c No results for  input(s): HGBA1C in the last 72 hours. Lipid Profile No results for input(s): CHOL, HDL, LDLCALC, TRIG, CHOLHDL, LDLDIRECT in the last 72 hours. Thyroid function studies No results for input(s): TSH, T4TOTAL, T3FREE, THYROIDAB in the last 72 hours.  Invalid input(s): FREET3 Anemia work up No results for input(s): VITAMINB12, FOLATE, FERRITIN, TIBC, IRON, RETICCTPCT in the last 72 hours. Urinalysis    Component Value Date/Time   COLORURINE YELLOW 05/31/2007 [place] 05/31/2007 1055   LABSPEC 1.018 05/31/2007 1055   PHURINE 5.5 05/31/2007 1055   GLUCOSEU NEGATIVE 05/31/2007 1055   HGBUR MODERATE (A) 05/31/2007 1055   BILIRUBINUR NEGATIVE 05/31/2007 1055   KETONESUR NEGATIVE 05/31/2007 1055   PROTEINUR NEGATIVE 05/31/2007 1055   UROBILINOGEN 0.2 05/31/2007 1055   NITRITE NEGATIVE 05/31/2007 1055   LEUKOCYTESUR NEGATIVE 05/31/2007 1055    Microbiology Recent Results (from the past 240 hour(s))  Resp Panel by RT-PCR (Flu A&B, Covid) Nasopharyngeal Swab     Status: None   Collection Time: 04/08/21  3:05 PM   Specimen: Nasopharyngeal Swab; Nasopharyngeal(NP) swabs in vial transport medium  Result Value Ref Range Status   SARS Coronavirus 2 by RT PCR NEGATIVE NEGATIVE Final    Comment: (NOTE) SARS-CoV-2 target nucleic acids are NOT DETECTED.  The SARS-CoV-2 RNA is generally detectable in upper respiratory specimens during the acute phase of infection. The lowest concentration of SARS-CoV-2 viral copies this assay can detect is 138 copies/mL. A negative result does not preclude SARS-Cov-2 infection and should not be used as the sole basis for treatment or other patient management decisions. A negative result may occur with  improper specimen collection/handling, submission of specimen other than nasopharyngeal swab, presence of viral mutation(s) within the areas targeted by this assay, and inadequate number of viral copies(<138 copies/mL). A negative result must  be combined with clinical observations, patient history, and epidemiological information. The expected result is Negative.  Fact Sheet for Patients:  EntrepreneurPulse.com.au  Fact Sheet for Healthcare Providers:  IncredibleEmployment.be  This test is no t yet approved or cleared by the [place] FDA and  has been authorized for detection and/or diagnosis of SARS-CoV-2 by FDA under an Emergency Use Authorization (EUA). This EUA will remain  in effect (meaning this test can be used) for the duration of the COVID-19 declaration under Section 564(b)(1) of the Act, 21 U.S.C.section 360bbb-3(b)(1), unless the authorization is terminated  or revoked sooner.       Influenza A by PCR NEGATIVE NEGATIVE Final   Influenza B by PCR NEGATIVE NEGATIVE Final    Comment: (NOTE) The Xpert Xpress SARS-CoV-2/FLU/RSV plus assay is intended as an aid in the diagnosis of influenza from Nasopharyngeal swab specimens and should not be used as a sole basis for treatment. Nasal washings and aspirates are unacceptable for Xpert Xpress SARS-CoV-2/FLU/RSV testing.  Fact Sheet for Patients: EntrepreneurPulse.com.au  Fact Sheet for Healthcare Providers: IncredibleEmployment.be  This test is not yet approved or cleared by the [place] FDA and has been authorized for detection and/or diagnosis of SARS-CoV-2 by FDA under an Emergency Use Authorization (EUA). This EUA will remain in effect (meaning this test can be used) for the duration of the COVID-19 declaration under Section 564(b)(1) of the Act, 21 U.S.C. section 360bbb-3(b)(1), unless the authorization is terminated or revoked.  Performed at [facility], [place] [address], [place], [place] [address]     Time coordinating  discharge: Approximately 40 minutes  Patrecia Pour, MD  [facility] 04/12/2021, 10:02 AM

## 2021-04-14 NOTE — Telephone Encounter (Signed)
PT Almira from [facility] called and would like to start with PT for pt next week and she needs a verbal order and asked for a call back at [phone]

## 2021-04-14 NOTE — Telephone Encounter (Signed)
Noted has appointment on 04/16/21.  Dm/cma

## 2021-04-14 NOTE — Telephone Encounter (Signed)
Spoke to Algood at [facility], [facility], they want PT orders for general stregthing and Gait training.  Advised that we will call them back on Friday 04/16/21 appt.   Dm/cma

## 2021-04-16 ENCOUNTER — Other Ambulatory Visit: Payer: Self-pay

## 2021-04-16 ENCOUNTER — Ambulatory Visit (INDEPENDENT_AMBULATORY_CARE_PROVIDER_SITE_OTHER): Payer: PPO | Admitting: Family Medicine

## 2021-04-16 ENCOUNTER — Encounter: Payer: Self-pay | Admitting: Family Medicine

## 2021-04-16 VITALS — BP 142/64 | HR 75 | Temp 97.1°F | Ht 65.0 in | Wt 183.6 lb

## 2021-04-16 DIAGNOSIS — R600 Localized edema: Secondary | ICD-10-CM | POA: Diagnosis not present

## 2021-04-16 DIAGNOSIS — I10 Essential (primary) hypertension: Secondary | ICD-10-CM

## 2021-04-16 NOTE — Telephone Encounter (Signed)
Called Almira @ [phone] and gave verbal okay per providers instructions, for PT orders.  Dm/cma

## 2021-04-16 NOTE — Progress Notes (Signed)
[facility] [address] [place] [address] Dept: [phone] Dept Fax: [phone]  Office Visit  Subjective:    Patient ID: [name], male    DOB: 11/17/1924, 87 y.o..   MRN: [id number]  Chief Complaint  Patient presents with   Hospitalization [facility] f/u,   needs ears checked.  Also need verbal for PT to start next week.     History of Present Illness:  Patient is in today for follow-up from recent hospitalization. [name] was seen at [facility] on 12/1 with hypertensive urgency. He was treated and released. He returned to the hospital on 12/8, how with severe headache and discoordination. His blood pressure at presentation had been 190/57. During the admission, cardiology was involved in making decisions regarding his BP. His medical situation was difficult due to having high blood pressure, but being sensitive to orthostatic hypotension with treatment. He is now on a regimen of amlodipine 2.5 mg daily, carvedilol 6.25 mg daily, furosemide 20 mg daily, and hydralazine 50 mg TID. [name] notes his headaches have resolved and he is no longer feeling "drunken". He is starting PT soon. He had been advised to use a walker, but presents today using his cane. he notes it is more difficult to fold the walker up to go in the car, so he is only using that at home.   Past Medical History: Patient Active Problem List   Diagnosis Date Noted   Pedal edema 04/16/2021   CKD (chronic kidney disease), stage III ([place]) 04/09/2021   Hypertensive urgency 04/08/2021   Glaucoma 11/30/2020   Atrial fibrillation ([place]) 11/12/2013   Carotid artery disease ([place]) 11/12/2013   Claudication ([place]) 12/12/2012   Peripheral vascular disease ([place]) 12/12/2012   Carotid bruit 08/23/2011   Atrial fibrillation with RVR ([place]) 01/12/2011   Essential hypertension    Hyperlipidemia    Coronary artery disease    Status post aortic valve  replacement with tissue    Past Surgical History:  Procedure Laterality Date   APPENDECTOMY     CARDIAC CATHETERIZATION  04/02/2007   EF 60%/severe two-vessel obstructive coronary artery disease/patent saphenous bein graft to rt coronary/ severe stenosis at the ostium of the lt internal mammary artery graft to the abtuse marginal branch/normal lt ventricular function/ severe aortic stenosis/normal rt heart pressures   CARDIAC CATHETERIZATION  07/25/2002   EF 65%/two-vessel obstructive atherosclerotic coronary artery disease/ patent saphenous vein graft to the distal rt coronary arter//patent lt internal mammary artery graft to the obtuse marginal branch/normal lt ventricular function   CARDIAC CATHETERIZATION  05/17/1991   EF65%/two-vessel ovstructive atherosclerotic coronary artery disease/good lt ventricular performance/compared with previous catheterization there is now total occlusion of the tr coronary artery at the prior angioplasty site/good collateral flow to the distal rt coronary artery is seen/there is also progressive disease in the lt circumflex vessel now to obstructive levels   CARDIAC CATHETERIZATION  07/26/1990   EF60%/borderline obstructive coronary artery disease in the mid lt circumflex & in the rt coronary artery prior angioplasty site/normal lt ventricular function   CARDIAC CATHETERIZATION  05/23/1990   successful percutaneous transluminal coronary angioplasty of the proximal rt coronary artery   CATARACT EXTRACTION W/ INTRAOCULAR LENS IMPLANT Bilateral    CORONARY ANGIOPLASTY  07/12/1990   single vessel obstructive atherosclerotic coronary atrery disease in the rt corornary artery with restenosis of the primary angioplasty site/successful repeat PTCA of the proximal rt coronary arter   CORONARY ARTERY BYPASS  GRAFT     CORONARY ARTERY BYPASS GRAFT     x2 using a reverse saphenous vein graft to posterior descending, reseverse saphenous vein graft to lt internal mammary  artery at the previous bypass to the circumflex with a no vein harvesting   GANGLION CYST EXCISION Right    Wrist   HEMORRHOID SURGERY     at age 41   [name]  05/02/1965   PENILE PROSTHESIS IMPLANT  05/03/1987   STERNOTOMY     redo median with aortic valve replacement with a pericardial tissue valve/[employer] model 3000,96mm,serial numver [id number]   TONSILLECTOMY     at age 51   VASECTOMY     at age 33   Family History  Problem Relation Age of Onset   Cancer Father 41       intestinal   Heart attack Mother 39   Stroke Sister    Outpatient Medications Prior to Visit  Medication Sig Dispense Refill   amLODipine (NORVASC) 2.5 MG tablet Take 1 tablet (2.5 mg total) by mouth daily. 30 tablet 0   aspirin (ASPIRIN CHILDRENS) 81 MG chewable tablet Chew 1 tablet (81 mg total) by mouth daily. 36 tablet 11   atorvastatin (LIPITOR) 40 MG tablet Take 1 tablet (40 mg total) by mouth daily. 90 tablet 3   carvedilol (COREG) 6.25 MG tablet Take 1 tablet (6.25 mg total) by mouth 2 (two) times daily with a meal. 60 tablet 0   COMBIGAN 0.2-0.5 % ophthalmic solution Place 1 drop into the left eye every 12 (twelve) hours.      furosemide (LASIX) 20 MG tablet Take 1 tablet (20 mg total) by mouth daily. 90 tablet 3   hydrALAZINE (APRESOLINE) 50 MG tablet Take 1 tablet (50 mg total) by mouth 3 (three) times daily. 270 tablet 3   nitroGLYCERIN (NITROSTAT) 0.4 MG SL tablet Place 1 tablet (0.4 mg total) under the tongue every 5 (five) minutes as needed. (Patient taking differently: Place 0.4 mg under the tongue every 5 (five) minutes as needed for chest pain.) 25 tablet 1   TRAVATAN Z 0.004 % SOLN ophthalmic solution Place 1 drop into the left eye at bedtime.      No facility-administered medications prior to visit.   Allergies  Allergen Reactions   Codeine     Hallucinations   Penicillins Hives   Objective:   Today's Vitals   04/16/21 1131  BP: (!) 142/64  Pulse: 75   Temp: (!) 97.1 F (36.2 C)  TempSrc: Temporal  SpO2: 96%  Weight: 183 lb 9.6 oz (83.3 kg)  Height: 5\' 5"  (1.651 m)   Body mass index is 30.55 kg/m.   General: Well developed, well nourished. No acute distress. CV: RRR without murmurs or rubs. Pulses 2+ bilaterally. Extremities: 1-2+ edema noted. Psych: Alert and oriented. Normal mood and affect.  Health Maintenance Due  Topic Date Due   TETANUS/TDAP  Never done   Zoster Vaccines- Shingrix (1 of 2) Never done   Pneumonia Vaccine 79+ Years old (2 - PPSV23 if available, else PCV20) 02/13/2019   COVID-19 Vaccine (4 - Booster for Pfizer series) 03/29/2021   Lab Results CBC Latest Ref Rng & Units 04/09/2021 04/08/2021 04/08/2021  WBC 4.0 - 10.5 K/uL 6.3 - 6.1  Hemoglobin 13.0 - 17.0 g/dL 11.6(L) 11.9(L) 12.1(L)  Hematocrit 39.0 - 52.0 % 35.5(L) 35.0(L) 37.4(L)  Platelets 150 - 400 K/uL 162 - 163   CMP Latest Ref Rng & Units 04/12/2021 04/10/2021  04/09/2021  Glucose 70 - 99 mg/dL 96 628(B) 96  BUN 8 - 23 mg/dL 15(V) 20 22  Creatinine 0.61 - 1.24 mg/dL 7.61(Y) 0.73(X) 1.06(Y)  Sodium 135 - 145 mmol/L 138 138 140  Potassium 3.5 - 5.1 mmol/L 4.2 4.2 4.0  Chloride 98 - 111 mmol/L 104 105 108  CO2 22 - 32 mmol/L 25 26 25   Calcium 8.9 - 10.3 mg/dL ) 6.9(S) 8.5(I)  Total Protein 6.5 - 8.1 g/dL - - 5.8(L)  Total Bilirubin 0.3 - 1.2 mg/dL - - 0.8  Alkaline Phos 38 - 126 U/L - - 55  AST 15 - 41 U/L - - 18  ALT 0 - 44 U/L - - 16     Assessment & Plan:   1. Essential hypertension Reviewed discharge summary. Blood pressure is at goal. Mr. Cordae is not having orthostatic symptoms currently. I will plan to see him back in 4 weeks for reassessment. He will be seeing cardiology in Feb.  2. Pedal edema This may be a side effect of amlodipine. It has been improving and Mr. Bi weight is down about 8 lbs, presumably from reduced edema. He is spending more time at home crocheting, so this may be helping the edema.  Newell Coral,  MD

## 2021-04-27 ENCOUNTER — Encounter: Payer: Self-pay | Admitting: Family Medicine

## 2021-05-05 ENCOUNTER — Other Ambulatory Visit: Payer: Self-pay | Admitting: Cardiology

## 2021-05-05 MED ORDER — HYDRALAZINE HCL 50 MG PO TABS: 50.0000 mg | ORAL_TABLET | Freq: Three times a day (TID) | ORAL | 3 refills | Status: DC

## 2021-05-05 MED ORDER — AMLODIPINE BESYLATE 2.5 MG PO TABS: 2.5000 mg | ORAL_TABLET | Freq: Every day | ORAL | 0 refills | Status: DC

## 2021-05-11 ENCOUNTER — Encounter: Payer: Self-pay | Admitting: Family Medicine

## 2021-05-11 MED ORDER — CARVEDILOL 6.25 MG PO TABS: 6.2500 mg | ORAL_TABLET | Freq: Two times a day (BID) | ORAL | 3 refills | Status: DC

## 2021-05-14 ENCOUNTER — Other Ambulatory Visit: Payer: Self-pay

## 2021-05-17 ENCOUNTER — Other Ambulatory Visit: Payer: Self-pay

## 2021-05-17 ENCOUNTER — Ambulatory Visit (INDEPENDENT_AMBULATORY_CARE_PROVIDER_SITE_OTHER): Payer: PPO | Admitting: Family Medicine

## 2021-05-17 ENCOUNTER — Encounter: Payer: Self-pay | Admitting: Family Medicine

## 2021-05-17 VITALS — BP 162/76 | HR 73 | Temp 97.0°F | Ht 65.0 in | Wt 190.4 lb

## 2021-05-17 DIAGNOSIS — R600 Localized edema: Secondary | ICD-10-CM | POA: Diagnosis not present

## 2021-05-17 DIAGNOSIS — I10 Essential (primary) hypertension: Secondary | ICD-10-CM

## 2021-05-17 DIAGNOSIS — I4891 Unspecified atrial fibrillation: Secondary | ICD-10-CM

## 2021-05-17 MED ORDER — FUROSEMIDE 20 MG PO TABS: 40.0000 mg | ORAL_TABLET | Freq: Every day | ORAL | 3 refills | Status: DC

## 2021-05-17 NOTE — Progress Notes (Signed)
[name] Dept: [phone] Dept Fax: [phone]  Chronic Care Office Visit  Subjective:    Patient ID: [name], male    DOB: 1924-10-12, 88 y.o..   MRN: [id number]  Chief Complaint  Patient presents with   Follow-up    4 week f/u. Average BP @ home 178/93 - 146/61.      History of Present Illness:  Patient is in today for reassessment of chronic medical issues.  [name] has a history of CAD and has had several prior open heart surgeries, including CABG and aortic valve replacement (tissue valve). He has had some hypertension and is managed on amlodipine and hydralazine. He has a history of hyperlipidemia and is on Lipitor. He is also managed on Lasix and carvedilol related to his heart disease.   [name] had an issue with hypertensive urgency in early December. After a hospital admission, his meds were adjusted to amlodipine 2.5 mg daily, carvedilol 6.25 mg daily, furosemide 20 mg daily, and hydralazine 50 mg TID. He has been checking his blood pressure at home. His average over the past 7 days has been 159/70.  [name] has had some ongoing issues with pedal edema. He feels this is stable. He denies any dyspnea or his legs feeling uncomfortable. He denies any orthostatic symptoms.  Past Medical History: Patient Active Problem List   Diagnosis Date Noted   Pedal edema 04/16/2021   CKD (chronic kidney disease), stage III ([place]) 04/09/2021   Hypertensive urgency 04/08/2021   Glaucoma 11/30/2020   Atrial fibrillation ([place]) 11/12/2013   Carotid artery disease ([place]) 11/12/2013   Claudication ([place]) 12/12/2012   Peripheral vascular disease ([place]) 12/12/2012   Carotid bruit 08/23/2011   Atrial fibrillation with RVR ([place]) 01/12/2011   Essential hypertension    Hyperlipidemia    Coronary artery disease    Status post aortic valve replacement with tissue    Past Surgical  History:  Procedure Laterality Date   APPENDECTOMY     CARDIAC CATHETERIZATION  04/02/2007   EF 60%/severe two-vessel obstructive coronary artery disease/patent saphenous bein graft to rt coronary/ severe stenosis at the ostium of the lt internal mammary artery graft to the abtuse marginal branch/normal lt ventricular function/ severe aortic stenosis/normal rt heart pressures   CARDIAC CATHETERIZATION  07/25/2002   EF 65%/two-vessel obstructive atherosclerotic coronary artery disease/ patent saphenous vein graft to the distal rt coronary arter//patent lt internal mammary artery graft to the obtuse marginal branch/normal lt ventricular function   CARDIAC CATHETERIZATION  05/17/1991   EF65%/two-vessel ovstructive atherosclerotic coronary artery disease/good lt ventricular performance/compared with previous catheterization there is now total occlusion of the tr coronary artery at the prior angioplasty site/good collateral flow to the distal rt coronary artery is seen/there is also progressive disease in the lt circumflex vessel now to obstructive levels   CARDIAC CATHETERIZATION  07/26/1990   EF60%/borderline obstructive coronary artery disease in the mid lt circumflex & in the rt coronary artery prior angioplasty site/normal lt ventricular function   CARDIAC CATHETERIZATION  05/23/1990   successful percutaneous transluminal coronary angioplasty of the proximal rt coronary artery   CATARACT EXTRACTION W/ INTRAOCULAR LENS IMPLANT Bilateral    CORONARY ANGIOPLASTY  07/12/1990   single vessel obstructive atherosclerotic coronary atrery disease in the rt corornary artery with restenosis of the primary angioplasty site/successful repeat PTCA of the proximal rt coronary arter   CORONARY ARTERY BYPASS GRAFT     CORONARY ARTERY  BYPASS GRAFT     x2 using a reverse saphenous vein graft to posterior descending, reseverse saphenous vein graft to lt internal mammary artery at the previous bypass to the circumflex  with a no vein harvesting   GANGLION CYST EXCISION Right    Wrist   HEMORRHOID SURGERY     at age 88   [place]  05/02/1965   PENILE PROSTHESIS IMPLANT  05/03/1987   STERNOTOMY     redo median with aortic valve replacement with a pericardial tissue valve/[employer] model 3000,33mm,serial numver [id number]   TONSILLECTOMY     at age 73   VASECTOMY     at age 35   Family History  Problem Relation Age of Onset   Cancer Father 33       intestinal   Heart attack Mother 31   Stroke Sister     Outpatient Medications Prior to Visit  Medication Sig Dispense Refill   amLODipine (NORVASC) 2.5 MG tablet Take 1 tablet (2.5 mg total) by mouth daily. 30 tablet 0   aspirin (ASPIRIN CHILDRENS) 81 MG chewable tablet Chew 1 tablet (81 mg total) by mouth daily. 36 tablet 11   atorvastatin (LIPITOR) 40 MG tablet TAKE 1 TABLET BY MOUTH EVERY DAY 90 tablet 3   carvedilol (COREG) 6.25 MG tablet Take 1 tablet (6.25 mg total) by mouth 2 (two) times daily with a meal. 180 tablet 3   COMBIGAN 0.2-0.5 % ophthalmic solution Place 1 drop into the left eye every 12 (twelve) hours.      hydrALAZINE (APRESOLINE) 50 MG tablet Take 1 tablet (50 mg total) by mouth 3 (three) times daily. 270 tablet 3   nitroGLYCERIN (NITROSTAT) 0.4 MG SL tablet Place 1 tablet (0.4 mg total) under the tongue every 5 (five) minutes as needed. (Patient taking differently: Place 0.4 mg under the tongue every 5 (five) minutes as needed for chest pain.) 25 tablet 1   TRAVATAN Z 0.004 % SOLN ophthalmic solution Place 1 drop into the left eye at bedtime.      furosemide (LASIX) 20 MG tablet Take 1 tablet (20 mg total) by mouth daily. 90 tablet 3   No facility-administered medications prior to visit.   Allergies  Allergen Reactions   Codeine     Hallucinations   Penicillins Hives    Objective:   Today's Vitals   05/17/21 0910 05/17/21 0920  BP: (!) 162/72 (!) 162/76  Pulse: 73   Temp: (!) 97 F (36.1 C)    TempSrc: Temporal   SpO2: 96%   Weight: 190 lb 6.4 oz (86.4 kg)   Height: 5\' 5"  (1.651 m)    Body mass index is 31.68 kg/m.   General: Well developed, well nourished. No acute distress. Lungs: Clear to auscultation bilaterally. No wheezing, rales or rhonchi. CV: RRR without murmurs or rubs. Very occasional early beat. Pulses 2+ bilaterally. Extremities: 3+ lower leg edema. Psych: Alert and oriented. Normal mood and affect.  Health Maintenance Due  Topic Date Due   TETANUS/TDAP  Never done   Zoster Vaccines- Shingrix (1 of 2) Never done   Pneumonia Vaccine 76+ Years old (2 - PPSV23 if available, else PCV20) 02/13/2019   COVID-19 Vaccine (4 - Booster for Pfizer series) 03/29/2021     Assessment & Plan:   1. Essential hypertension Blood pressure is adequately controlled at this point. Any tighter control would likely result in a return of orthostasis and falls. I will plan to check him again in  2 months.  2. Pedal edema Mr. Taguchi has more edema today and his weight is up 7 lbs. compared to last month. I will icnrease his Lasix to 40 mg daily.  - furosemide (LASIX) 20 MG tablet; Take 2 tablets (40 mg total) by mouth daily.  Dispense: 180 tablet; Refill: 3  3. Atrial fibrillation with RVR (HCC) Stable.  Haydee Salter, MD

## 2021-05-31 ENCOUNTER — Other Ambulatory Visit: Payer: Self-pay | Admitting: Cardiology

## 2021-06-11 ENCOUNTER — Encounter (HOSPITAL_COMMUNITY): Payer: Self-pay | Admitting: Emergency Medicine

## 2021-06-11 ENCOUNTER — Emergency Department (HOSPITAL_COMMUNITY)
Admission: EM | Admit: 2021-06-11 | Discharge: 2021-06-11 | Disposition: A | Discharge status: Home / Self Care | Payer: PPO | Attending: Emergency Medicine | Admitting: Emergency Medicine

## 2021-06-11 ENCOUNTER — Emergency Department (HOSPITAL_COMMUNITY): Payer: PPO

## 2021-06-11 ENCOUNTER — Other Ambulatory Visit: Payer: Self-pay

## 2021-06-11 ENCOUNTER — Ambulatory Visit (INDEPENDENT_AMBULATORY_CARE_PROVIDER_SITE_OTHER): Payer: PPO | Admitting: Family Medicine

## 2021-06-11 ENCOUNTER — Encounter: Payer: Self-pay | Admitting: Family Medicine

## 2021-06-11 ENCOUNTER — Ambulatory Visit
Admission: EM | Admit: 2021-06-11 | Discharge: 2021-06-11 | Disposition: A | Discharge status: Home / Self Care | Payer: PPO

## 2021-06-11 VITALS — BP 154/66 | HR 65 | Temp 97.2°F | Ht 65.0 in | Wt 191.2 lb

## 2021-06-11 DIAGNOSIS — Z23 Encounter for immunization: Secondary | ICD-10-CM | POA: Insufficient documentation

## 2021-06-11 DIAGNOSIS — I10 Essential (primary) hypertension: Secondary | ICD-10-CM

## 2021-06-11 DIAGNOSIS — Z7982 Long term (current) use of aspirin: Secondary | ICD-10-CM | POA: Diagnosis not present

## 2021-06-11 DIAGNOSIS — I4891 Unspecified atrial fibrillation: Secondary | ICD-10-CM

## 2021-06-11 DIAGNOSIS — S0101XA Laceration without foreign body of scalp, initial encounter: Secondary | ICD-10-CM

## 2021-06-11 DIAGNOSIS — N1831 Chronic kidney disease, stage 3a: Secondary | ICD-10-CM

## 2021-06-11 DIAGNOSIS — R944 Abnormal results of kidney function studies: Secondary | ICD-10-CM | POA: Diagnosis not present

## 2021-06-11 DIAGNOSIS — S0990XA Unspecified injury of head, initial encounter: Secondary | ICD-10-CM

## 2021-06-11 DIAGNOSIS — W08XXXA Fall from other furniture, initial encounter: Secondary | ICD-10-CM | POA: Insufficient documentation

## 2021-06-11 DIAGNOSIS — W19XXXA Unspecified fall, initial encounter: Secondary | ICD-10-CM

## 2021-06-11 DIAGNOSIS — Z79899 Other long term (current) drug therapy: Secondary | ICD-10-CM | POA: Insufficient documentation

## 2021-06-11 DIAGNOSIS — R600 Localized edema: Secondary | ICD-10-CM | POA: Diagnosis not present

## 2021-06-11 DIAGNOSIS — R7989 Other specified abnormal findings of blood chemistry: Secondary | ICD-10-CM | POA: Diagnosis not present

## 2021-06-11 DIAGNOSIS — I2510 Atherosclerotic heart disease of native coronary artery without angina pectoris: Secondary | ICD-10-CM | POA: Insufficient documentation

## 2021-06-11 LAB — BASIC METABOLIC PANEL
Anion gap: 9 (ref 5–15)
BUN: 29 mg/dL — ABNORMAL HIGH (ref 8–23)
CO2: 27 mmol/L (ref 22–32)
Calcium: 8.6 mg/dL — ABNORMAL LOW (ref 8.9–10.3)
Chloride: 106 mmol/L (ref 98–111)
Creatinine, Ser: 1.7 mg/dL — ABNORMAL HIGH (ref 0.61–1.24)
GFR, Estimated: 37 mL/min — ABNORMAL LOW (ref >60)
Glucose, Bld: 124 mg/dL — ABNORMAL HIGH (ref 70–99)
Potassium: 4.3 mmol/L (ref 3.5–5.1)
Sodium: 142 mmol/L (ref 135–145)

## 2021-06-11 LAB — CBC
HCT: 34.7 % — ABNORMAL LOW (ref 39.0–52.0)
Hemoglobin: 11 g/dL — ABNORMAL LOW (ref 13.0–17.0)
MCH: 29.2 pg (ref 26.0–34.0)
MCHC: 31.7 g/dL (ref 30.0–36.0)
MCV: 92 fL (ref 80.0–100.0)
Platelets: 164 10*3/uL (ref 150–400)
RBC: 3.77 MIL/uL — ABNORMAL LOW (ref 4.22–5.81)
RDW: 15.1 % (ref 11.5–15.5)
WBC: 6.1 10*3/uL (ref 4.0–10.5)
nRBC: 0 % (ref 0.0–0.2)

## 2021-06-11 LAB — TROPONIN I (HIGH SENSITIVITY): Troponin I (High Sensitivity): 19 ng/L — ABNORMAL HIGH (ref <18)

## 2021-06-11 MED ORDER — TETANUS-DIPHTH-ACELL PERTUSSIS 5-2.5-18.5 LF-MCG/0.5 IM SUSY
0.5000 mL | PREFILLED_SYRINGE | Freq: Once | INTRAMUSCULAR | Status: AC
Start: 2021-06-11 — End: 2021-06-11
  Administered 2021-06-11: 0.5 mL via INTRAMUSCULAR
  Filled 2021-06-11: qty 0.5

## 2021-06-11 NOTE — ED Triage Notes (Addendum)
Patient brought in by son with complaint of fall after getting dizzy and hitting his head against a door causing an approximately 6cm laceration to top of head. Patient did not lose consciousness, is not taking an anticoagulant. Patient is alert, oriented, and in no apparent distress at this time.

## 2021-06-11 NOTE — Progress Notes (Signed)
[facility] [address] [place] [address] Dept: [phone] Dept Fax: [phone]  Chronic Care Office Visit  Subjective:    Patient ID: [name], male    DOB: [birth year], 88 y.o..   MRN: [id number]  Chief Complaint  Patient presents with   Follow-up    3 month f/u. Average BP at home 150/75.    History of Present Illness:  Patient is in today for reassessment of chronic medical issues.  Mr. Relles has a history of CAD and has had several prior open heart surgeries, including CABG and aortic valve replacement (tissue valve). He has hypertension and is managed on amlodipine and hydralazine. He has a history of hyperlipidemia and is on Lipitor. He is also managed on Lasix and carvedilol related to his heart disease. He denies any dyspnea or his legs feeling uncomfortable, though he has some edema. His son notes that he tends to sit with his legs down quite a bit. He denies any orthostatic symptoms.  Past Medical History: Patient Active Problem List   Diagnosis Date Noted   Pedal edema 04/16/2021   CKD (chronic kidney disease), stage III ([place]) 04/09/2021   Hypertensive urgency 04/08/2021   Glaucoma 11/30/2020   Carotid artery disease ([place]) 11/12/2013   Claudication ([place]) 12/12/2012   Peripheral vascular disease ([place]) 12/12/2012   Carotid bruit 08/23/2011   Atrial fibrillation with RVR ([place]) 01/12/2011   Essential hypertension    Hyperlipidemia    Coronary artery disease    Status post aortic valve replacement with tissue    Past Surgical History:  Procedure Laterality Date   APPENDECTOMY     CARDIAC CATHETERIZATION  04/02/2007   EF 60%/severe two-vessel obstructive coronary artery disease/patent saphenous bein graft to rt coronary/ severe stenosis at the ostium of the lt internal mammary artery graft to the abtuse marginal branch/normal lt ventricular function/ severe aortic stenosis/normal rt heart  pressures   CARDIAC CATHETERIZATION  07/25/2002   EF 65%/two-vessel obstructive atherosclerotic coronary artery disease/ patent saphenous vein graft to the distal rt coronary arter//patent lt internal mammary artery graft to the obtuse marginal branch/normal lt ventricular function   CARDIAC CATHETERIZATION  05/17/1991   EF65%/two-vessel ovstructive atherosclerotic coronary artery disease/good lt ventricular performance/compared with previous catheterization there is now total occlusion of the tr coronary artery at the prior angioplasty site/good collateral flow to the distal rt coronary artery is seen/there is also progressive disease in the lt circumflex vessel now to obstructive levels   CARDIAC CATHETERIZATION  07/26/1990   EF60%/borderline obstructive coronary artery disease in the mid lt circumflex & in the rt coronary artery prior angioplasty site/normal lt ventricular function   CARDIAC CATHETERIZATION  05/23/1990   successful percutaneous transluminal coronary angioplasty of the proximal rt coronary artery   CATARACT EXTRACTION W/ INTRAOCULAR LENS IMPLANT Bilateral    CORONARY ANGIOPLASTY  07/12/1990   single vessel obstructive atherosclerotic coronary atrery disease in the rt corornary artery with restenosis of the primary angioplasty site/successful repeat PTCA of the proximal rt coronary arter   CORONARY ARTERY BYPASS GRAFT     CORONARY ARTERY BYPASS GRAFT     x2 using a reverse saphenous vein graft to posterior descending, reseverse saphenous vein graft to lt internal mammary artery at the previous bypass to the circumflex with a no vein harvesting   GANGLION CYST EXCISION Right    Wrist   HEMORRHOID SURGERY     at age 88   [place]  05/02/1965  PENILE PROSTHESIS IMPLANT  05/03/1987   STERNOTOMY     redo median with aortic valve replacement with a pericardial tissue valve/[employer] model 3000,54mm,serial numver [id number]   TONSILLECTOMY     at age 28    VASECTOMY     at age 26   Family History  Problem Relation Age of Onset   Cancer Father 69       intestinal   Heart attack Mother 1   Stroke Sister    Outpatient Medications Prior to Visit  Medication Sig Dispense Refill   amLODipine (NORVASC) 2.5 MG tablet TAKE 1 TABLET BY MOUTH EVERY DAY 30 tablet 1   aspirin (ASPIRIN CHILDRENS) 81 MG chewable tablet Chew 1 tablet (81 mg total) by mouth daily. 36 tablet 11   atorvastatin (LIPITOR) 40 MG tablet TAKE 1 TABLET BY MOUTH EVERY DAY 90 tablet 3   carvedilol (COREG) 6.25 MG tablet Take 1 tablet (6.25 mg total) by mouth 2 (two) times daily with a meal. 180 tablet 3   COMBIGAN 0.2-0.5 % ophthalmic solution Place 1 drop into the left eye every 12 (twelve) hours.      furosemide (LASIX) 20 MG tablet Take 2 tablets (40 mg total) by mouth daily. 180 tablet 3   hydrALAZINE (APRESOLINE) 50 MG tablet Take 1 tablet (50 mg total) by mouth 3 (three) times daily. 270 tablet 3   nitroGLYCERIN (NITROSTAT) 0.4 MG SL tablet Place 1 tablet (0.4 mg total) under the tongue every 5 (five) minutes as needed. (Patient taking differently: Place 0.4 mg under the tongue every 5 (five) minutes as needed for chest pain.) 25 tablet 1   TRAVATAN Z 0.004 % SOLN ophthalmic solution Place 1 drop into the left eye at bedtime.      No facility-administered medications prior to visit.   Allergies  Allergen Reactions   Codeine     Hallucinations   Penicillins Hives    Objective:   Today's Vitals   06/11/21 0821  BP: (!) 154/66  Pulse: 65  Temp: (!) 97.2 F (36.2 C)  TempSrc: Temporal  SpO2: 95%  Weight: 191 lb 3.2 oz (86.7 kg)  Height: 5\' 5"  (1.651 m)   Body mass index is 31.82 kg/m.   General: Well developed, well nourished. No acute distress. Extremities: 2-3+ edema noted. Psych: Alert and oriented. Normal mood and affect.  Health Maintenance Due  Topic Date Due   TETANUS/TDAP  Never done   Zoster Vaccines- Shingrix (1 of 2) Never done   Pneumonia  Vaccine 8+ Years old (2 - PPSV23 if available, else PCV20) 02/13/2019   COVID-19 Vaccine (4 - Booster for Pfizer series) 03/29/2021   Lab Results BMP Latest Ref Rng & Units 04/12/2021 04/10/2021 04/09/2021  Glucose 70 - 99 mg/dL 96 115(H) 96  BUN 8 - 23 mg/dL 24(H) 20 22  Creatinine 0.61 - 1.24 mg/dL 1.54(H) 1.42(H) 1.34(H)  BUN/Creat Ratio 10 - 24 - - -  Sodium 135 - 145 mmol/L 138 138 140  Potassium 3.5 - 5.1 mmol/L 4.2 4.2 4.0  Chloride 98 - 111 mmol/L 104 105 108  CO2 22 - 32 mmol/L 25 26 25   Calcium 8.9 - 10.3 mg/dL 8.8(L) 8.7(L) 8.5(L)     Assessment & Plan:   1. Essential hypertension The systolic blood pressure is elevated and there is a wide pulse pressure (88 mmHg). Although this may indicated an increased risk for CV events, he has been intolerant of more agressive blood pressure control. I feel we should  maintain his amlodipine and hydralazine at the current levels.  2. Atrial fibrillation with RVR (HCC) Pulse is well controlled. Continue a daily aspirin.  3. Stage 3a chronic kidney disease ([place]) We will continue to monitor this with time. I will plan a repeat BMP at his next visit.  4. Pedal edema Edema is stable and tolerable. More agressive diuretic use has caused ARF in the past. I reminded him of the need to elevate his legs frequently and to consider use of compression stockings.  Haydee Salter, MD

## 2021-06-11 NOTE — ED Provider Triage Note (Signed)
Emergency Medicine Provider Triage Evaluation Note  [name] , a 88 y.o. male  was evaluated in triage.  Pt complains of laceration to scalp.  Patient states he stood up too fast, stumbled into the door jam resulting in laceration to his scalp.  Denies loss of consciousness, feels fine at this point in time.  Not anticoagulated.  Review of Systems  Positive: Head lac Negative: LOC, neck/back/extremity pain  Physical Exam  BP (!) 154/64 (BP Location: Left Arm)    Pulse 66    Temp 98.2 F (36.8 C) (Oral)    Resp 16    SpO2 96%  Gen:   Awake, no distress   Resp:  Normal effort  MSK:   Moves extremities without difficulty  Other:  Large lac to posterior scalp, bleeding controlled, no pain with ROM extremities, no midline neck/back pain    Medical Decision Making  Medically screening exam initiated at 12:50 PM.  Appropriate orders placed.  [name] was informed that the remainder of the evaluation will be completed by another provider, this initial triage assessment does not replace that evaluation, and the importance of remaining in the ED until their evaluation is complete.     Corey Learn, PA-C 06/11/21 1309

## 2021-06-11 NOTE — Discharge Instructions (Addendum)
Follow up with your PCP regarding ED visit today. Your kidney function was very slightly elevated today. Please drink plenty of water and have this level rechecked by your PCP in 1-2 weeks.   Please have your sutures removed in 1 week. You can return to the ED, go to any urgent care, or follow up with your PCP for removal.   Return to the ED for any signs of infection to the wound including redness/swelling around the wound, drainage of pus, or fevers > 100.4

## 2021-06-11 NOTE — Discharge Instructions (Signed)
Please go to the emergency department as soon as you leave urgent care for further evaluation and management. ?

## 2021-06-11 NOTE — ED Provider Notes (Signed)
[facility] EMERGENCY DEPARTMENT Provider Note   CSN: [id number] Arrival date & time: 06/11/21  1236     History  Chief Complaint  Patient presents with   [name] is a 88 y.o. male with Pmhx HTN, HLD, PAD on aspirin, and CAD who presents to the ED today from UC for further evaluation of fall.  Patient reports that he was lying on the couch for a nap.  He thinks he woke up too quickly and tried to get up and felt slightly dizzy and fell hitting his head in the process.  He denies passing out afterwards.  He went to urgent care however given age, head trauma he was advised to come to the ED for further evaluation and for CT imaging.  Denies any prodrome to the fall.  He admits that he had eaten a big breakfast however when he woke up he felt very hungry and was on his way to lunch when he got up too quickly and fell.  He currently has no complaints besides pain to his head from a laceration. Tetanus status unknown.   The history is provided by the patient and medical records.      Home Medications Prior to Admission medications   Medication Sig Start Date End Date Taking? Authorizing Provider  amLODipine (NORVASC) 2.5 MG tablet TAKE 1 TABLET BY MOUTH EVERY DAY 05/31/21   [place], Peter M, MD  aspirin (ASPIRIN CHILDRENS) 81 MG chewable tablet Chew 1 tablet (81 mg total) by mouth daily. 01/19/11   Burtis Junes, NP  atorvastatin (LIPITOR) 40 MG tablet TAKE 1 TABLET BY MOUTH EVERY DAY 05/05/21   [place], Peter M, MD  carvedilol (COREG) 6.25 MG tablet Take 1 tablet (6.25 mg total) by mouth 2 (two) times daily with a meal. 05/11/21   Haydee Salter, MD  COMBIGAN 0.2-0.5 % ophthalmic solution Place 1 drop into the left eye every 12 (twelve) hours.  12/06/12   [provider]  furosemide (LASIX) 20 MG tablet Take 2 tablets (40 mg total) by mouth daily. 05/17/21 05/12/22  Haydee Salter, MD  hydrALAZINE (APRESOLINE) 50 MG tablet Take 1 tablet (50 mg total) by  mouth 3 (three) times daily. 05/05/21 08/03/21  [place], Peter M, MD  nitroGLYCERIN (NITROSTAT) 0.4 MG SL tablet Place 1 tablet (0.4 mg total) under the tongue every 5 (five) minutes as needed. Patient taking differently: Place 0.4 mg under the tongue every 5 (five) minutes as needed for chest pain. 07/24/20   [place], Peter M, MD  TRAVATAN Z 0.004 % SOLN ophthalmic solution Place 1 drop into the left eye at bedtime.  12/06/12   [provider]      Allergies    Codeine and Penicillins    Review of Systems   Review of Systems  Constitutional:  Negative for chills and fever.  Eyes:  Negative for visual disturbance.  Respiratory:  Negative for shortness of breath.   Cardiovascular:  Negative for chest pain.  Gastrointestinal:  Negative for nausea and vomiting.  Skin:  Positive for wound.  Neurological:  Positive for light-headedness.  All other systems reviewed and are negative.  Physical Exam Updated Vital Signs BP (!) 154/64 (BP Location: Left Arm)    Pulse 66    Temp 98.2 F (36.8 C) (Oral)    Resp 16    SpO2 96%  Physical Exam Vitals and nursing note reviewed.  Constitutional:      Appearance: He  is not ill-appearing or diaphoretic.  HENT:     Head: Normocephalic.     Comments: 6 cm laceration to parietal scalp, midline with mild amount of active bleeding Eyes:     Extraocular Movements: Extraocular movements intact.     Conjunctiva/sclera: Conjunctivae normal.     Pupils: Pupils are equal, round, and reactive to light.  Cardiovascular:     Rate and Rhythm: Normal rate and regular rhythm.  Pulmonary:     Effort: Pulmonary effort is normal.     Breath sounds: Normal breath sounds. No wheezing, rhonchi or rales.  Abdominal:     Palpations: Abdomen is soft.     Tenderness: There is no abdominal tenderness.  Musculoskeletal:     Cervical back: Neck supple.  Skin:    General: Skin is warm and dry.  Neurological:     Mental Status: He is alert.     Comments: Alert and  oriented to self, place, time and event.   Speech is fluent, clear without dysarthria or dysphasia.   Strength 5/5 in upper/lower extremities   Sensation intact in upper/lower extremities   Normal gait.  Negative Romberg. No pronator drift.  Normal finger-to-nose and feet tapping.  CN I not tested  CN II grossly intact visual fields bilaterally. Did not visualize posterior eye.  CN III, IV, VI PERRLA and EOMs intact bilaterally  CN V Intact sensation to sharp and light touch to the face  CN VII facial movements symmetric  CN VIII not tested  CN IX, X no uvula deviation, symmetric rise of soft palate  CN XI 5/5 SCM and trapezius strength bilaterally  CN XII Midline tongue protrusion, symmetric L/R movements      ED Results / Procedures / Treatments   Labs (all labs ordered are listed, but only abnormal results are displayed) Labs Reviewed  BASIC METABOLIC PANEL - Abnormal; Notable for the following components:      Result Value   Glucose, Bld 124 (*)    BUN 29 (*)    Creatinine, Ser 1.70 (*)    Calcium 8.6 (*)    GFR, Estimated 37 (*)    All other components within normal limits  CBC - Abnormal; Notable for the following components:   RBC 3.77 (*)    Hemoglobin 11.0 (*)    HCT 34.7 (*)    All other components within normal limits  TROPONIN I (HIGH SENSITIVITY) - Abnormal; Notable for the following components:   Troponin I (High Sensitivity) 19 (*)    All other components within normal limits  CBG MONITORING, ED    EKG EKG Interpretation  Date/Time:  Friday June 11 2021 12:51:17 EST Ventricular Rate:  68 PR Interval:  196 QRS Duration: 134 QT Interval:  438 QTC Calculation: 465 R Axis:   78 Text Interpretation: Normal sinus rhythm Non-specific intra-ventricular conduction block Cannot rule out Anterior infarct , age undetermined T wave abnormality, consider inferior ischemia Abnormal ECG When compared with ECG of 08-Apr-2021 14:14, PREVIOUS ECG IS PRESENT t  wave changes are new Confirmed by Isla Pence [phone]) on 06/11/2021 2:51:49 PM  Radiology CT Head Wo Contrast  Result Date: 06/11/2021 CLINICAL DATA:  A 88 year old male presents with neck trauma. EXAM: CT HEAD WITHOUT CONTRAST CT CERVICAL SPINE WITHOUT CONTRAST TECHNIQUE: Multidetector CT imaging of the head and cervical spine was performed following the standard protocol without intravenous contrast. Multiplanar CT image reconstructions of the cervical spine were also generated. RADIATION DOSE REDUCTION: This exam was performed according  to the departmental dose-optimization program which includes automated exposure control, adjustment of the mA and/or kV according to patient size and/or use of iterative reconstruction technique. COMPARISON:  April 02, 2021 FINDINGS: CT HEAD FINDINGS Brain: No evidence of acute infarction, hemorrhage, hydrocephalus, extra-axial collection or mass lesion/mass effect. Mild atrophy and chronic microvascular ischemic change as before. Vascular: No hyperdense vessel or unexpected calcification. Skull: Normal. Negative for fracture or focal lesion. Sinuses/Orbits: No acute finding. Other: None CT CERVICAL SPINE FINDINGS Alignment: 2-3 mm anterolisthesis of C5 on C6 in the setting of degenerative change. Skull base and vertebrae: No acute fracture. No primary bone lesion or focal pathologic process. Soft tissues and spinal canal: No prevertebral fluid or swelling. No visible canal hematoma. Disc levels: Multilevel degenerative changes greatest about the dens and and at C5-6, C6-7 and C7-T1. Upper chest: Negative. Other: None IMPRESSION: 1. No acute intracranial abnormality. 2. No acute fracture or static subluxation of the cervical spine. 3. Multilevel degenerative changes of the cervical spine. Electronically Signed   By: Zetta Bills M.D.   On: 06/11/2021 14:01   CT Cervical Spine Wo Contrast  Result Date: 06/11/2021 CLINICAL DATA:  A 88 year old male presents with neck  trauma. EXAM: CT HEAD WITHOUT CONTRAST CT CERVICAL SPINE WITHOUT CONTRAST TECHNIQUE: Multidetector CT imaging of the head and cervical spine was performed following the standard protocol without intravenous contrast. Multiplanar CT image reconstructions of the cervical spine were also generated. RADIATION DOSE REDUCTION: This exam was performed according to the departmental dose-optimization program which includes automated exposure control, adjustment of the mA and/or kV according to patient size and/or use of iterative reconstruction technique. COMPARISON:  April 02, 2021 FINDINGS: CT HEAD FINDINGS Brain: No evidence of acute infarction, hemorrhage, hydrocephalus, extra-axial collection or mass lesion/mass effect. Mild atrophy and chronic microvascular ischemic change as before. Vascular: No hyperdense vessel or unexpected calcification. Skull: Normal. Negative for fracture or focal lesion. Sinuses/Orbits: No acute finding. Other: None CT CERVICAL SPINE FINDINGS Alignment: 2-3 mm anterolisthesis of C5 on C6 in the setting of degenerative change. Skull base and vertebrae: No acute fracture. No primary bone lesion or focal pathologic process. Soft tissues and spinal canal: No prevertebral fluid or swelling. No visible canal hematoma. Disc levels: Multilevel degenerative changes greatest about the dens and and at C5-6, C6-7 and C7-T1. Upper chest: Negative. Other: None IMPRESSION: 1. No acute intracranial abnormality. 2. No acute fracture or static subluxation of the cervical spine. 3. Multilevel degenerative changes of the cervical spine. Electronically Signed   By: Zetta Bills M.D.   On: 06/11/2021 14:01    Procedures .Marland KitchenLaceration Repair  Date/Time: 06/11/2021 3:05 PM Performed by: Eustaquio Maize, PA-C Authorized by: Eustaquio Maize, PA-C   Consent:    Consent obtained:  Verbal   Consent given by:  Patient   Risks discussed:  Infection, pain and poor cosmetic result Laceration details:     Location:  Scalp   Scalp location:  Crown   Length (cm):  6   Depth (mm):  2 Pre-procedure details:    Preparation:  Imaging obtained to evaluate for foreign bodies Treatment:    Area cleansed with:  Povidone-iodine   Irrigation solution:  Sterile saline Skin repair:    Repair method:  Staples   Number of staples:  6 Approximation:    Approximation:  Close Repair type:    Repair type:  Intermediate Post-procedure details:    Dressing:  Non-adherent dressing   Procedure completion:  Tolerated well, no immediate complications  Medications Ordered in ED Medications  Tdap (BOOSTRIX) injection 0.5 mL (has no administration in time range)    ED Course/ Medical Decision Making/ A&P                           Medical Decision Making 88 year old male who presents to the ED today from urgent care after sustaining head injury after getting up off the couch too quickly and having lightheadedness.  On arrival to the ED today vitals are stable.  Patient's work-up was started including EKG with nonspecific T wave changes.  CBC without leukocytosis and stable hemoglobin 11.0.  BMP with a creatinine of 1.70 and BUN 29 which is slightly increased from baseline around 1.40.  No other electrolyte abnormalities.  CT head and CT C-spine without acute findings.  My exam patient is alert and oriented x4.  He is in good spirits and laughing.  He has no focal neurodeficits on exam today.  Will require staples at this time.  Given EKG changes we will add on 1 troponin.  If negative we will plan to discharge home as symptoms do sound consistent with vagal response. Attending physician Dr. Gilford Raid has evaluated patient as well and agrees with plan.   Problems Addressed: Elevated serum creatinine: acute illness or injury    Details: Pt to push fluids and have this level rechecked by PCP in 1-2 weeks Fall, initial encounter: acute illness or injury    Details: Appears to be vaso vagal in response. No  prodrome. CT head and CT C spine negative. Do not feel pt requires admission for same. Pt recommend follow up with PCP. Laceration of scalp, initial encounter: acute illness or injury    Details: Staples placed. Pt instructed to return in 1 week for staple removal.  Amount and/or Complexity of Data Reviewed Labs: ordered.    Details: Troponin of 19. Previous at 28. Low suspicion for ACS.  Risk Prescription drug management.          Final Clinical Impression(s) / ED Diagnoses Final diagnoses:  Laceration of scalp, initial encounter  Fall, initial encounter  Elevated serum creatinine    Rx / DC Orders ED Discharge Orders     None        Discharge Instructions      Follow up with your PCP regarding ED visit today. Your kidney function was very slightly elevated today. Please drink plenty of water and have this level rechecked by your PCP in 1-2 weeks.   Please have your sutures removed in 1 week. You can return to the ED, go to any urgent care, or follow up with your PCP for removal.   Return to the ED for any signs of infection to the wound including redness/swelling around the wound, drainage of pus, or fevers > 100.4       Eustaquio Maize, PA-C 06/11/21 [place], Julie, MD 06/12/21 [phone]

## 2021-06-11 NOTE — ED Triage Notes (Signed)
Pt c/o fall at home now has laceration on crown several inches in length. Did take aspirin this morning. A&Ox4 but unable to remember what he was doing when he fell and how he hit the top of the head.

## 2021-06-11 NOTE — ED Provider Notes (Signed)
[facility]    CSN: [id number] Arrival date & time: 06/11/21  1147      History   Chief Complaint No chief complaint on file.   HPI [name] is a 88 y.o. male.   Patient presented today for further evaluation after a fall that occurred approximately 30 minutes prior to arrival.  He reports that he fell and hit his head.  Patient's family member and patient are not sure exactly what happened during fall or how he fell.  Patient reports that he thinks that he hit his head on a door jam but is not sure.  Patient is not sure if he lost consciousness.  He has a laceration on the top of the head.  He reports that he takes aspirin but is not sure if he takes any other blood thinners.  Denies associated headache, blurred vision, dizziness, nausea, vomiting.    Past Medical History:  Diagnosis Date   Carotid arterial disease ([place])    Doppler in 2003 with 40 to 60% L ICA   Carpal tunnel syndrome    Coronary artery disease    s/p CABG & redo CABG with AVR   Hyperlipidemia    Hypertension    Meningitis    Normal echocardiogram Sept 2012   Has normal EF, mild AS and moderate LAE   PAF (paroxysmal atrial fibrillation) [facility]) Sept 2012   converted spontaneously   PUD (peptic ulcer disease)    Remote history   Status post aortic valve replacement with tissue    for aortic stenosis    Patient Active Problem List   Diagnosis Date Noted   Pedal edema 04/16/2021   CKD (chronic kidney disease), stage III ([place]) 04/09/2021   Hypertensive urgency 04/08/2021   Glaucoma 11/30/2020   Carotid artery disease ([place]) 11/12/2013   Claudication ([place]) 12/12/2012   Peripheral vascular disease ([place]) 12/12/2012   Carotid bruit 08/23/2011   Atrial fibrillation with RVR ([place]) 01/12/2011   Essential hypertension    Hyperlipidemia    Coronary artery disease    Status post aortic valve replacement with tissue     Past Surgical History:  Procedure Laterality Date   APPENDECTOMY      CARDIAC CATHETERIZATION  04/02/2007   EF 60%/severe two-vessel obstructive coronary artery disease/patent saphenous bein graft to rt coronary/ severe stenosis at the ostium of the lt internal mammary artery graft to the abtuse marginal branch/normal lt ventricular function/ severe aortic stenosis/normal rt heart pressures   CARDIAC CATHETERIZATION  07/25/2002   EF 65%/two-vessel obstructive atherosclerotic coronary artery disease/ patent saphenous vein graft to the distal rt coronary arter//patent lt internal mammary artery graft to the obtuse marginal branch/normal lt ventricular function   CARDIAC CATHETERIZATION  05/17/1991   EF65%/two-vessel ovstructive atherosclerotic coronary artery disease/good lt ventricular performance/compared with previous catheterization there is now total occlusion of the tr coronary artery at the prior angioplasty site/good collateral flow to the distal rt coronary artery is seen/there is also progressive disease in the lt circumflex vessel now to obstructive levels   CARDIAC CATHETERIZATION  07/26/1990   EF60%/borderline obstructive coronary artery disease in the mid lt circumflex & in the rt coronary artery prior angioplasty site/normal lt ventricular function   CARDIAC CATHETERIZATION  05/23/1990   successful percutaneous transluminal coronary angioplasty of the proximal rt coronary artery   CATARACT EXTRACTION W/ INTRAOCULAR LENS IMPLANT Bilateral    CORONARY ANGIOPLASTY  07/12/1990   single vessel obstructive atherosclerotic coronary atrery disease in the rt corornary artery  with restenosis of the primary angioplasty site/successful repeat PTCA of the proximal rt coronary arter   CORONARY ARTERY BYPASS GRAFT     CORONARY ARTERY BYPASS GRAFT     x2 using a reverse saphenous vein graft to posterior descending, reseverse saphenous vein graft to lt internal mammary artery at the previous bypass to the circumflex with a no vein harvesting   GANGLION CYST EXCISION  Right    Wrist   HEMORRHOID SURGERY     at age 87   [place]  05/02/1965   PENILE PROSTHESIS IMPLANT  05/03/1987   STERNOTOMY     redo median with aortic valve replacement with a pericardial tissue valve/[employer] model 3000,80mm,serial numver [id number]   TONSILLECTOMY     at age 34   VASECTOMY     at age 50       Home Medications    Prior to Admission medications   Medication Sig Start Date End Date Taking? Authorizing Provider  amLODipine (NORVASC) 2.5 MG tablet TAKE 1 TABLET BY MOUTH EVERY DAY 05/31/21   [place], Peter M, MD  aspirin (ASPIRIN CHILDRENS) 81 MG chewable tablet Chew 1 tablet (81 mg total) by mouth daily. 01/19/11   Burtis Junes, NP  atorvastatin (LIPITOR) 40 MG tablet TAKE 1 TABLET BY MOUTH EVERY DAY 05/05/21   [place], Peter M, MD  carvedilol (COREG) 6.25 MG tablet Take 1 tablet (6.25 mg total) by mouth 2 (two) times daily with a meal. 05/11/21   Haydee Salter, MD  COMBIGAN 0.2-0.5 % ophthalmic solution Place 1 drop into the left eye every 12 (twelve) hours.  12/06/12   [provider]  furosemide (LASIX) 20 MG tablet Take 2 tablets (40 mg total) by mouth daily. 05/17/21 05/12/22  Haydee Salter, MD  hydrALAZINE (APRESOLINE) 50 MG tablet Take 1 tablet (50 mg total) by mouth 3 (three) times daily. 05/05/21 08/03/21  [place], Peter M, MD  nitroGLYCERIN (NITROSTAT) 0.4 MG SL tablet Place 1 tablet (0.4 mg total) under the tongue every 5 (five) minutes as needed. Patient taking differently: Place 0.4 mg under the tongue every 5 (five) minutes as needed for chest pain. 07/24/20   [place], Peter M, MD  TRAVATAN Z 0.004 % SOLN ophthalmic solution Place 1 drop into the left eye at bedtime.  12/06/12   [provider]    Family History Family History  Problem Relation Age of Onset   Cancer Father 40       intestinal   Heart attack Mother 24   Stroke Sister     Social History Social History   Tobacco Use   Smoking status: Former     Packs/day: 1.50    Years: 41.00    Pack years: 61.50    Types: Cigarettes, Cigars    Quit date: 05/02/1982    Years since quitting: 39.1   Smokeless tobacco: Never  Substance Use Topics   Alcohol use: No   Drug use: No     Allergies   Codeine and Penicillins   Review of Systems Review of Systems Per HPI  Physical Exam Triage Vital Signs ED Triage Vitals [06/11/21 1157]  Enc Vitals Group     BP (!) 166/84     Pulse Rate 68     Resp 18     Temp 98 F (36.7 C)     Temp Source Oral     SpO2 95 %     Weight      Height  Head Circumference      Peak Flow      Pain Score 0     Pain Loc      Pain Edu?      Excl. in [place]?    No data found.  Updated Vital Signs BP (!) 166/84 (BP Location: Left Arm)    Pulse 68    Temp 98 F (36.7 C) (Oral)    Resp 18    SpO2 95%   Visual Acuity Right Eye Distance:   Left Eye Distance:   Bilateral Distance:    Right Eye Near:   Left Eye Near:    Bilateral Near:     Physical Exam Constitutional:      General: He is not in acute distress.    Appearance: Normal appearance. He is not toxic-appearing or diaphoretic.  HENT:     Head: Normocephalic and atraumatic.  Eyes:     Extraocular Movements: Extraocular movements intact.     Conjunctiva/sclera: Conjunctivae normal.  Pulmonary:     Effort: Pulmonary effort is normal.  Skin:    Comments: Patient has approximately 3 to 4 inch linear laceration that is superficial present to top/posterior scalp.  Neurological:     General: No focal deficit present.     Mental Status: He is alert and oriented to person, place, and time. Mental status is at baseline.     Sensory: Sensation is intact.     Motor: Motor function is intact.     Coordination: Coordination is intact.     Gait: Gait is intact.     Comments: Left pupil is slightly larger than right pupil.  They are both reactive.  Patient walks with cane at baseline.  Psychiatric:        Mood and Affect: Mood normal.         Behavior: Behavior normal.        Thought Content: Thought content normal.        Judgment: Judgment normal.     UC Treatments / Results  Labs (all labs ordered are listed, but only abnormal results are displayed) Labs Reviewed - No data to display  EKG   Radiology No results found.  Procedures Procedures (including critical care time)  Medications Ordered in UC Medications - No data to display  Initial Impression / Assessment and Plan / UC Course  I have reviewed the triage vital signs and the nursing notes.  Pertinent labs & imaging results that were available during my care of the patient were reviewed by me and considered in my medical decision making (see chart for details).     Advised patient that he will need to the hospital for further evaluation and management given head injury as he may need CT imaging of the head to rule out worrisome etiologies.  Dressing was applied to scalp laceration.  Will defer closure of laceration to the emergency department once worrisome etiologies are ruled out.  Bleeding is currently controlled.  Patient's pupils are unequal but reactive.  He claims that this is normal.  Vital signs stable at discharge.  Patient was agreeable to going to the hospital.  Agree with patient's family member transporting him to the hospital. Final Clinical Impressions(s) / UC Diagnoses   Final diagnoses:  Injury of head, initial encounter  Laceration of scalp, initial encounter     Discharge Instructions      Please go to the emergency department as soon as you leave urgent care for further evaluation and management.  ED Prescriptions   None    PDMP not reviewed this encounter.   Teodora Medici, [place] 06/11/21 1241

## 2021-06-14 NOTE — Progress Notes (Signed)
[name] Date of Birth: [birth year] Medical Record [id number]  History of Present Illness: [name] is seen for follow up of CAD. He is s/p redo CABG and AVR in 2008.  He did have carotid dopplers in August 2017  which showed 40-59% bilateral ICA stenosis. Repeat in 2019 showed < 39% stenosis bilaterally.    Myoview study in May 2013 was normal. Echo in 2012 showed normal LV function and mild aortic stenosis. He also had LE arterial dopplers in August 2016 which showed mild vascular disease with good ABIs.   He was admitted in December with HA, severe HTN and mental status changes. Neuro imaging was negative. Echocardiogram shows preserved LVEF, grade 1 diastolic dysfunction, severe LAE, mod RAE, well-seated AVR without leak. Increased hydralazine 50mg  > 100mg  TID beginning 12/9. With addition of other medications he became orthostatic, so this was returned to PTA dosing with improvement.  Changed metoprolol to coreg.  Added norvasc 2.5mg  (hx LE swelling due to this at higher dose).  He was seen in the ED on 06/11/21 after getting up from a nap he fell. Had laceration of scalp. CT negative.   He is now 88 years old. States he does fall at times. No syncope. Does given out of breath easily. Legs give out. No chest pain. Does use NSAID for back pain at times. He has chronic swelling in his legs. Unable to put support hose on. Still crocheting for church.    Current Outpatient Medications on File Prior to Visit  Medication Sig Dispense Refill   amLODipine (NORVASC) 2.5 MG tablet TAKE 1 TABLET BY MOUTH EVERY DAY 30 tablet 1   aspirin (ASPIRIN CHILDRENS) 81 MG chewable tablet Chew 1 tablet (81 mg total) by mouth daily. 36 tablet 11   atorvastatin (LIPITOR) 40 MG tablet TAKE 1 TABLET BY MOUTH EVERY DAY 90 tablet 3   carvedilol (COREG) 6.25 MG tablet Take 1 tablet (6.25 mg total) by mouth 2 (two) times daily with a meal. 180 tablet 3   COMBIGAN 0.2-0.5 % ophthalmic solution Place 1 drop into the  left eye every 12 (twelve) hours.      furosemide (LASIX) 20 MG tablet Take 2 tablets (40 mg total) by mouth daily. 180 tablet 3   hydrALAZINE (APRESOLINE) 50 MG tablet Take 1 tablet (50 mg total) by mouth 3 (three) times daily. 270 tablet 3   TRAVATAN Z 0.004 % SOLN ophthalmic solution Place 1 drop into the left eye at bedtime.      nitroGLYCERIN (NITROSTAT) 0.4 MG SL tablet Place 1 tablet (0.4 mg total) under the tongue every 5 (five) minutes as needed. (Patient not taking: Reported on 06/17/2021) 25 tablet 1   No current facility-administered medications on file prior to visit.    Allergies  Allergen Reactions   Codeine     Hallucinations   Penicillins Hives    Past Medical History:  Diagnosis Date   Carotid arterial disease ([place])    Doppler in 2003 with 40 to 60% L ICA   Carpal tunnel syndrome    Coronary artery disease    s/p CABG & redo CABG with AVR   Hyperlipidemia    Hypertension    Meningitis    Normal echocardiogram Sept 2012   Has normal EF, mild AS and moderate LAE   PAF (paroxysmal atrial fibrillation) [facility]) Sept 2012   converted spontaneously   PUD (peptic ulcer disease)    Remote history   Status post aortic valve replacement with tissue  for aortic stenosis    Past Surgical History:  Procedure Laterality Date   APPENDECTOMY     CARDIAC CATHETERIZATION  04/02/2007   EF 60%/severe two-vessel obstructive coronary artery disease/patent saphenous bein graft to rt coronary/ severe stenosis at the ostium of the lt internal mammary artery graft to the abtuse marginal branch/normal lt ventricular function/ severe aortic stenosis/normal rt heart pressures   CARDIAC CATHETERIZATION  07/25/2002   EF 65%/two-vessel obstructive atherosclerotic coronary artery disease/ patent saphenous vein graft to the distal rt coronary arter//patent lt internal mammary artery graft to the obtuse marginal branch/normal lt ventricular function   CARDIAC CATHETERIZATION  05/17/1991    EF65%/two-vessel ovstructive atherosclerotic coronary artery disease/good lt ventricular performance/compared with previous catheterization there is now total occlusion of the tr coronary artery at the prior angioplasty site/good collateral flow to the distal rt coronary artery is seen/there is also progressive disease in the lt circumflex vessel now to obstructive levels   CARDIAC CATHETERIZATION  07/26/1990   EF60%/borderline obstructive coronary artery disease in the mid lt circumflex & in the rt coronary artery prior angioplasty site/normal lt ventricular function   CARDIAC CATHETERIZATION  05/23/1990   successful percutaneous transluminal coronary angioplasty of the proximal rt coronary artery   CATARACT EXTRACTION W/ INTRAOCULAR LENS IMPLANT Bilateral    CORONARY ANGIOPLASTY  07/12/1990   single vessel obstructive atherosclerotic coronary atrery disease in the rt corornary artery with restenosis of the primary angioplasty site/successful repeat PTCA of the proximal rt coronary arter   CORONARY ARTERY BYPASS GRAFT     CORONARY ARTERY BYPASS GRAFT     x2 using a reverse saphenous vein graft to posterior descending, reseverse saphenous vein graft to lt internal mammary artery at the previous bypass to the circumflex with a no vein harvesting   GANGLION CYST EXCISION Right    Wrist   HEMORRHOID SURGERY     at age 22   [place]  05/02/1965   PENILE PROSTHESIS IMPLANT  05/03/1987   STERNOTOMY     redo median with aortic valve replacement with a pericardial tissue valve/[employer] model 3000,4mm,serial numver [id number]   TONSILLECTOMY     at age 73   VASECTOMY     at age 36    Social History   Tobacco Use  Smoking Status Former   Packs/day: 1.50   Years: 41.00   Pack years: 61.50   Types: Cigarettes, Cigars   Quit date: 05/02/1982   Years since quitting: 39.1  Smokeless Tobacco Never    Social History   Substance and Sexual Activity  Alcohol Use No     Family History  Problem Relation Age of Onset   Cancer Father 26       intestinal   Heart attack Mother 41   Stroke Sister     Review of Systems: The review of systems is per the HPI.  All other systems were reviewed and are negative.  Physical Exam: BP (!) 166/62 (BP Location: Left Arm, Patient Position: Sitting, Cuff Size: Large)    Pulse 61    Ht 5\' 6"  (1.676 m)    Wt 190 lb 9.6 oz (86.5 kg)    SpO2 96%    BMI 30.76 kg/m  GENERAL:  Well appearing elderly WM in NAD HEENT:  PERRL, EOMI, sclera are clear. Oropharynx is clear. Laceration on back of scalp with staples in place.  NECK:  No jugular venous distention,soft bilateral bruits, no thyromegaly or adenopathy LUNGS:  Clear to auscultation bilaterally  CHEST:  Unremarkable HEART:  RRR,  PMI not displaced or sustained,S1 and S2 within normal limits, no S3, no S4: no clicks, no rubs, soft systolic murmur RUSB ABD:  Soft, nontender. BS +, no masses or bruits. No hepatomegaly, no splenomegaly EXT:  2 + pulses throughout,  2+ pretibial edema, no cyanosis no clubbing SKIN:  Warm and dry.  No rashes NEURO:  Alert and oriented x 3. Cranial nerves II through XII intact. PSYCH:  Cognitively intact  LABORATORY DATA: Lab Results  Component Value Date   WBC 6.1 06/11/2021   HGB 11.0 (L) 06/11/2021   HCT 34.7 (L) 06/11/2021   PLT 164 06/11/2021   GLUCOSE 124 (H) 06/11/2021   CHOL 150 04/09/2021   TRIG 170 (H) 04/09/2021   HDL 39 (L) 04/09/2021   LDLCALC 77 04/09/2021   ALT 16 04/09/2021   AST 18 04/09/2021   NA 142 06/11/2021   K 4.3 06/11/2021   CL 106 06/11/2021   CREATININE 1.70 (H) 06/11/2021   BUN 29 (H) 06/11/2021   CO2 27 06/11/2021   TSH 5.903 (H) 04/09/2021   INR 0.9 04/08/2021   HGBA1C 6.1 (H) 07/02/2019     Assessment / Plan: 1. Coronary disease status post redo CABG with aortic valve replacement with a pericardial tissue valve in 2008. Myoview study in May of 2013 was normal. He has stable class 1-2 angina.  Continue current therapy  2. Hyperlipidemia. On lipitor 40 mg daily.   Encourage heart healthy eating.  3. Hypertension. Blood pressure control has improved. Continue salt restriction. Typically BP at home [id number] systolic. I think this is adequate. Would like to avoid orthostasis/low BP  4. Status post tissue aortic valve replacement. Stable by exam.   5.  Carotid arterial disease. Mild and asymptomatic.  6. Edema. Continue current lasix dose

## 2021-06-17 ENCOUNTER — Ambulatory Visit: Payer: PPO | Admitting: Cardiology

## 2021-06-17 ENCOUNTER — Encounter: Payer: Self-pay | Admitting: Cardiology

## 2021-06-17 ENCOUNTER — Other Ambulatory Visit: Payer: Self-pay

## 2021-06-17 VITALS — BP 166/62 | HR 61 | Ht 66.0 in | Wt 190.6 lb

## 2021-06-17 DIAGNOSIS — I6523 Occlusion and stenosis of bilateral carotid arteries: Secondary | ICD-10-CM

## 2021-06-17 DIAGNOSIS — R600 Localized edema: Secondary | ICD-10-CM

## 2021-06-17 DIAGNOSIS — Z953 Presence of xenogenic heart valve: Secondary | ICD-10-CM | POA: Diagnosis not present

## 2021-06-17 DIAGNOSIS — I25119 Atherosclerotic heart disease of native coronary artery with unspecified angina pectoris: Secondary | ICD-10-CM

## 2021-06-17 DIAGNOSIS — I10 Essential (primary) hypertension: Secondary | ICD-10-CM

## 2021-06-18 ENCOUNTER — Other Ambulatory Visit: Payer: Self-pay

## 2021-06-21 ENCOUNTER — Encounter: Payer: Self-pay | Admitting: Family Medicine

## 2021-06-21 ENCOUNTER — Other Ambulatory Visit: Payer: Self-pay

## 2021-06-21 ENCOUNTER — Ambulatory Visit (INDEPENDENT_AMBULATORY_CARE_PROVIDER_SITE_OTHER): Payer: PPO | Admitting: Family Medicine

## 2021-06-21 VITALS — BP 146/62 | HR 60 | Temp 97.4°F | Ht 66.0 in | Wt 191.8 lb

## 2021-06-21 DIAGNOSIS — S0101XD Laceration without foreign body of scalp, subsequent encounter: Secondary | ICD-10-CM | POA: Diagnosis not present

## 2021-06-21 DIAGNOSIS — N1831 Chronic kidney disease, stage 3a: Secondary | ICD-10-CM

## 2021-06-21 DIAGNOSIS — Z4802 Encounter for removal of sutures: Secondary | ICD-10-CM

## 2021-06-21 DIAGNOSIS — R2681 Unsteadiness on feet: Secondary | ICD-10-CM

## 2021-06-21 NOTE — Addendum Note (Signed)
Addended by: Loyola Mast on: 06/21/2021 02:31 PM   Modules accepted: Orders

## 2021-06-21 NOTE — Progress Notes (Addendum)
[facility] [address] [place] [address] Dept: [phone] Dept Fax: [phone]  Office Visit  Subjective:    Patient ID: [name], male    DOB: 1924/11/23, 88 y.o..   MRN: [id number]  Chief Complaint  Patient presents with   Follow-up    F/u to removed staples from back of head.  Need RX for a walker with seat on it.      History of Present Illness:  Patient is in today for assessment of a scalp laceration. [name] fell at home on 06/11/2021 and struck his head against the sheet rock. He had a laceration to his scalp. He was initially seen at Urgent Care and then sent to the ED. He had a head and cervical Spine CT. The wound was cleaned and closed with staples. He notes this has been healing well and he is otherwise doing well. The ED did not a small decrease in his renal function and asked that this be rechecked.  Since his last visit, [name] did see the cardiologist. Dr. Martinique made no changes in his meds.  Past Medical History: Patient Active Problem List   Diagnosis Date Noted   Pedal edema 04/16/2021   CKD (chronic kidney disease), stage III ([place]) 04/09/2021   Hypertensive urgency 04/08/2021   Glaucoma 11/30/2020   Carotid artery disease ([place]) 11/12/2013   Claudication ([place]) 12/12/2012   Peripheral vascular disease ([place]) 12/12/2012   Carotid bruit 08/23/2011   Atrial fibrillation with RVR ([place]) 01/12/2011   Essential hypertension    Hyperlipidemia    Coronary artery disease    Status post aortic valve replacement with tissue    Past Surgical History:  Procedure Laterality Date   APPENDECTOMY     CARDIAC CATHETERIZATION  04/02/2007   EF 60%/severe two-vessel obstructive coronary artery disease/patent saphenous bein graft to rt coronary/ severe stenosis at the ostium of the lt internal mammary artery graft to the abtuse marginal branch/normal lt ventricular function/ severe aortic  stenosis/normal rt heart pressures   CARDIAC CATHETERIZATION  07/25/2002   EF 65%/two-vessel obstructive atherosclerotic coronary artery disease/ patent saphenous vein graft to the distal rt coronary arter//patent lt internal mammary artery graft to the obtuse marginal branch/normal lt ventricular function   CARDIAC CATHETERIZATION  05/17/1991   EF65%/two-vessel ovstructive atherosclerotic coronary artery disease/good lt ventricular performance/compared with previous catheterization there is now total occlusion of the tr coronary artery at the prior angioplasty site/good collateral flow to the distal rt coronary artery is seen/there is also progressive disease in the lt circumflex vessel now to obstructive levels   CARDIAC CATHETERIZATION  07/26/1990   EF60%/borderline obstructive coronary artery disease in the mid lt circumflex & in the rt coronary artery prior angioplasty site/normal lt ventricular function   CARDIAC CATHETERIZATION  05/23/1990   successful percutaneous transluminal coronary angioplasty of the proximal rt coronary artery   CATARACT EXTRACTION W/ INTRAOCULAR LENS IMPLANT Bilateral    CORONARY ANGIOPLASTY  07/12/1990   single vessel obstructive atherosclerotic coronary atrery disease in the rt corornary artery with restenosis of the primary angioplasty site/successful repeat PTCA of the proximal rt coronary arter   CORONARY ARTERY BYPASS GRAFT     CORONARY ARTERY BYPASS GRAFT     x2 using a reverse saphenous vein graft to posterior descending, reseverse saphenous vein graft to lt internal mammary artery at the previous bypass to the circumflex with a no vein harvesting   GANGLION CYST EXCISION Right    Wrist  HEMORRHOID SURGERY     at age 8   INGUINAL HERNIA REPAIR  05/02/1965   PENILE PROSTHESIS IMPLANT  05/03/1987   STERNOTOMY     redo median with aortic valve replacement with a pericardial tissue valve/[employer] model 3000,41mm,serial numver [id number]    TONSILLECTOMY     at age 20   VASECTOMY     at age 83   Family History  Problem Relation Age of Onset   Cancer Father 86       intestinal   Heart attack Mother 61   Stroke Sister    Outpatient Medications Prior to Visit  Medication Sig Dispense Refill   amLODipine (NORVASC) 2.5 MG tablet TAKE 1 TABLET BY MOUTH EVERY DAY 30 tablet 1   aspirin (ASPIRIN CHILDRENS) 81 MG chewable tablet Chew 1 tablet (81 mg total) by mouth daily. 36 tablet 11   atorvastatin (LIPITOR) 40 MG tablet TAKE 1 TABLET BY MOUTH EVERY DAY 90 tablet 3   carvedilol (COREG) 6.25 MG tablet Take 1 tablet (6.25 mg total) by mouth 2 (two) times daily with a meal. 180 tablet 3   COMBIGAN 0.2-0.5 % ophthalmic solution Place 1 drop into the left eye every 12 (twelve) hours.      furosemide (LASIX) 20 MG tablet Take 2 tablets (40 mg total) by mouth daily. 180 tablet 3   hydrALAZINE (APRESOLINE) 50 MG tablet Take 1 tablet (50 mg total) by mouth 3 (three) times daily. 270 tablet 3   nitroGLYCERIN (NITROSTAT) 0.4 MG SL tablet Place 1 tablet (0.4 mg total) under the tongue every 5 (five) minutes as needed. 25 tablet 1   TRAVATAN Z 0.004 % SOLN ophthalmic solution Place 1 drop into the left eye at bedtime.      No facility-administered medications prior to visit.   Allergies  Allergen Reactions   Codeine     Hallucinations   Penicillins Hives     Objective:   Today's Vitals   06/21/21 1348  BP: (!) 146/62  Pulse: 60  Temp: (!) 97.4 F (36.3 C)  TempSrc: Temporal  SpO2: 97%  Weight: 191 lb 12.8 oz (87 kg)  Height: 5\' 6"  (1.676 m)   Body mass index is 30.96 kg/m.   General: Well developed, well nourished. No acute distress. Skin: There is a 4 cm well-healed laceration to the scalp. After verbal consent, staples were removed without complications.   Patient tolerated well. Psych: Alert and oriented. Normal mood and affect.  Health Maintenance Due  Topic Date Due   Zoster Vaccines- Shingrix (1 of 2) Never done    Pneumonia Vaccine 37+ Years old (2 - PPSV23 if available, else PCV20) 02/13/2019   COVID-19 Vaccine (4 - Booster for [place] series) 03/29/2021   Imaging: CT of Head CT of Cervical Spine (06/11/2021) IMPRESSION: 1. No acute intracranial abnormality. 2. No acute fracture or static subluxation of the cervical spine. 3. Multilevel degenerative changes of the cervical spine.  Lab Results BMP Latest Ref Rng & Units 06/11/2021 04/12/2021 04/10/2021  Glucose 70 - 99 mg/dL 124(H) 96 115(H)  BUN 8 - 23 mg/dL 29(H) 24(H) 20  Creatinine 0.61 - 1.24 mg/dL 1.70(H) 1.54(H) 1.42(H)  BUN/Creat Ratio 10 - 24 - - -  Sodium 135 - 145 mmol/L 142 138 138  Potassium 3.5 - 5.1 mmol/L 4.3 4.2 4.2  Chloride 98 - 111 mmol/L 106 104 105  CO2 22 - 32 mmol/L 27 25 26   Calcium 8.9 - 10.3 mg/dL 8.6(L) 8.8(L) 8.7(L)  Assessment & Plan:   1. Laceration of scalp without foreign body, subsequent encounter Staples removed. Routine wound care discussed.  2. Stage 3a chronic kidney disease ([place]) I will recheck renal function today. Plan follow-up as previously scheduled.  - Basic metabolic panel  3. Unsteady gait [name] has had frequent falls at home. Discussed him getting a walker to help with gait stability.  - For home use only DME 4 wheeled rolling walker with seat [id number])  Haydee Salter, MD

## 2021-06-22 LAB — BASIC METABOLIC PANEL
BUN: 10 mg/dL (ref 6–23)
CO2: 30 mEq/L (ref 19–32)
Calcium: 9.8 mg/dL (ref 8.4–10.5)
Chloride: 103 mEq/L (ref 96–112)
Creatinine, Ser: 0.89 mg/dL (ref 0.40–1.50)
GFR: 73.07 mL/min (ref >60.00)
Glucose, Bld: 226 mg/dL — ABNORMAL HIGH (ref 70–99)
Potassium: 4.8 mEq/L (ref 3.5–5.1)
Sodium: 140 mEq/L (ref 135–145)

## 2021-06-23 ENCOUNTER — Other Ambulatory Visit: Payer: Self-pay | Admitting: Cardiology

## 2021-06-25 ENCOUNTER — Encounter: Payer: Self-pay | Admitting: Family Medicine

## 2021-07-12 ENCOUNTER — Ambulatory Visit: Payer: PPO | Admitting: Family Medicine

## 2021-07-29 ENCOUNTER — Encounter (HOSPITAL_COMMUNITY): Payer: Self-pay | Admitting: *Deleted

## 2021-07-29 ENCOUNTER — Emergency Department (HOSPITAL_COMMUNITY): Payer: PPO

## 2021-07-29 ENCOUNTER — Ambulatory Visit (INDEPENDENT_AMBULATORY_CARE_PROVIDER_SITE_OTHER): Payer: PPO | Admitting: Family Medicine

## 2021-07-29 ENCOUNTER — Other Ambulatory Visit: Payer: Self-pay

## 2021-07-29 ENCOUNTER — Encounter: Payer: Self-pay | Admitting: Family Medicine

## 2021-07-29 ENCOUNTER — Inpatient Hospital Stay (HOSPITAL_COMMUNITY)
Admission: EM | Admit: 2021-07-29 | Discharge: 2021-08-02 | DRG: 291 | Disposition: A | Discharge status: Home Health | Payer: PPO | Source: Ambulatory Visit | Attending: Internal Medicine | Admitting: Internal Medicine

## 2021-07-29 VITALS — BP 182/78 | HR 76 | Temp 98.7°F | Ht 66.0 in | Wt 190.0 lb

## 2021-07-29 DIAGNOSIS — N179 Acute kidney failure, unspecified: Secondary | ICD-10-CM | POA: Diagnosis not present

## 2021-07-29 DIAGNOSIS — I5033 Acute on chronic diastolic (congestive) heart failure: Secondary | ICD-10-CM | POA: Insufficient documentation

## 2021-07-29 DIAGNOSIS — Z951 Presence of aortocoronary bypass graft: Secondary | ICD-10-CM

## 2021-07-29 DIAGNOSIS — Z66 Do not resuscitate: Secondary | ICD-10-CM | POA: Diagnosis present

## 2021-07-29 DIAGNOSIS — Z823 Family history of stroke: Secondary | ICD-10-CM

## 2021-07-29 DIAGNOSIS — R7989 Other specified abnormal findings of blood chemistry: Secondary | ICD-10-CM

## 2021-07-29 DIAGNOSIS — E860 Dehydration: Secondary | ICD-10-CM | POA: Diagnosis not present

## 2021-07-29 DIAGNOSIS — I248 Other forms of acute ischemic heart disease: Secondary | ICD-10-CM | POA: Diagnosis present

## 2021-07-29 DIAGNOSIS — Z7982 Long term (current) use of aspirin: Secondary | ICD-10-CM

## 2021-07-29 DIAGNOSIS — J9601 Acute respiratory failure with hypoxia: Secondary | ICD-10-CM

## 2021-07-29 DIAGNOSIS — Z880 Allergy status to penicillin: Secondary | ICD-10-CM

## 2021-07-29 DIAGNOSIS — Z800 Family history of malignant neoplasm of digestive organs: Secondary | ICD-10-CM

## 2021-07-29 DIAGNOSIS — Z20822 Contact with and (suspected) exposure to covid-19: Secondary | ICD-10-CM | POA: Diagnosis present

## 2021-07-29 DIAGNOSIS — I160 Hypertensive urgency: Secondary | ICD-10-CM | POA: Diagnosis not present

## 2021-07-29 DIAGNOSIS — I10 Essential (primary) hypertension: Secondary | ICD-10-CM | POA: Diagnosis present

## 2021-07-29 DIAGNOSIS — R778 Other specified abnormalities of plasma proteins: Principal | ICD-10-CM

## 2021-07-29 DIAGNOSIS — I6523 Occlusion and stenosis of bilateral carotid arteries: Secondary | ICD-10-CM | POA: Diagnosis present

## 2021-07-29 DIAGNOSIS — R531 Weakness: Secondary | ICD-10-CM

## 2021-07-29 DIAGNOSIS — I130 Hypertensive heart and chronic kidney disease with heart failure and stage 1 through stage 4 chronic kidney disease, or unspecified chronic kidney disease: Principal | ICD-10-CM | POA: Diagnosis present

## 2021-07-29 DIAGNOSIS — I2489 Other forms of acute ischemic heart disease: Secondary | ICD-10-CM | POA: Insufficient documentation

## 2021-07-29 DIAGNOSIS — I5031 Acute diastolic (congestive) heart failure: Secondary | ICD-10-CM | POA: Diagnosis present

## 2021-07-29 DIAGNOSIS — Z79899 Other long term (current) drug therapy: Secondary | ICD-10-CM

## 2021-07-29 DIAGNOSIS — R0602 Shortness of breath: Secondary | ICD-10-CM | POA: Diagnosis not present

## 2021-07-29 DIAGNOSIS — Z8711 Personal history of peptic ulcer disease: Secondary | ICD-10-CM

## 2021-07-29 DIAGNOSIS — Z885 Allergy status to narcotic agent status: Secondary | ICD-10-CM

## 2021-07-29 DIAGNOSIS — Z953 Presence of xenogenic heart valve: Secondary | ICD-10-CM

## 2021-07-29 DIAGNOSIS — Z9861 Coronary angioplasty status: Secondary | ICD-10-CM

## 2021-07-29 DIAGNOSIS — Z87891 Personal history of nicotine dependence: Secondary | ICD-10-CM

## 2021-07-29 DIAGNOSIS — Z8661 Personal history of infections of the central nervous system: Secondary | ICD-10-CM

## 2021-07-29 DIAGNOSIS — D649 Anemia, unspecified: Secondary | ICD-10-CM

## 2021-07-29 DIAGNOSIS — Z8249 Family history of ischemic heart disease and other diseases of the circulatory system: Secondary | ICD-10-CM

## 2021-07-29 DIAGNOSIS — I2510 Atherosclerotic heart disease of native coronary artery without angina pectoris: Secondary | ICD-10-CM | POA: Diagnosis present

## 2021-07-29 DIAGNOSIS — E785 Hyperlipidemia, unspecified: Secondary | ICD-10-CM | POA: Diagnosis present

## 2021-07-29 DIAGNOSIS — J9811 Atelectasis: Secondary | ICD-10-CM | POA: Diagnosis present

## 2021-07-29 DIAGNOSIS — I509 Heart failure, unspecified: Secondary | ICD-10-CM

## 2021-07-29 DIAGNOSIS — I472 Ventricular tachycardia, unspecified: Secondary | ICD-10-CM | POA: Diagnosis not present

## 2021-07-29 DIAGNOSIS — I161 Hypertensive emergency: Secondary | ICD-10-CM | POA: Diagnosis present

## 2021-07-29 DIAGNOSIS — N183 Chronic kidney disease, stage 3 unspecified: Secondary | ICD-10-CM | POA: Diagnosis present

## 2021-07-29 DIAGNOSIS — I480 Paroxysmal atrial fibrillation: Secondary | ICD-10-CM | POA: Diagnosis present

## 2021-07-29 DIAGNOSIS — N1832 Chronic kidney disease, stage 3b: Secondary | ICD-10-CM | POA: Diagnosis present

## 2021-07-29 LAB — MAGNESIUM: Magnesium: 2.2 mg/dL (ref 1.7–2.4)

## 2021-07-29 LAB — CBC WITH DIFFERENTIAL/PLATELET
Abs Immature Granulocytes: 0.03 10*3/uL (ref 0.00–0.07)
Basophils Absolute: 0 10*3/uL (ref 0.0–0.1)
Basophils Relative: 0 %
Eosinophils Absolute: 0 10*3/uL (ref 0.0–0.5)
Eosinophils Relative: 1 %
HCT: 35.9 % — ABNORMAL LOW (ref 39.0–52.0)
Hemoglobin: 11 g/dL — ABNORMAL LOW (ref 13.0–17.0)
Immature Granulocytes: 0 %
Lymphocytes Relative: 19 %
Lymphs Abs: 1.5 10*3/uL (ref 0.7–4.0)
MCH: 28.8 pg (ref 26.0–34.0)
MCHC: 30.6 g/dL (ref 30.0–36.0)
MCV: 94 fL (ref 80.0–100.0)
Monocytes Absolute: 1 10*3/uL (ref 0.1–1.0)
Monocytes Relative: 13 %
Neutro Abs: 5.6 10*3/uL (ref 1.7–7.7)
Neutrophils Relative %: 67 %
Platelets: 165 10*3/uL (ref 150–400)
RBC: 3.82 MIL/uL — ABNORMAL LOW (ref 4.22–5.81)
RDW: 14.7 % (ref 11.5–15.5)
WBC: 8.2 10*3/uL (ref 4.0–10.5)
nRBC: 0 % (ref 0.0–0.2)

## 2021-07-29 LAB — RESP PANEL BY RT-PCR (FLU A&B, COVID) ARPGX2
Influenza A by PCR: NEGATIVE
Influenza B by PCR: NEGATIVE
SARS Coronavirus 2 by RT PCR: NEGATIVE

## 2021-07-29 LAB — BRAIN NATRIURETIC PEPTIDE: B Natriuretic Peptide: 1087.8 pg/mL — ABNORMAL HIGH (ref 0.0–100.0)

## 2021-07-29 LAB — COMPREHENSIVE METABOLIC PANEL
ALT: 12 U/L (ref 0–44)
AST: 20 U/L (ref 15–41)
Albumin: 2.8 g/dL — ABNORMAL LOW (ref 3.5–5.0)
Alkaline Phosphatase: 67 U/L (ref 38–126)
Anion gap: 5 (ref 5–15)
BUN: 29 mg/dL — ABNORMAL HIGH (ref 8–23)
CO2: 26 mmol/L (ref 22–32)
Calcium: 8.5 mg/dL — ABNORMAL LOW (ref 8.9–10.3)
Chloride: 110 mmol/L (ref 98–111)
Creatinine, Ser: 1.59 mg/dL — ABNORMAL HIGH (ref 0.61–1.24)
GFR, Estimated: 40 mL/min — ABNORMAL LOW (ref >60)
Glucose, Bld: 117 mg/dL — ABNORMAL HIGH (ref 70–99)
Potassium: 4.2 mmol/L (ref 3.5–5.1)
Sodium: 141 mmol/L (ref 135–145)
Total Bilirubin: 1.2 mg/dL (ref 0.3–1.2)
Total Protein: 5.9 g/dL — ABNORMAL LOW (ref 6.5–8.1)

## 2021-07-29 LAB — TSH: TSH: 3.515 u[IU]/mL (ref 0.350–4.500)

## 2021-07-29 LAB — TROPONIN I (HIGH SENSITIVITY)
Troponin I (High Sensitivity): 152 ng/L (ref <18)
Troponin I (High Sensitivity): 157 ng/L (ref <18)
Troponin I (High Sensitivity): 134 ng/L (ref <18)
Troponin I (High Sensitivity): 148 ng/L (ref <18)

## 2021-07-29 MED ORDER — CARVEDILOL 6.25 MG PO TABS
6.2500 mg | ORAL_TABLET | Freq: Two times a day (BID) | ORAL | Status: DC
  Administered 2021-07-29 – 2021-08-02 (×8): 6.25 mg via ORAL
  Filled 2021-07-29: qty 1
  Filled 2021-07-29: qty 2
  Filled 2021-07-29 (×6): qty 1

## 2021-07-29 MED ORDER — FUROSEMIDE 10 MG/ML IJ SOLN
20.0000 mg | Freq: Once | INTRAMUSCULAR | Status: AC
  Administered 2021-07-29: 20 mg via INTRAVENOUS
  Filled 2021-07-29: qty 2

## 2021-07-29 MED ORDER — ASPIRIN 81 MG PO CHEW
81.0000 mg | CHEWABLE_TABLET | Freq: Every day | ORAL | Status: DC
  Administered 2021-07-29 – 2021-08-02 (×5): 81 mg via ORAL
  Filled 2021-07-29 (×5): qty 1

## 2021-07-29 MED ORDER — SODIUM CHLORIDE 0.9% FLUSH: 3.0000 mL | INTRAVENOUS | Status: DC | PRN

## 2021-07-29 MED ORDER — AMLODIPINE BESYLATE 5 MG PO TABS: 2.5000 mg | ORAL_TABLET | Freq: Every day | ORAL | Status: DC

## 2021-07-29 MED ORDER — ENOXAPARIN SODIUM 30 MG/0.3ML IJ SOSY
30.0000 mg | PREFILLED_SYRINGE | INTRAMUSCULAR | Status: DC
  Administered 2021-07-29 – 2021-08-01 (×4): 30 mg via SUBCUTANEOUS
  Filled 2021-07-29 (×4): qty 0.3

## 2021-07-29 MED ORDER — LATANOPROST 0.005 % OP SOLN
1.0000 [drp] | Freq: Every day | OPHTHALMIC | Status: DC
Start: 2021-07-29 — End: 2021-08-02
  Administered 2021-07-29 – 2021-08-01 (×4): 1 [drp] via OPHTHALMIC
  Filled 2021-07-29: qty 2.5

## 2021-07-29 MED ORDER — ACETAMINOPHEN 650 MG RE SUPP: 650.0000 mg | Freq: Four times a day (QID) | RECTAL | Status: DC | PRN

## 2021-07-29 MED ORDER — FUROSEMIDE 10 MG/ML IJ SOLN
40.0000 mg | Freq: Two times a day (BID) | INTRAMUSCULAR | Status: DC
  Administered 2021-07-30 – 2021-08-01 (×5): 40 mg via INTRAVENOUS
  Filled 2021-07-29 (×5): qty 4

## 2021-07-29 MED ORDER — ALBUTEROL SULFATE (2.5 MG/3ML) 0.083% IN NEBU: 2.5000 mg | INHALATION_SOLUTION | RESPIRATORY_TRACT | Status: DC | PRN

## 2021-07-29 MED ORDER — HYDRALAZINE HCL 50 MG PO TABS
50.0000 mg | ORAL_TABLET | Freq: Three times a day (TID) | ORAL | Status: DC
  Administered 2021-07-29 – 2021-08-02 (×12): 50 mg via ORAL
  Filled 2021-07-29: qty 2
  Filled 2021-07-29 (×11): qty 1

## 2021-07-29 MED ORDER — SODIUM CHLORIDE 0.9% FLUSH
3.0000 mL | Freq: Two times a day (BID) | INTRAVENOUS | Status: DC
  Administered 2021-07-29 – 2021-08-02 (×8): 3 mL via INTRAVENOUS

## 2021-07-29 MED ORDER — FUROSEMIDE 10 MG/ML IJ SOLN
60.0000 mg | Freq: Once | INTRAMUSCULAR | Status: AC
  Administered 2021-07-29: 60 mg via INTRAVENOUS
  Filled 2021-07-29: qty 6

## 2021-07-29 MED ORDER — BRIMONIDINE TARTRATE-TIMOLOL 0.2-0.5 % OP SOLN
1.0000 [drp] | Freq: Two times a day (BID) | OPHTHALMIC | Status: DC
  Filled 2021-07-29: qty 5

## 2021-07-29 MED ORDER — BRIMONIDINE TARTRATE 0.2 % OP SOLN
1.0000 [drp] | Freq: Two times a day (BID) | OPHTHALMIC | Status: DC
  Administered 2021-07-29 – 2021-08-02 (×8): 1 [drp] via OPHTHALMIC
  Filled 2021-07-29: qty 5

## 2021-07-29 MED ORDER — ATORVASTATIN CALCIUM 40 MG PO TABS
40.0000 mg | ORAL_TABLET | Freq: Every day | ORAL | Status: DC
  Administered 2021-07-29 – 2021-08-02 (×5): 40 mg via ORAL
  Filled 2021-07-29 (×5): qty 1

## 2021-07-29 MED ORDER — TIMOLOL MALEATE 0.5 % OP SOLN
1.0000 [drp] | Freq: Two times a day (BID) | OPHTHALMIC | Status: DC
  Administered 2021-07-29 – 2021-08-02 (×8): 1 [drp] via OPHTHALMIC
  Filled 2021-07-29: qty 5

## 2021-07-29 MED ORDER — HYDRALAZINE HCL 20 MG/ML IJ SOLN
10.0000 mg | Freq: Once | INTRAMUSCULAR | Status: AC
  Administered 2021-07-29: 10 mg via INTRAVENOUS
  Filled 2021-07-29: qty 1

## 2021-07-29 MED ORDER — ACETAMINOPHEN 325 MG PO TABS: 650.0000 mg | ORAL_TABLET | Freq: Four times a day (QID) | ORAL | Status: DC | PRN

## 2021-07-29 MED ORDER — SODIUM CHLORIDE 0.9 % IV SOLN: 250.0000 mL | INTRAVENOUS | Status: DC | PRN

## 2021-07-29 NOTE — Consult Note (Addendum)
?Cardiology Consultation:  ? ?Patient ID: [name] ?MRN: [id number]; DOB: [birth year] ? ?Admit date: 07/29/2021 ?Date of Consult: 07/29/2021 ? ?PCP:  Haydee Salter, MD ?  ?[facility] Providers ?Cardiologist:  Peter Martinique, MD   ? ? ?Patient Profile:  ? ?[name] is a 88 y.o. male with a hx of CAD s/p redo CABG, AS s/p AVR in 2008, mild bilateral ICA stenosis, HTN, HLD, PAF, and PVD who is being seen 07/29/2021 for the evaluation of CHF at the request of Dr. Rogers Blocker. ? ?History of Present Illness:  ? ?[name] is a 88 year old male with above medical history who is followed by Dr. Martinique. Per chart review, patient has a history of CAD and is s/p redo CABG and AVR in 2008. Patient was diagnosed with PAF in September of 2012, however he converted spontaneously. Echocardiogram on 01/13/2011 showed LVEF 55-60%, moderate LVH, no wall motion abnormalities. Patient later had a lexiscan myoview in 2013 that was a normal study, EF normal. LE arterial Dopplers in August 2016 showed mild vascular disease and good ABIs, Patient also has bilateral ICA stenosis by carotid artery Doppler in August 2017.  ? ?Patient was recently hospitalized in December 2022 for evaluation of hypertensive urgency. Echocardiogram on 04/09/21 showed EFJ 50-55%, no regional wall motion abnormalities, mild LVH, Grade I diastolic dysfunction, severely dilated LA, moderately dilated RA, aortic prosthetic valve was well seated.  ? ?Patient was last seen by cardiology on 06/17/21. At that appointment, patient reported that he has occasional falls, but denied syncope. Becomes SOB easily. Denied chest pain.  ? ?Patient presented to his PCP on 3/30 complaining of weakness, fatigue, SOB, pain in legs/feet, and slight fever. Patient reported seeing his deceased wife, and thinking it was time to die. Given patient had acute illness, was hypertensive, and had O2 sats of 90% on room air patient was transported to [facility] ED.  ? ?Labs in the ED  showed Na 141, K 4.2, creatinine 1.59, eGFR 40, calcium 8.5, WBC 8.2, hemoglobin 11.0, platelets 165. hsTn 152>>157. BNP elevated to 1087.8. Chest xray showed mild bibasial subsegmental atelectasis or edema. EKG showed sinus rhythm, LVH.  ? ?On interview, patient reports that he has noticed increased SOB on exertion and increased weakness for the past 3 days. Noticed increased swelling in his legs. Has a cough that produces a cloudy/white sputum. Denies any chest pain. Denies orthopnea. Denies nausea/vomiting. Does not smoke or drink. Denies any palpitations.  ? ?Past Medical History:  ?Diagnosis Date  ? Carotid arterial disease ([place])   ? Doppler in 2003 with 40 to 60% L ICA  ? Carpal tunnel syndrome   ? Coronary artery disease   ? s/p CABG & redo CABG with AVR  ? Hyperlipidemia   ? Hypertension   ? Meningitis   ? Normal echocardiogram Sept 2012  ? Has normal EF, mild AS and moderate LAE  ? PAF (paroxysmal atrial fibrillation) [facility]) Sept 2012  ? converted spontaneously  ? PUD (peptic ulcer disease)   ? Remote history  ? Status post aortic valve replacement with tissue   ? for aortic stenosis  ? ? ?Past Surgical History:  ?Procedure Laterality Date  ? APPENDECTOMY    ? CARDIAC CATHETERIZATION  04/02/2007  ? EF 60%/severe two-vessel obstructive coronary artery disease/patent saphenous bein graft to rt coronary/ severe stenosis at the ostium of the lt internal mammary artery graft to the abtuse marginal branch/normal lt ventricular function/ severe aortic stenosis/normal rt heart  pressures  ? CARDIAC CATHETERIZATION  07/25/2002  ? EF 65%/two-vessel obstructive atherosclerotic coronary artery disease/ patent saphenous vein graft to the distal rt coronary arter//patent lt internal mammary artery graft to the obtuse marginal branch/normal lt ventricular function  ? CARDIAC CATHETERIZATION  05/17/1991  ? EF65%/two-vessel ovstructive atherosclerotic coronary artery disease/good lt ventricular performance/compared with  previous catheterization there is now total occlusion of the tr coronary artery at the prior angioplasty site/good collateral flow to the distal rt coronary artery is seen/there is also progressive disease in the lt circumflex vessel now to obstructive levels  ? CARDIAC CATHETERIZATION  07/26/1990  ? EF60%/borderline obstructive coronary artery disease in the mid lt circumflex & in the rt coronary artery prior angioplasty site/normal lt ventricular function  ? CARDIAC CATHETERIZATION  05/23/1990  ? successful percutaneous transluminal coronary angioplasty of the proximal rt coronary artery  ? CATARACT EXTRACTION W/ INTRAOCULAR LENS IMPLANT Bilateral   ? CORONARY ANGIOPLASTY  07/12/1990  ? single vessel obstructive atherosclerotic coronary atrery disease in the rt corornary artery with restenosis of the primary angioplasty site/successful repeat PTCA of the proximal rt coronary arter  ? CORONARY ARTERY BYPASS GRAFT    ? CORONARY ARTERY BYPASS GRAFT    ? x2 using a reverse saphenous vein graft to posterior descending, reseverse saphenous vein graft to lt internal mammary artery at the previous bypass to the circumflex with a no vein harvesting  ? GANGLION CYST EXCISION Right   ? Wrist  ? HEMORRHOID SURGERY    ? at age 73  ? INGUINAL HERNIA REPAIR  05/02/1965  ? PENILE PROSTHESIS IMPLANT  05/03/1987  ? STERNOTOMY    ? redo median with aortic valve replacement with a pericardial tissue valve/[employer] model 3000,50mm,serial numver [id number]  ? TONSILLECTOMY    ? at age 24  ? VASECTOMY    ? at age 80  ?  ? ?Home Medications:  ?Prior to Admission medications   ?Medication Sig Start Date End Date Taking? Authorizing Provider  ?amLODipine (NORVASC) 2.5 MG tablet TAKE 1 TABLET BY MOUTH EVERY DAY 06/23/21  Yes Martinique, Peter M, MD  ?aspirin (ASPIRIN CHILDRENS) 81 MG chewable tablet Chew 1 tablet (81 mg total) by mouth daily. 01/19/11  Yes Burtis Junes, NP  ?atorvastatin (LIPITOR) 40 MG tablet TAKE 1 TABLET BY MOUTH  EVERY DAY 05/05/21  Yes Martinique, Peter M, MD  ?carvedilol (COREG) 6.25 MG tablet Take 1 tablet (6.25 mg total) by mouth 2 (two) times daily with a meal. 05/11/21  Yes Haydee Salter, MD  ?COMBIGAN 0.2-0.5 % ophthalmic solution Place 1 drop into the left eye every 12 (twelve) hours.  12/06/12  Yes [provider]  ?furosemide (LASIX) 20 MG tablet Take 2 tablets (40 mg total) by mouth daily. 05/17/21 05/12/22 Yes RuddLillette Boxer, MD  ?hydrALAZINE (APRESOLINE) 50 MG tablet Take 1 tablet (50 mg total) by mouth 3 (three) times daily. 05/05/21 08/03/21 Yes Martinique, Peter M, MD  ?ibuprofen (ADVIL) 200 MG tablet Take 200 mg by mouth every 6 (six) hours as needed for moderate pain.   Yes [provider]  ?nitroGLYCERIN (NITROSTAT) 0.4 MG SL tablet Place 1 tablet (0.4 mg total) under the tongue every 5 (five) minutes as needed. 07/24/20  Yes Martinique, Peter M, MD  ?TRAVATAN Z 0.004 % SOLN ophthalmic solution Place 1 drop into the left eye at bedtime.  12/06/12  Yes [provider]  ? ? ?Inpatient Medications: ?Scheduled Meds: ? ?Continuous Infusions: ? ?PRN Meds: ? ? ?  Allergies:    ?Allergies  ?Allergen Reactions  ? Codeine   ?  Hallucinations  ? Penicillins Hives  ? ? ?Social History:   ?Social History  ? ?Socioeconomic History  ? Marital status: Widowed  ?  Spouse name: Not on file  ? Number of children: 3  ? Years of education: Not on file  ? Highest education level: Not on file  ?Occupational History  ? Occupation: city of [employer]   ?  Employer: RETIRED  ?  Comment: retired  ? Occupation: security guard  ?Tobacco Use  ? Smoking status: Former  ?  Packs/day: 1.50  ?  Years: 41.00  ?  Pack years: 61.50  ?  Types: Cigarettes, Cigars  ?  Quit date: 05/02/1982  ?  Years since quitting: 39.2  ? Smokeless tobacco: Never  ?Substance and Sexual Activity  ? Alcohol use: No  ? Drug use: No  ? Sexual activity: Not on file  ?Other Topics Concern  ? Not on file  ?Social History Narrative  ? Lives alone.  Lives near daughter.    ? ?Social Determinants of Health  ? ?Financial Resource Strain: Not on file  ?Food Insecurity: Not on file  ?Transportation Needs: Not on file  ?Physical Activity: Not on file  ?Stress: Not on file  ?Social

## 2021-07-29 NOTE — Assessment & Plan Note (Addendum)
?   Shortness of breath appears to be improving.  Appears to be able to walk further distances. ?? Cardiology following ?? He was diuresed with IV lasix with improvement of volume status ?? He was transitioned back to lasix 40mg  po daily ?? Monitor intake and output/daily weights ?? Echo shows preserved EF ?? Coreg and hydralazine ?? Discharge weight is 182 lbs ? ?

## 2021-07-29 NOTE — Assessment & Plan Note (Addendum)
Has had orthostasis/low bp at home with higher doses of medication ?[id number] systolic goal ?Continue coreg/hydralazine.  ?BPs have been stable ?

## 2021-07-29 NOTE — Assessment & Plan Note (Addendum)
Baseline creatinine around 1.3-1.5 ?Mild uptrend in creatinine, likely related to lasix ?Continue to monitor with diuresis  ?Would advise to stop using NSAIDs.  ?Repeat chemistry as an outpatient ?

## 2021-07-29 NOTE — ED Notes (Signed)
Xray into room. Pt alert, NAD, calm, some increased wob and exp wheeze noted, also pedal edema.  ?

## 2021-07-29 NOTE — ED Triage Notes (Signed)
BIB [facility] for concern for PNA from [facility], c/o sob, productive cough and general weakness of 4 days, wheezing noted. SPO2 was 90% on RA at PCP, up top 96% on 2L, no meds given. No NSL. BS 127. NSR on monitor.  ?

## 2021-07-29 NOTE — Assessment & Plan Note (Addendum)
Likely multifactorial ?Seen by PT/OT with recommendations for [facility] ?Overall exercise tolerance appears to be improving ?He was able to ambulate a little over 300 feet today. ?

## 2021-07-29 NOTE — Assessment & Plan Note (Signed)
Continue lipitor  ?

## 2021-07-29 NOTE — H&P (Signed)
?History and Physical  ? ? ?Patient: [name] [id number] DOB: 1924-07-24 ?DOA: 07/29/2021 ?DOS: the patient was seen and examined on 07/29/2021 ?PCP: Haydee Salter, MD  ?Patient coming from:  PCP office  - lives alone ? ? ?Chief Complaint: shortness of breath and weakness  ? ?HPI: [name] is a 88 y.o. male with medical history significant of CAD s/p CABG and CABG revision with AVR, HTN, HLD, CKD stage III, hx of PUD, carotid artery disease who presented to ED with complaints of shortness of breath x 3 days, lower leg swelling and cough. He also states he had weakness come over him quickly a few days ago as well. He was working with his son on Monday and had a small fall onto his left leg and has been weak since that time. He states he can't balance himself and has to hold onto things. He has been short of breath and can't walk far with out having to stop and rest which has gotten progressively worse over the past few days. He has a mild productive cough that has been going on for a "long time, but worse lately." He isn't sure about weight gain and states he has chronic leg swelling, but  his feet have hurt more lately because they are more swollen. He denies any orthopnea.  ? ? ?Denies any fever/+chills, vision changes/headaches, chest pain or palpitations, abdominal pain, N/V/D, dysuria.  ? ? ?He does not smoke or drink  ? ? ?ER Course:  vitals: bp: 172/72, HR: 67, RR: 23, oxygen: 97%RA ?Pertinent labs: hgb 11 (11-12), BUN: 29, creatinine: 1.59 (1.3-1.5), troponin 152>157, BNP 1087 ?CXR: mild bibasilar subsegmental atelectasis or edema ?In ED: given 60mg  IV lasix and hydralazine. Cardiology consulted as well.  ? ? ?Review of Systems: As mentioned in the history of present illness. All other systems reviewed and are negative. ?Past Medical History:  ?Diagnosis Date  ? Carotid arterial disease ([place])   ? Doppler in 2003 with 40 to 60% L ICA  ? Carpal tunnel syndrome   ? Coronary artery disease   ?  s/p CABG & redo CABG with AVR  ? Hyperlipidemia   ? Hypertension   ? Meningitis   ? Normal echocardiogram Sept 2012  ? Has normal EF, mild AS and moderate LAE  ? PAF (paroxysmal atrial fibrillation) [facility]) Sept 2012  ? converted spontaneously  ? PUD (peptic ulcer disease)   ? Remote history  ? Status post aortic valve replacement with tissue   ? for aortic stenosis  ? ?Past Surgical History:  ?Procedure Laterality Date  ? APPENDECTOMY    ? CARDIAC CATHETERIZATION  04/02/2007  ? EF 60%/severe two-vessel obstructive coronary artery disease/patent saphenous bein graft to rt coronary/ severe stenosis at the ostium of the lt internal mammary artery graft to the abtuse marginal branch/normal lt ventricular function/ severe aortic stenosis/normal rt heart pressures  ? CARDIAC CATHETERIZATION  07/25/2002  ? EF 65%/two-vessel obstructive atherosclerotic coronary artery disease/ patent saphenous vein graft to the distal rt coronary arter//patent lt internal mammary artery graft to the obtuse marginal branch/normal lt ventricular function  ? CARDIAC CATHETERIZATION  05/17/1991  ? EF65%/two-vessel ovstructive atherosclerotic coronary artery disease/good lt ventricular performance/compared with previous catheterization there is now total occlusion of the tr coronary artery at the prior angioplasty site/good collateral flow to the distal rt coronary artery is seen/there is also progressive disease in the lt circumflex vessel now to obstructive levels  ? CARDIAC CATHETERIZATION  07/26/1990  ?  EF60%/borderline obstructive coronary artery disease in the mid lt circumflex & in the rt coronary artery prior angioplasty site/normal lt ventricular function  ? CARDIAC CATHETERIZATION  05/23/1990  ? successful percutaneous transluminal coronary angioplasty of the proximal rt coronary artery  ? CATARACT EXTRACTION W/ INTRAOCULAR LENS IMPLANT Bilateral   ? CORONARY ANGIOPLASTY  07/12/1990  ? single vessel obstructive atherosclerotic coronary  atrery disease in the rt corornary artery with restenosis of the primary angioplasty site/successful repeat PTCA of the proximal rt coronary arter  ? CORONARY ARTERY BYPASS GRAFT    ? CORONARY ARTERY BYPASS GRAFT    ? x2 using a reverse saphenous vein graft to posterior descending, reseverse saphenous vein graft to lt internal mammary artery at the previous bypass to the circumflex with a no vein harvesting  ? GANGLION CYST EXCISION Right   ? Wrist  ? HEMORRHOID SURGERY    ? at age 50  ? INGUINAL HERNIA REPAIR  05/02/1965  ? PENILE PROSTHESIS IMPLANT  05/03/1987  ? STERNOTOMY    ? redo median with aortic valve replacement with a pericardial tissue valve/[employer] model 3000,58mm,serial numver [id number]  ? TONSILLECTOMY    ? at age 58  ? VASECTOMY    ? at age 22  ? ?Social History:  reports that he quit smoking about 39 years ago. His smoking use included cigarettes and cigars. He has a 61.50 pack-year smoking history. He has never used smokeless tobacco. He reports that he does not drink alcohol and does not use drugs. ? ?Allergies  ?Allergen Reactions  ? Codeine   ?  Hallucinations  ? Penicillins Hives  ?  Did it involve swelling of the face/tongue/throat, SOB, or low BP? No ?Did it involve sudden or severe rash/hives, skin peeling, or any reaction on the inside of your mouth or nose? Yes ?Did you need to seek medical attention at a hospital or doctor's office? No ?When did it last happen?     Decades ago  ?If all above answers are "NO", may proceed with cephalosporin use. ?   ? ? ?Family History  ?Problem Relation Age of Onset  ? Cancer Father 35  ?     intestinal  ? Heart attack Mother 75  ? Stroke Sister   ? ? ?Prior to Admission medications   ?Medication Sig Start Date End Date Taking? Authorizing Provider  ?amLODipine (NORVASC) 2.5 MG tablet TAKE 1 TABLET BY MOUTH EVERY DAY 06/23/21   [place]ique, Peter M, MD  ?aspirin (ASPIRIN CHILDRENS) 81 MG chewable tablet Chew 1 tablet (81 mg total) by mouth daily.  01/19/11   Burtis Junes, NP  ?atorvastatin (LIPITOR) 40 MG tablet TAKE 1 TABLET BY MOUTH EVERY DAY 05/05/21   [place], Peter M, MD  ?carvedilol (COREG) 6.25 MG tablet Take 1 tablet (6.25 mg total) by mouth 2 (two) times daily with a meal. 05/11/21   Haydee Salter, MD  ?COMBIGAN 0.2-0.5 % ophthalmic solution Place 1 drop into the left eye every 12 (twelve) hours.  12/06/12   [provider]  ?furosemide (LASIX) 20 MG tablet Take 2 tablets (40 mg total) by mouth daily. 05/17/21 05/12/22  Haydee Salter, MD  ?hydrALAZINE (APRESOLINE) 50 MG tablet Take 1 tablet (50 mg total) by mouth 3 (three) times daily. 05/05/21 08/03/21  [place], Peter M, MD  ?nitroGLYCERIN (NITROSTAT) 0.4 MG SL tablet Place 1 tablet (0.4 mg total) under the tongue every 5 (five) minutes as needed. 07/24/20   [place], Peter M, MD  ?  TRAVATAN Z 0.004 % SOLN ophthalmic solution Place 1 drop into the left eye at bedtime.  12/06/12   [provider]  ? ? ?Physical Exam: ?Vitals:  ? 07/29/21 1430 07/29/21 1445 07/29/21 1500 07/29/21 1800  ?BP: (!) 182/74 (!) 184/76 (!) 174/92 (!) 178/97  ?Pulse: 73 71 75 75  ?Resp: (!) 26 (!) 31 (!) 24 (!) 25  ?SpO2: 95% 94% 95% 95%  ?Weight:      ? ?General:  Appears calm and comfortable and is in NAD ?Eyes:  PERRL, EOMI, normal lids, iris ?ENT:  hard of hearing, lips & tongue, mmm; appropriate dentition ?Neck:  no LAD, masses or thyromegaly; +left carotid bruit +JVD ?Cardiovascular:  RRR, +systolic murmur. 2+ bilateral pitting edema.  ?Respiratory:   bibasilar crackles. Supraglottic expiratory wheeze  Normal respiratory effort. ?Abdomen:  soft, NT, ND, NABS ?Back:   normal alignment, no CVAT ?Skin:  no rash or induration seen on limited exam ?Musculoskeletal:  grossly normal tone BUE/BLE, good ROM, no bony abnormality ?Lower extremity:   Limited foot exam with no ulcerations.  2+ distal pulses. ?Psychiatric:  grossly normal mood and affect, speech fluent and appropriate, AOx3 ?Neurologic:  CN 2-12 grossly intact,  moves all extremities in coordinated fashion, sensation intact ? ? ?Radiological Exams on Admission: ?Independently reviewed - see discussion in A/P where applicable ? ?DG Chest Portable 1 View ? ?Result

## 2021-07-29 NOTE — ED Notes (Signed)
Lab at BS 

## 2021-07-29 NOTE — Assessment & Plan Note (Addendum)
History of CABG with revision and AVR in 2009.  ?No chest pain or changes on ekg ?Continue ASA/statin/coreg  ?

## 2021-07-29 NOTE — Assessment & Plan Note (Addendum)
Status post aortic valve replacement with a #23 mm pericardial tissue valve in January of 2009. ?Echo shows stable valve function ?

## 2021-07-29 NOTE — Assessment & Plan Note (Addendum)
Likely ACD ?Baseline hgb: 11-12 ?Hemoglobin currently near baseline ?

## 2021-07-29 NOTE — Progress Notes (Signed)
?[facility] ?[facility] ?[place] ?[place] [address] ?Dept: [phone] ?Dept Fax: [phone] ? ?Office Visit ? ?Subjective:  ? ? Patient ID: [name], male    DOB: Feb 17, 1925, 88 y.o..   MRN: [id number] ? ?Chief Complaint  ?Patient presents with  ? Acute Visit  ?  C/o having dryness inhis mouth, weak/fatigue, SOB, legs/feet hurt and slight fever.   ? ? ?History of Present Illness: ? ?Patient is in today for with a 4-day history of progressive illness. Mr. [name]  notes that he feels weak. He has had some mild coughing spells. His temperature yesterday was 99.8? F. He has been not getting out of the bed and has not eaten since yesterday at lunch. He notes his mouth feels dry. He has had some dyspnea and is coughing up phlegm. he also notes bilateral foot pain. He states that last night, he saw his deceased wife and thought it was his time to die. ? ?Past Medical History: ?Patient Active Problem List  ? Diagnosis Date Noted  ? Pedal edema 04/16/2021  ? CKD (chronic kidney disease), stage III ([place]) 04/09/2021  ? Hypertensive urgency 04/08/2021  ? Glaucoma 11/30/2020  ? Carotid artery disease ([place]) 11/12/2013  ? Claudication ([place]) 12/12/2012  ? Peripheral vascular disease ([place]) 12/12/2012  ? Carotid bruit 08/23/2011  ? Atrial fibrillation with RVR ([place]) 01/12/2011  ? Essential hypertension   ? Hyperlipidemia   ? Coronary artery disease   ? Status post aortic valve replacement with tissue   ? ?Past Surgical History:  ?Procedure Laterality Date  ? APPENDECTOMY    ? CARDIAC CATHETERIZATION  04/02/2007  ? EF 60%/severe two-vessel obstructive coronary artery disease/patent saphenous bein graft to rt coronary/ severe stenosis at the ostium of the lt internal mammary artery graft to the abtuse marginal branch/normal lt ventricular function/ severe aortic stenosis/normal rt heart pressures  ? CARDIAC CATHETERIZATION  07/25/2002  ? EF 65%/two-vessel obstructive  atherosclerotic coronary artery disease/ patent saphenous vein graft to the distal rt coronary arter//patent lt internal mammary artery graft to the obtuse marginal branch/normal lt ventricular function  ? CARDIAC CATHETERIZATION  05/17/1991  ? EF65%/two-vessel ovstructive atherosclerotic coronary artery disease/good lt ventricular performance/compared with previous catheterization there is now total occlusion of the tr coronary artery at the prior angioplasty site/good collateral flow to the distal rt coronary artery is seen/there is also progressive disease in the lt circumflex vessel now to obstructive levels  ? CARDIAC CATHETERIZATION  07/26/1990  ? EF60%/borderline obstructive coronary artery disease in the mid lt circumflex & in the rt coronary artery prior angioplasty site/normal lt ventricular function  ? CARDIAC CATHETERIZATION  05/23/1990  ? successful percutaneous transluminal coronary angioplasty of the proximal rt coronary artery  ? CATARACT EXTRACTION W/ INTRAOCULAR LENS IMPLANT Bilateral   ? CORONARY ANGIOPLASTY  07/12/1990  ? single vessel obstructive atherosclerotic coronary atrery disease in the rt corornary artery with restenosis of the primary angioplasty site/successful repeat PTCA of the proximal rt coronary arter  ? CORONARY ARTERY BYPASS GRAFT    ? CORONARY ARTERY BYPASS GRAFT    ? x2 using a reverse saphenous vein graft to posterior descending, reseverse saphenous vein graft to lt internal mammary artery at the previous bypass to the circumflex with a no vein harvesting  ? GANGLION CYST EXCISION Right   ? Wrist  ? HEMORRHOID SURGERY    ? at age 64  ? INGUINAL HERNIA REPAIR  05/02/1965  ? PENILE PROSTHESIS IMPLANT  05/03/1987  ?  STERNOTOMY    ? redo median with aortic valve replacement with a pericardial tissue valve/[employer] model 3000,76mm,serial numver [id number]  ? TONSILLECTOMY    ? at age 10  ? VASECTOMY    ? at age 70  ? ?Family History  ?Problem Relation Age of Onset  ?  Cancer Father 80  ?     intestinal  ? Heart attack Mother 59  ? Stroke Sister   ? ?Outpatient Medications Prior to Visit  ?Medication Sig Dispense Refill  ? amLODipine (NORVASC) 2.5 MG tablet TAKE 1 TABLET BY MOUTH EVERY DAY 30 tablet 6  ? aspirin (ASPIRIN CHILDRENS) 81 MG chewable tablet Chew 1 tablet (81 mg total) by mouth daily. 36 tablet 11  ? atorvastatin (LIPITOR) 40 MG tablet TAKE 1 TABLET BY MOUTH EVERY DAY 90 tablet 3  ? carvedilol (COREG) 6.25 MG tablet Take 1 tablet (6.25 mg total) by mouth 2 (two) times daily with a meal. 180 tablet 3  ? COMBIGAN 0.2-0.5 % ophthalmic solution Place 1 drop into the left eye every 12 (twelve) hours.     ? furosemide (LASIX) 20 MG tablet Take 2 tablets (40 mg total) by mouth daily. 180 tablet 3  ? hydrALAZINE (APRESOLINE) 50 MG tablet Take 1 tablet (50 mg total) by mouth 3 (three) times daily. 270 tablet 3  ? nitroGLYCERIN (NITROSTAT) 0.4 MG SL tablet Place 1 tablet (0.4 mg total) under the tongue every 5 (five) minutes as needed. 25 tablet 1  ? TRAVATAN Z 0.004 % SOLN ophthalmic solution Place 1 drop into the left eye at bedtime.     ? ?No facility-administered medications prior to visit.  ? ?Allergies  ?Allergen Reactions  ? Codeine   ?  Hallucinations  ? Penicillins Hives  ?   ?Objective:  ? ?Today's Vitals  ? 07/29/21 1112 07/29/21 1114  ?BP: (!) 180/84 (!) 182/78  ?Pulse: 76   ?Temp: 98.7 ?F (37.1 ?C)   ?TempSrc: Temporal   ?SpO2: 90% 95%  ?Weight: 190 lb (86.2 kg)   ?Height: 5\' 6"  (1.676 m)   ? ?Body mass index is 30.67 kg/m?.  ? ?General: Ill-appearing with mild to moderate respiratory distress. ?Lungs: Shallow respirations with an audible wheeze. ?Extremities: No edema. ?Psych: Alert and oriented. ? ?Health Maintenance Due  ?Topic Date Due  ? Zoster Vaccines- Shingrix (1 of 2) Never done  ? Pneumonia Vaccine 61+ Years old (2 - PPSV23 if available, else PCV20) 02/13/2019  ? COVID-19 Vaccine (4 - Booster for [place] series) 03/29/2021  ?   ?Assessment & Plan:  ? ?1.  Acute respiratory failure with hypoxia ([place]) ?2. Dehydration ?3. Weakness ?Mr. [name] has an acute illness. I am concerned for possible acute respiratory illness. We did place oxygen on him and his sat improved with this. I feel he needs more urgent evaluation and potential hospital admission for management of this. We have called EMS for transport to [facility]. ? ?Return for As scheduled.  ? ?Haydee Salter, MD ?

## 2021-07-29 NOTE — ED Provider Notes (Signed)
?[facility] ?Provider Note ? ? ?CSN: [id number] ?Arrival date & time: 07/29/21  1251 ? ?  ? ?History ? ?Chief Complaint  ?Patient presents with  ? Shortness of Breath  ? ? ?[name] is a 88 y.o. male. ? ?Patient here with shortness of breath over the last several days.  Lives by himself.  Sent from primary care for concern for pneumonia.  He has had may be some cough and generalized weakness.  Denies any fevers or chills.  Has history of CAD.  History of paroxysmal A-fib but not on blood thinners.  Does take Lasix.  Maybe noticed some swelling in his legs.  Walking makes it worse.  Rest makes it better. ? ?The history is provided by the patient.  ? ?  ? ?Home Medications ?Prior to Admission medications   ?Medication Sig Start Date End Date Taking? Authorizing Provider  ?amLODipine (NORVASC) 2.5 MG tablet TAKE 1 TABLET BY MOUTH EVERY DAY 06/23/21   [place], Peter M, MD  ?aspirin (ASPIRIN CHILDRENS) 81 MG chewable tablet Chew 1 tablet (81 mg total) by mouth daily. 01/19/11   Burtis Junes, NP  ?atorvastatin (LIPITOR) 40 MG tablet TAKE 1 TABLET BY MOUTH EVERY DAY 05/05/21   [place], Peter M, MD  ?carvedilol (COREG) 6.25 MG tablet Take 1 tablet (6.25 mg total) by mouth 2 (two) times daily with a meal. 05/11/21   Haydee Salter, MD  ?COMBIGAN 0.2-0.5 % ophthalmic solution Place 1 drop into the left eye every 12 (twelve) hours.  12/06/12   [provider]  ?furosemide (LASIX) 20 MG tablet Take 2 tablets (40 mg total) by mouth daily. 05/17/21 05/12/22  Haydee Salter, MD  ?hydrALAZINE (APRESOLINE) 50 MG tablet Take 1 tablet (50 mg total) by mouth 3 (three) times daily. 05/05/21 08/03/21  [place], Peter M, MD  ?nitroGLYCERIN (NITROSTAT) 0.4 MG SL tablet Place 1 tablet (0.4 mg total) under the tongue every 5 (five) minutes as needed. 07/24/20   [place], Peter M, MD  ?TRAVATAN Z 0.004 % SOLN ophthalmic solution Place 1 drop into the left eye at bedtime.  12/06/12   [provider]  ?   ? ?Allergies    ?Codeine and Penicillins   ? ?Review of Systems   ?Review of Systems ? ?Physical Exam ?Updated Vital Signs ?BP (!) 174/92   Pulse 75   Resp (!) 24   Wt 86.2 kg   SpO2 95%   BMI 30.67 kg/m?  ?Physical Exam ?Vitals and nursing note reviewed.  ?Constitutional:   ?   General: He is not in acute distress. ?   Appearance: He is well-developed. He is not ill-appearing.  ?HENT:  ?   Head: Normocephalic and atraumatic.  ?   Mouth/Throat:  ?   Mouth: Mucous membranes are moist.  ?Eyes:  ?   Conjunctiva/sclera: Conjunctivae normal.  ?   Pupils: Pupils are equal, round, and reactive to light.  ?Cardiovascular:  ?   Rate and Rhythm: Normal rate and regular rhythm.  ?   Heart sounds: No murmur heard. ?Pulmonary:  ?   Effort: Pulmonary effort is normal. No respiratory distress.  ?   Breath sounds: Decreased breath sounds present.  ?Abdominal:  ?   Palpations: Abdomen is soft.  ?   Tenderness: There is no abdominal tenderness.  ?Musculoskeletal:     ?   General: No swelling.  ?   Cervical back: Normal range of motion and neck supple.  ?  Right lower leg: Edema present.  ?   Left lower leg: Edema present.  ?Skin: ?   General: Skin is warm and dry.  ?   Capillary Refill: Capillary refill takes less than 2 seconds.  ?Neurological:  ?   General: No focal deficit present.  ?   Mental Status: He is alert.  ?Psychiatric:     ?   Mood and Affect: Mood normal.  ? ? ?ED Results / Procedures / Treatments   ?Labs ?(all labs ordered are listed, but only abnormal results are displayed) ?Labs Reviewed  ?CBC WITH DIFFERENTIAL/PLATELET - Abnormal; Notable for the following components:  ?    Result Value  ? RBC 3.82 (*)   ? Hemoglobin 11.0 (*)   ? HCT 35.9 (*)   ? All other components within normal limits  ?COMPREHENSIVE METABOLIC PANEL - Abnormal; Notable for the following components:  ? Glucose, Bld 117 (*)   ? BUN 29 (*)   ? Creatinine, Ser 1.59 (*)   ? Calcium 8.5 (*)   ? Total Protein 5.9 (*)   ? Albumin 2.8 (*)    ? GFR, Estimated 40 (*)   ? All other components within normal limits  ?TROPONIN I (HIGH SENSITIVITY) - Abnormal; Notable for the following components:  ? Troponin I (High Sensitivity) 152 (*)   ? All other components within normal limits  ?RESP PANEL BY RT-PCR (FLU A&B, COVID) [id number]  ?BRAIN NATRIURETIC PEPTIDE  ?TROPONIN I (HIGH SENSITIVITY)  ? ? ?EKG ?EKG Interpretation ? ?Date/Time:  Thursday July 29 2021 13:00:49 EDT ?Ventricular Rate:  68 ?PR Interval:  203 ?QRS Duration: 156 ?QT Interval:  466 ?QTC Calculation: 496 ?R Axis:   84 ?Text Interpretation: Sinus rhythm Probable left ventricular hypertrophy Borderline prolonged QT interval Confirmed by Lennice Sites [phone]) on 07/29/2021 1:02:35 PM ? ?Radiology ?DG Chest Portable 1 View ? ?Result Date: 07/29/2021 ?CLINICAL DATA:  Shortness of breath. EXAM: PORTABLE CHEST 1 VIEW COMPARISON:  April 09, 2021. FINDINGS: Stable cardiomegaly. Status post coronary bypass graft. Bibasilar atelectasis or edema is noted. Bony thorax is unremarkable. IMPRESSION: Mild bibasilar subsegmental atelectasis or edema. Aortic Atherosclerosis (ICD10-I70.0). Electronically Signed   By: Marijo Conception M.D.   On: 07/29/2021 13:15   ? ?Procedures ?Procedures  ? ? ?Medications Ordered in ED ?Medications  ?furosemide (LASIX) injection 60 mg (has no administration in time range)  ?hydrALAZINE (APRESOLINE) injection 10 mg (10 mg Intravenous Given 07/29/21 1526)  ? ? ?ED Course/ Medical Decision Making/ A&P ?  ?                        ?Medical Decision Making ?Amount and/or Complexity of Data Reviewed ?Labs: ordered. ?Radiology: ordered. ? ?Risk ?Prescription drug management. ?Decision regarding hospitalization. ? ? ?[name] is here with shortness of breath.  Differential diagnosis is pneumonia versus volume overload versus ACS.  Less likely PE.  Diminished breath sounds throughout.  Does have cardiac history.  No PE history.  Symptoms for the last 4 days.  Worse with exertion.  No  smoking history.  Will check CBC, CMP, troponin, BNP, chest x-ray. ? ?EKG per my review and interpretation shows sinus rhythm.  No ischemic changes. ? ?Per my review and interpretation of chest x-ray concerning for some edema.  Troponin is elevated to 152.  COVID test negative.  Creatinine at baseline.  No other significant anemia or electrolyte abnormality.  Blood pressure was elevated in the 180s.  Overall suspect  that this is more hypertensive emergency/urgency with some volume overload/heart failure.  He is not having chest pain.  EKG with no obvious ischemic changes.  We will give a dose IV Lasix and a dose IV hydralazine.  Will admit to hospitalist.  We will touch base with cardiology as well. ? ?This chart was dictated using voice recognition software.  Despite best efforts to proofread,  errors can occur which can change the documentation meaning.  ? ? ? ? ? ? ? ?Final Clinical Impression(s) / ED Diagnoses ?Final diagnoses:  ?Elevated troponin  ?Acute congestive heart failure, unspecified heart failure type ([place])  ?Hypertensive emergency  ? ? ?Rx / DC Orders ?ED Discharge Orders   ? ? None  ? ?  ? ? ?  ?Lennice Sites, DO ?07/29/21 1531 ? ?

## 2021-07-29 NOTE — Assessment & Plan Note (Addendum)
125>157 ?No chest pain or changes on ekg ?Suspected demand ischemia in setting of decompensated CHF ?Echo shows preserved EF with no WMA ?Cardiology following ?

## 2021-07-29 NOTE — ED Notes (Signed)
EDP at BS 

## 2021-07-30 ENCOUNTER — Observation Stay (HOSPITAL_COMMUNITY): Payer: PPO

## 2021-07-30 DIAGNOSIS — N1831 Chronic kidney disease, stage 3a: Secondary | ICD-10-CM | POA: Diagnosis not present

## 2021-07-30 DIAGNOSIS — Z951 Presence of aortocoronary bypass graft: Secondary | ICD-10-CM | POA: Diagnosis not present

## 2021-07-30 DIAGNOSIS — Z8711 Personal history of peptic ulcer disease: Secondary | ICD-10-CM | POA: Diagnosis not present

## 2021-07-30 DIAGNOSIS — Z66 Do not resuscitate: Secondary | ICD-10-CM | POA: Diagnosis present

## 2021-07-30 DIAGNOSIS — I160 Hypertensive urgency: Secondary | ICD-10-CM | POA: Diagnosis not present

## 2021-07-30 DIAGNOSIS — I5031 Acute diastolic (congestive) heart failure: Secondary | ICD-10-CM | POA: Diagnosis not present

## 2021-07-30 DIAGNOSIS — I6523 Occlusion and stenosis of bilateral carotid arteries: Secondary | ICD-10-CM | POA: Diagnosis present

## 2021-07-30 DIAGNOSIS — Z7982 Long term (current) use of aspirin: Secondary | ICD-10-CM | POA: Diagnosis not present

## 2021-07-30 DIAGNOSIS — N179 Acute kidney failure, unspecified: Secondary | ICD-10-CM | POA: Diagnosis present

## 2021-07-30 DIAGNOSIS — Z880 Allergy status to penicillin: Secondary | ICD-10-CM | POA: Diagnosis not present

## 2021-07-30 DIAGNOSIS — N183 Chronic kidney disease, stage 3 unspecified: Secondary | ICD-10-CM | POA: Diagnosis present

## 2021-07-30 DIAGNOSIS — Z953 Presence of xenogenic heart valve: Secondary | ICD-10-CM | POA: Diagnosis not present

## 2021-07-30 DIAGNOSIS — R778 Other specified abnormalities of plasma proteins: Secondary | ICD-10-CM

## 2021-07-30 DIAGNOSIS — I2510 Atherosclerotic heart disease of native coronary artery without angina pectoris: Secondary | ICD-10-CM | POA: Diagnosis present

## 2021-07-30 DIAGNOSIS — I5033 Acute on chronic diastolic (congestive) heart failure: Secondary | ICD-10-CM | POA: Diagnosis present

## 2021-07-30 DIAGNOSIS — Z885 Allergy status to narcotic agent status: Secondary | ICD-10-CM | POA: Diagnosis not present

## 2021-07-30 DIAGNOSIS — J9811 Atelectasis: Secondary | ICD-10-CM | POA: Diagnosis present

## 2021-07-30 DIAGNOSIS — R531 Weakness: Secondary | ICD-10-CM | POA: Diagnosis present

## 2021-07-30 DIAGNOSIS — Z20822 Contact with and (suspected) exposure to covid-19: Secondary | ICD-10-CM | POA: Diagnosis present

## 2021-07-30 DIAGNOSIS — I130 Hypertensive heart and chronic kidney disease with heart failure and stage 1 through stage 4 chronic kidney disease, or unspecified chronic kidney disease: Secondary | ICD-10-CM | POA: Diagnosis present

## 2021-07-30 DIAGNOSIS — I10 Essential (primary) hypertension: Secondary | ICD-10-CM

## 2021-07-30 DIAGNOSIS — I161 Hypertensive emergency: Secondary | ICD-10-CM | POA: Diagnosis present

## 2021-07-30 DIAGNOSIS — Z79899 Other long term (current) drug therapy: Secondary | ICD-10-CM | POA: Diagnosis not present

## 2021-07-30 DIAGNOSIS — I248 Other forms of acute ischemic heart disease: Secondary | ICD-10-CM | POA: Diagnosis present

## 2021-07-30 DIAGNOSIS — D649 Anemia, unspecified: Secondary | ICD-10-CM | POA: Diagnosis present

## 2021-07-30 DIAGNOSIS — Z8661 Personal history of infections of the central nervous system: Secondary | ICD-10-CM | POA: Diagnosis not present

## 2021-07-30 DIAGNOSIS — E785 Hyperlipidemia, unspecified: Secondary | ICD-10-CM | POA: Diagnosis present

## 2021-07-30 DIAGNOSIS — R0602 Shortness of breath: Secondary | ICD-10-CM | POA: Diagnosis present

## 2021-07-30 DIAGNOSIS — I472 Ventricular tachycardia, unspecified: Secondary | ICD-10-CM | POA: Diagnosis not present

## 2021-07-30 DIAGNOSIS — I480 Paroxysmal atrial fibrillation: Secondary | ICD-10-CM | POA: Diagnosis present

## 2021-07-30 LAB — CBC
HCT: 34 % — ABNORMAL LOW (ref 39.0–52.0)
Hemoglobin: 10.9 g/dL — ABNORMAL LOW (ref 13.0–17.0)
MCH: 29.5 pg (ref 26.0–34.0)
MCHC: 32.1 g/dL (ref 30.0–36.0)
MCV: 91.9 fL (ref 80.0–100.0)
Platelets: 152 10*3/uL (ref 150–400)
RBC: 3.7 MIL/uL — ABNORMAL LOW (ref 4.22–5.81)
RDW: 14.7 % (ref 11.5–15.5)
WBC: 7.6 10*3/uL (ref 4.0–10.5)
nRBC: 0 % (ref 0.0–0.2)

## 2021-07-30 LAB — BASIC METABOLIC PANEL
Anion gap: 7 (ref 5–15)
BUN: 32 mg/dL — ABNORMAL HIGH (ref 8–23)
CO2: 23 mmol/L (ref 22–32)
Calcium: 8.3 mg/dL — ABNORMAL LOW (ref 8.9–10.3)
Chloride: 109 mmol/L (ref 98–111)
Creatinine, Ser: 1.71 mg/dL — ABNORMAL HIGH (ref 0.61–1.24)
GFR, Estimated: 36 mL/min — ABNORMAL LOW (ref >60)
Glucose, Bld: 111 mg/dL — ABNORMAL HIGH (ref 70–99)
Potassium: 4 mmol/L (ref 3.5–5.1)
Sodium: 139 mmol/L (ref 135–145)

## 2021-07-30 LAB — ECHOCARDIOGRAM COMPLETE
AR max vel: 1.01 cm2
AV Area VTI: 1.14 cm2
AV Area mean vel: 1.17 cm2
AV Mean grad: 9 mmHg
AV Peak grad: 16.3 mmHg
Ao pk vel: 2.02 m/s
Area-P 1/2: 2.6 cm2
Calc EF: 45.1 %
Height: 66 in
MV M vel: 0.72 m/s
MV Peak grad: 2 mmHg
S' Lateral: 3.4 cm
Single Plane A2C EF: 42.8 %
Single Plane A4C EF: 46.6 %
Weight: 2942.4 oz

## 2021-07-30 LAB — FERRITIN: Ferritin: 98 ng/mL (ref 24–336)

## 2021-07-30 LAB — IRON AND TIBC
Iron: 32 ug/dL — ABNORMAL LOW (ref 45–182)
Saturation Ratios: 13 % — ABNORMAL LOW (ref 17.9–39.5)
TIBC: 244 ug/dL — ABNORMAL LOW (ref 250–450)
UIBC: 212 ug/dL

## 2021-07-30 LAB — MAGNESIUM: Magnesium: 2.2 mg/dL (ref 1.7–2.4)

## 2021-07-30 NOTE — Evaluation (Signed)
Occupational Therapy Evaluation ?Patient Details ?Name: [name] ?MRN: [id number] ?DOB: 1924/10/05 ?Today's Date: 07/30/2021 ? ? ?History of Present Illness 88 yo male presenting 3/30 with SOB and weakness, suspect acute CHF exacerbation. PMH includes: CAD s/p CABG and CABG revision with AVR, HTN, HLD, CKD III, and peptic ulcer disease.  ? ?Clinical Impression ?  ?PTA, pt was living alone and was independent BADLs and light IADLs; family comes and provide support as needed. Currently, pt requires Min A for UB ADLs, Min-Max A for LB ADLs, and Min Guard A-Min A functional mobility using RW. Pt presenting with decreased activity tolerance as seen by fatigue and decreased SpO2. Requiring 2L during session to maintain SpO2 >90% with basic activity. Pt would benefit from further acute OT to facilitate safe dc. Recommend dc to home with HHOT for further OT to optimize safety, independence with ADLs, and return to PLOF.   ? ?Recommendations for follow up therapy are one component of a multi-disciplinary discharge planning process, led by the attending physician.  Recommendations may be updated based on patient status, additional functional criteria and insurance authorization.  ? ?Follow Up Recommendations ? Home health OT  ?  ?Assistance Recommended at Discharge Frequent or constant Supervision/Assistance  ?Patient can return home with the following A little help with walking and/or transfers;A little help with bathing/dressing/bathroom ? ?  ?Functional Status Assessment ? Patient has had a recent decline in their functional status and demonstrates the ability to make significant improvements in function in a reasonable and predictable amount of time.  ?Equipment Recommendations ? None recommended by OT  ?  ?Recommendations for Other Services PT consult ? ? ?  ?Precautions / Restrictions Precautions ?Precautions: Fall ?Precaution Comments: watch O2, pt requiring 2L for maintaining SpO2 >90% ?Restrictions ?Weight  Bearing Restrictions: No  ? ?  ? ?Mobility Bed Mobility ?Overal bed mobility: Needs Assistance ?Bed Mobility: Supine to Sit ?  ?  ?Supine to sit: Min assist ?  ?  ?General bed mobility comments: MIn A for elevating trunk ?  ? ?Transfers ?Overall transfer level: Needs assistance ?Equipment used: None ?Transfers: Sit to/from Stand ?Sit to Stand: Min guard ?  ?  ?  ?  ?  ?General transfer comment: MIn Guard A for safety and cues to weight shift forward ?  ? ?  ?Balance Overall balance assessment: Needs assistance ?Sitting-balance support: No upper extremity supported ?Sitting balance-Leahy Scale: Good ?  ?  ?Standing balance support: Bilateral upper extremity supported ?Standing balance-Leahy Scale: Poor ?Standing balance comment: Reliant on UE support for MIn Guard-MIn A for standing balance ?  ?  ?  ?  ?  ?  ?  ?  ?  ?  ?  ?   ? ?ADL either performed or assessed with clinical judgement  ? ?ADL Overall ADL's : Needs assistance/impaired ?Eating/Feeding: Set up;Sitting ?  ?Grooming: Set up;Supervision/safety;Sitting ?  ?Upper Body Bathing: Minimal assistance;Sitting ?  ?Lower Body Bathing: Minimal assistance;Sit to/from stand ?  ?Upper Body Dressing : Minimal assistance;Sitting ?  ?Lower Body Dressing: Maximal assistance;Sit to/from stand ?Lower Body Dressing Details (indicate cue type and reason): don socks ?Toilet Transfer: Min guard;Ambulation (simulated to recliner) ?Toilet Transfer Details (indicate cue type and reason): Mi nguard A for safety ?  ?  ?  ?  ?Functional mobility during ADLs: Min guard;Rolling walker (2 wheels) ?General ADL Comments: Pt presenting with decreased activity tolerance as seen by drops in SpO2 with positional changes and mobility.  ? ? ? ?Vision   ?   ?   ?  Perception   ?  ?Praxis   ?  ? ?Pertinent Vitals/Pain Pain Assessment ?Pain Assessment: No/denies pain  ? ? ? ?Hand Dominance Left ?  ?Extremity/Trunk Assessment Upper Extremity Assessment ?Upper Extremity Assessment: Generalized  weakness ?  ?Lower Extremity Assessment ?Lower Extremity Assessment: Defer to PT evaluation ?  ?Cervical / Trunk Assessment ?Cervical / Trunk Assessment: Kyphotic ?  ?Communication Communication ?Communication: HOH (with hearing aides) ?  ?Cognition Arousal/Alertness: Awake/alert ?Behavior During Therapy: [facility] for tasks assessed/performed ?Overall Cognitive Status: No family/caregiver present to determine baseline cognitive functioning ?  ?  ?  ?  ?  ?  ?  ?  ?  ?  ?  ?  ?  ?  ?  ?  ?General Comments: Difficult to discern confusion vs HOH, pt answering most questions appropriately, some tangents. ?  ?  ?General Comments  SpO2 >90% on RA in supine. SpO2 dropping to 87% while sitting at EOB on RA. Pt requiring 2LO2 to maintain SpO2 >90% during activity. ? ?  ?Exercises   ?  ?Shoulder Instructions    ? ? ?Home Living Family/patient expects to be discharged to:: Private residence ?Living Arrangements: Alone ?Available Help at Discharge: Family;Available PRN/intermittently ?Type of Home: House ?Home Access: Stairs to enter ?Entrance Stairs-Number of Steps: 2 + 1 from the kitchen to the dining room ?Entrance Stairs-Rails: Right ?Home Layout: One level ?  ?  ?Bathroom Shower/Tub: Tub/shower unit;Walk-in shower ?  ?Bathroom Toilet: Standard ?  ?  ?Home Equipment: Rollator (4 wheels);Shower Counsellor (2 wheels);Cane - quad;Cane - single point ?  ?  ?  ? ?  ?Prior Functioning/Environment Prior Level of Function : Independent/Modified Independent ?  ?  ?  ?  ?  ?  ?Mobility Comments: pt reports using walker "most of the time" and cane "sometimes" ?ADLs Comments: Performing "all I wanted" doing the ADLs and light IADLs. ?  ? ?  ?  ?OT Problem List: Decreased strength;Decreased range of motion;Decreased activity tolerance;Impaired balance (sitting and/or standing);Decreased knowledge of use of DME or AE;Decreased knowledge of precautions ?  ?   ?OT Treatment/Interventions: Self-care/ADL training;Therapeutic  exercise;Energy conservation;DME and/or AE instruction;Therapeutic activities;Patient/family education  ?  ?OT Goals(Current goals can be found in the care plan section) Acute Rehab OT Goals ?Patient Stated Goal: Go home ?OT Goal Formulation: With patient ?Time For Goal Achievement: 08/13/21 ?Potential to Achieve Goals: Good  ?OT Frequency: Min 2X/week ?  ? ?Co-evaluation   ?  ?  ?  ?  ? ?  ?AM-PAC OT "6 Clicks" Daily Activity     ?Outcome Measure Help from another person eating meals?: A Little ?Help from another person taking care of personal grooming?: A Little ?Help from another person toileting, which includes using toliet, bedpan, or urinal?: A Lot ?Help from another person bathing (including washing, rinsing, drying)?: A Lot ?Help from another person to put on and taking off regular upper body clothing?: A Little ?Help from another person to put on and taking off regular lower body clothing?: A Lot ?6 Click Score: 15 ?  ?End of Session Equipment Utilized During Treatment: Rolling walker (2 wheels);Oxygen (2L) ?Nurse Communication: Mobility status;Other (comment) (SpO2 not readying) ? ?Activity Tolerance: Patient tolerated treatment well ?Patient left: in chair;with call bell/phone within reach ? ?OT Visit Diagnosis: Unsteadiness on feet (R26.81);Other abnormalities of gait and mobility (R26.89);Muscle weakness (generalized) (M62.81)  ?              ?Time: 6314-9702 ?OT Time Calculation (min): 21  min ?Charges:  OT General Charges ?$OT Visit: 1 Visit ?OT Evaluation ?$OT Eval Moderate Complexity: 1 Mod ? ?Dixie Jafri MSOT, OTR/L ?Acute Rehab ?Pager: [phone] ?Office: [phone] ? ? ?Tysheena Ginzburg M Clatie Kessen ?07/30/2021, 11:26 AM ?

## 2021-07-30 NOTE — Progress Notes (Signed)
?  Progress Note ? ? ?Patient: [name] [id number] DOB: [birth year] DOA: 07/29/2021     0 ?DOS: the patient was seen and examined on 07/30/2021 ?  ?Brief hospital course: ?88 y/o male admitted to the hospital with generalized weakness and dyspnea on exertion. Found to have decompensated CHF. On IV lasix. Also had elevated troponin, felt to be demand ischemia. Cardiology following.  ? ?Assessment and Plan: ?Acute on chronic diastolic CHF (congestive heart failure) ([employer]) ?Still short of breath on exertion ?Cardiology following ?Continuing on IV lasix 40mg  bid for now ?Monitor intake and output/daily weights ?Echo pending ?Coreg and hydralazine ? ? ?Elevated troponin ?125>157 ?No chest pain or changes on ekg ?Suspected demand ischemia in setting of decompensated CHF ?Echo ordered ?Cardiology following ? ?Coronary artery disease ?History of CABG with revision and AVR in 2009.  ?No chest pain or changes on ekg ?Continue ASA/statin/coreg  ? ?Essential hypertension ?Has had orthostasis/low bp at home with higher doses of medication ?[id number] systolic goal ?Continue coreg/hydralazine. Holding norvasc until EF results  ? ?CKD (chronic kidney disease), stage III ([place]) ?Baseline creatinine around 1.3-1.5 ?Mild uptrend in creatinine, likely related to lasix ?Continue to monitor with diuresis  ?Would advise to stop using NSAIDs.  ? ?Hyperlipidemia ?Continue lipitor  ? ?Status post aortic valve replacement with tissue ?Status post aortic valve replacement with a #23 mm pericardial tissue valve in January of 2009. ?Echo in 12/22: stabile valve ?Repeat echo pending  ? ?Weakness ?Likely multifactorial ?Seen by PT/OT with recommendations for [facility] ?Although it is recommended that patient have frequent or constant supervision/assistance ?Will need to discuss with family if they are able to provide this level of supervision, since he had been living independently to this point ?Will monitor his progression ? ?Normocytic anemia ?Likely  ACD ?Baseline hgb: 11-12 ?Hemoglobin currently near baseline ? ? ? ? ?  ? ?Subjective: still feels short of breath on exertion. Feels weak, no chest pain ? ?Physical Exam: ?Vitals:  ? 07/30/21 0755 07/30/21 0919 07/30/21 1159 07/30/21 1621  ?BP: (!) 155/66 (!) 145/60 (!) 126/56 (!) 141/89  ?Pulse: 65  73   ?Resp: 15  (!) 21   ?Temp: 99.8 ?F (37.7 ?C)  98.2 ?F (36.8 ?C)   ?TempSrc: Oral  Oral   ?SpO2: 98%  95%   ?Weight:      ?Height:      ? ?General exam: Alert, awake, oriented x 3 ?Respiratory system: Clear to auscultation. Respiratory effort normal. ?Cardiovascular system:RRR. No murmurs, rubs, gallops. ?Gastrointestinal system: Abdomen is nondistended, soft and nontender. No organomegaly or masses felt. Normal bowel sounds heard. ?Central nervous system: Alert and oriented. No focal neurological deficits. ?Extremities: 1+ edema bilaterally ?Skin: No rashes, lesions or ulcers ?Psychiatry: Judgement and insight appear normal. Mood & affect appropriate.  ? ?Data Reviewed: ? ?Reviewed chest xray, cbc and chemistry. Slight bump up of creatinine.  ? ?Family Communication: updated patient son [name] over the phone ? ?Disposition: ?Status is: Observation ?The patient will require care spanning > 2 midnights and should be moved to inpatient because: needs further IV diuresis ? Planned Discharge Destination: Home with Home Health ? ? ? ?Time spent: 35 minutes ? ?Author: ?Kathie Dike, MD ?07/30/2021 5:51 PM ? ?For on call review [email or web address].  ?

## 2021-07-30 NOTE — Hospital Course (Signed)
88 y/o male admitted to the hospital with generalized weakness and dyspnea on exertion. Found to have decompensated CHF. On IV lasix. Also had elevated troponin, felt to be demand ischemia. Cardiology following.  ?

## 2021-07-30 NOTE — Evaluation (Signed)
Physical Therapy Evaluation ?Patient Details ?Name: [name] ?MRN: [id number] ?DOB: Jan 17, 1925 ?Today's Date: 07/30/2021 ? ?History of Present Illness ? The pt is a 88 yo male presenting 3/30 with SOB and weakness, suspect acute CHF exacerbation. PMH includes: CAD s/p CABG and CABG revision with AVR, HTN, HLD, CKD III, and peptic ulcer disease. ?  ?Clinical Impression ? Pt in bed upon arrival of PT, agreeable to evaluation at this time. Prior to admission the pt was mobilizing at home with use of cane or rollator, but reports independent with self-care. He no longer drives but has local family who take him to appointments and errands. The pt now presents with limitations in functional mobility, strength, stability, endurance, and power due to above dx, and will continue to benefit from skilled PT to address these deficits. The pt was able to rise to standing, but needed mod cues for hand placement and minA to steady with initial stand. The pt was then able to complete short bouts of ambulation in the room with 2L and minG, required prolonged seated rest after each 15 ft bout of activity due to fatigue. Will continue to benefit from skilled PT acutely and after d/c to facilitate return to independence and safety with mobility in the home.  ?   ?   ? ?Recommendations for follow up therapy are one component of a multi-disciplinary discharge planning process, led by the attending physician.  Recommendations may be updated based on patient status, additional functional criteria and insurance authorization. ? ?Follow Up Recommendations Home health PT ? ?  ?Assistance Recommended at Discharge Frequent or constant Supervision/Assistance  ?Patient can return home with the following ? A little help with walking and/or transfers;A little help with bathing/dressing/bathroom;Assistance with cooking/housework;Assist for transportation;Help with stairs or ramp for entrance ? ?  ?Equipment Recommendations None recommended by PT   ?Recommendations for Other Services ?    ?  ?Functional Status Assessment Patient has had a recent decline in their functional status and demonstrates the ability to make significant improvements in function in a reasonable and predictable amount of time.  ? ?  ?Precautions / Restrictions Precautions ?Precautions: Fall ?Precaution Comments: watch O2, pt on 2L this session ?Restrictions ?Weight Bearing Restrictions: No  ? ?  ? ?Mobility ? Bed Mobility ?Overal bed mobility: Needs Assistance ?Bed Mobility: Supine to Sit, Sit to Supine ?  ?  ?Supine to sit: Min assist ?Sit to supine: Min assist ?  ?General bed mobility comments: minA with pt pulling up on therapist to come to sitting EOB, unable to rise from flat bed. states bed at home has rails ?  ? ?Transfers ?Overall transfer level: Needs assistance ?Equipment used: Rolling walker (2 wheels) ?Transfers: Sit to/from Stand ?Sit to Stand: Min guard ?  ?  ?  ?  ?  ?General transfer comment: minG with cues for hand placement on chair or grab bars rather than RW, increased time to power up ?  ? ?Ambulation/Gait ?Ambulation/Gait assistance: Min guard ?Gait Distance (Feet): 15 Feet (+ 15 ft) ?Assistive device: Rolling walker (2 wheels) ?Gait Pattern/deviations: Step-through pattern, Decreased stride length, Trunk flexed ?Gait velocity: decreased ?Gait velocity interpretation: <1.31 ft/sec, indicative of household ambulator ?  ?General Gait Details: small steps with minimal clearance, no overt LOB but heavy dependence on UE support on RW ? ?  ? ?Balance Overall balance assessment: Needs assistance ?Sitting-balance support: No upper extremity supported ?Sitting balance-Leahy Scale: Good ?  ?  ?Standing balance support: Bilateral upper extremity supported ?  Standing balance-Leahy Scale: Poor ?Standing balance comment: BUE support on RW for stance ?  ?  ?  ?  ?  ?  ?  ?  ?  ?  ?  ?   ? ? ? ?Pertinent Vitals/Pain Pain Assessment ?Pain Assessment: No/denies pain  ? ? ?Home Living  Family/patient expects to be discharged to:: Private residence ?Living Arrangements: Alone ?Available Help at Discharge: Family;Available PRN/intermittently ?Type of Home: House ?Home Access: Stairs to enter ?Entrance Stairs-Rails: Right ?Entrance Stairs-Number of Steps: 2 + 1 from the kitchen to the dining room ?  ?Home Layout: One level ?Home Equipment: Rollator (4 wheels);Shower Counsellor (2 wheels);Cane - quad;Cane - single point ?   ?  ?Prior Function Prior Level of Function : Independent/Modified Independent ?  ?  ?  ?  ?  ?  ?Mobility Comments: pt reports using walker "most of the time" and cane "sometimes" ?ADLs Comments: Performing "all I wanted" doing the ADLs and light IADLs. ?  ? ? ?Hand Dominance  ? Dominant Hand: Right ? ?  ?Extremity/Trunk Assessment  ?   ?  ? ?  ?  ? ?Cervical / Trunk Assessment ?Cervical / Trunk Assessment: Kyphotic  ?Communication  ? Communication: HOH (with hearing aides)  ?Cognition Arousal/Alertness: Awake/alert ?Behavior During Therapy: [facility] for tasks assessed/performed ?Overall Cognitive Status: No family/caregiver present to determine baseline cognitive functioning ?  ?  ?  ?  ?  ?  ?  ?  ?  ?  ?  ?  ?  ?  ?  ?  ?General Comments: difficult to discern confusion vs HOH, pt answering most questions appropriately, some tangents. ?  ?  ? ?  ?General Comments General comments (skin integrity, edema, etc.): SpO2 stable on 3L upon arrival, maintained in 90s on 1L at rest, low of 85% with mobility, therefore needed 2L O2 to maintain with activity ? ?  ?Exercises    ? ?Assessment/Plan  ?  ?PT Assessment Patient needs continued PT services  ?PT Problem List Decreased range of motion;Decreased activity tolerance;Decreased balance;Decreased mobility;Decreased safety awareness;Cardiopulmonary status limiting activity ? ?   ?  ?PT Treatment Interventions DME instruction;Gait training;Stair training;Functional mobility training;Therapeutic activities;Therapeutic exercise;Balance  training;Patient/family education   ? ?PT Goals (Current goals can be found in the Care Plan section)  ?Acute Rehab PT Goals ?Patient Stated Goal: return home ?PT Goal Formulation: With patient ?Time For Goal Achievement: 08/13/21 ?Potential to Achieve Goals: Good ? ?  ?Frequency Min 3X/week ?  ? ? ?   ?AM-PAC PT "6 Clicks" Mobility  ?Outcome Measure Help needed turning from your back to your side while in a flat bed without using bedrails?: A Little ?Help needed moving from lying on your back to sitting on the side of a flat bed without using bedrails?: A Little ?Help needed moving to and from a bed to a chair (including a wheelchair)?: A Little ?Help needed standing up from a chair using your arms (e.g., wheelchair or bedside chair)?: A Little ?Help needed to walk in hospital room?: A Little ?Help needed climbing 3-5 steps with a railing? : A Lot ?6 Click Score: 17 ? ?  ?End of Session Equipment Utilized During Treatment: Gait belt ?Activity Tolerance: Patient tolerated treatment well ?Patient left: in bed;with call bell/phone within reach;with bed alarm set (waiting on echo) ?Nurse Communication: Mobility status ?PT Visit Diagnosis: Unsteadiness on feet (R26.81);Other abnormalities of gait and mobility (R26.89);Muscle weakness (generalized) (M62.81) ?  ? ?Time: 7793-9030 ?PT Time Calculation (  min) (ACUTE ONLY): 31 min ? ? ?Charges:   PT Evaluation ?$PT Eval Low Complexity: 1 Low ?PT Treatments ?$Therapeutic Exercise: 8-22 mins ?  ?   ? ? ?Vickki MuffKatie Zhou, PT, DPT  ? ?Acute Rehabilitation Department ?Pager #: [phone] ? ?Ronnie DerbyKatie F Zhou ?07/30/2021, 9:47 AM ? ?

## 2021-07-30 NOTE — Progress Notes (Signed)
? ?  Echocardiogram ?2D Echocardiogram has been performed. ? ?[name] ?07/30/2021, 1:52 PM ?

## 2021-07-30 NOTE — Progress Notes (Addendum)
? ?Progress Note ? ?Patient Name: [name] ?Date of Encounter: 07/30/2021 ? ?[facility] Cardiologist: Peter Martinique, MD  ? ?Subjective  ? ?Incomplete I/os.  Mild bump in creatinine (1.59 > 1.71).  BP 145/60 this morning.  Weight recorded is down 6 pounds from yesterday ? ?Inpatient Medications  ?  ?Scheduled Meds: ? aspirin  81 mg Oral Daily  ? atorvastatin  40 mg Oral Daily  ? timolol  1 drop Left Eye BID  ? And  ? brimonidine  1 drop Left Eye BID  ? carvedilol  6.25 mg Oral BID WC  ? enoxaparin (LOVENOX) injection  30 mg Subcutaneous Q24H  ? furosemide  40 mg Intravenous Q12H  ? hydrALAZINE  50 mg Oral TID  ? latanoprost  1 drop Left Eye QHS  ? sodium chloride flush  3 mL Intravenous Q12H  ? ?Continuous Infusions: ? sodium chloride    ? ?PRN Meds: ?sodium chloride, acetaminophen **OR** acetaminophen, albuterol, sodium chloride flush  ? ?Vital Signs  ?  ?Vitals:  ? 07/30/21 0001 07/30/21 0406 07/30/21 0755 07/30/21 0919  ?BP: (!) 142/52 (!) 142/61 (!) 155/66 (!) 145/60  ?Pulse: 69 78 65   ?Resp:   15   ?Temp: 98.9 ?F (37.2 ?C) 98.6 ?F (37 ?C) 99.8 ?F (37.7 ?C)   ?TempSrc: Oral Oral Oral   ?SpO2: 98% 98% 98%   ?Weight:  83.4 kg    ?Height:      ? ? ?Intake/Output Summary (Last 24 hours) at 07/30/2021 1034 ?Last data filed at 07/29/2021 1700 ?Gross per 24 hour  ?Intake --  ?Output 800 ml  ?Net -800 ml  ? ? ?  07/30/2021  ?  4:06 AM 07/29/2021  ?  6:21 PM 07/29/2021  ?  1:14 PM  ?Last 3 Weights  ?Weight (lbs) 183 lb 14.4 oz 190 lb 190 lb  ?Weight (kg) 83.416 kg 86.183 kg 86.183 kg  ?   ? ?Telemetry  ?  ?NSR 70-80s - Personally Reviewed ? ?ECG  ?  ?No new ECG- Personally Reviewed ? ?Physical Exam  ? ?GEN: No acute distress.   ?Neck: No JVD ?Cardiac: RRR, no murmurs, rubs, or gallops.  ?Respiratory: Diminished breath sounds ?GI: Soft, nontender ?MS: 1+ edema ?Neuro:  Nonfocal  ?Psych: Normal affect  ? ?Labs  ?  ?High Sensitivity Troponin:   ?Recent Labs  ?Lab 07/29/21 ?1255 07/29/21 ?1506 07/29/21 ?1858  07/29/21 ?2239  ?TROPONINIHS 152* 157* 134* 148*  ?   ?Chemistry ?Recent Labs  ?Lab 07/29/21 ?1255 07/29/21 ?1858 07/30/21 ?0254  ?NA 141  --  139  ?K 4.2  --  4.0  ?CL 110  --  109  ?CO2 26  --  23  ?GLUCOSE 117*  --  111*  ?BUN 29*  --  32*  ?CREATININE 1.59*  --  1.71*  ?CALCIUM 8.5*  --  8.3*  ?MG  --  2.2 2.2  ?PROT 5.9*  --   --   ?ALBUMIN 2.8*  --   --   ?AST 20  --   --   ?ALT 12  --   --   ?ALKPHOS 67  --   --   ?BILITOT 1.2  --   --   ?GFRNONAA 40*  --  36*  ?ANIONGAP 5  --  7  ?  ?Lipids No results for input(s): CHOL, TRIG, HDL, LABVLDL, LDLCALC, CHOLHDL in the last 168 hours.  ?Hematology ?Recent Labs  ?Lab 07/29/21 ?1255 07/30/21 ?0254  ?WBC 8.2 7.6  ?  RBC 3.82* 3.70*  ?HGB 11.0* 10.9*  ?HCT 35.9* 34.0*  ?MCV 94.0 91.9  ?MCH 28.8 29.5  ?MCHC 30.6 32.1  ?RDW 14.7 14.7  ?PLT 165 152  ? ?Thyroid  ?Recent Labs  ?Lab 07/29/21 ?1858  ?TSH 3.515  ?  ?BNP ?Recent Labs  ?Lab 07/29/21 ?1255  ?BNP 1,087.8*  ?  ?DDimer No results for input(s): DDIMER in the last 168 hours.  ? ?Radiology  ?  ?DG Chest Portable 1 View ? ?Result Date: 07/29/2021 ?CLINICAL DATA:  Shortness of breath. EXAM: PORTABLE CHEST 1 VIEW COMPARISON:  April 09, 2021. FINDINGS: Stable cardiomegaly. Status post coronary bypass graft. Bibasilar atelectasis or edema is noted. Bony thorax is unremarkable. IMPRESSION: Mild bibasilar subsegmental atelectasis or edema. Aortic Atherosclerosis (ICD10-I70.0). Electronically Signed   By: Marijo Conception M.D.   On: 07/29/2021 13:15   ? ?Cardiac Studies  ? ?Echo 04/09/21: ? 1. Left ventricular ejection fraction, by estimation, is 50 to 55%. The  ?left ventricle has low normal function. The left ventricle has no regional  ?wall motion abnormalities. There is mild left ventricular hypertrophy.  ?Left ventricular diastolic  ?parameters are consistent with Grade I diastolic dysfunction (impaired  ?relaxation).  ? 2. Right ventricular systolic function is mildly reduced. The right  ?ventricular size is normal. There  is normal pulmonary artery systolic  ?pressure.  ? 3. Left atrial size was severely dilated.  ? 4. Right atrial size was moderately dilated.  ? 5. The mitral valve is grossly normal. Mild mitral valve regurgitation.  ? 6. Prosthetic valve is well seated, no paravalvular leak. AV max 2.4 m/s.  ?The aortic valve is grossly normal. Aortic valve regurgitation is not  ?visualized. No aortic stenosis is present. There is a 23 mm Edwards bovine  ?valve present in the aortic  ?position. Procedure Date: 2009.  ? 7. The inferior vena cava is normal in size with greater than 50%  ?respiratory variability, suggesting right atrial pressure of 3 mmHg.  ? ?Patient Profile  ?   ?88 y.o. male with history of CAD status post CABG, aortic stenosis status post AVR in 2008, paroxysmal atrial fibrillation, PVD we are consultedfor evaluation of heart failure ? ?Assessment & Plan  ?  ?Acute on Chronic Diastolic Heart Failure: Most recent echocardiogram 04/2021 showed EF [id number], grade I diastolic dysfunction, mildly reduced RV systolic function.  Presented with weakness and shortness of breath.  BNP elevated to 1088.  Chest x-ray showed mild bibasilar segmental atelectasis or edema  ?- Continue lasix 40 mg IV bid  ?- Strict I's/O's, daily weights ?- Continue coreg 6.25 mg BID  ?- Continue hydralazine 50 mg TID  ?- Echocardiogram  ?- GDMT limited due to renal function ?  ?Elevated Troponins: h/o CAD s/p CABG 2008.  Patient denies any chest pain. hsTn 152>>157 ?-Suspect demand ischemia in setting of decompensated heart failure as above ?-Continue asa, statin, BB ?  ?Aortic Stenosis s/p aortic valve replacement: Prosthetic valve normal functioning on echo in 04/2021 ?- Echo  ?  ?HLD  ?- Continue statin  ?  ?HTN  ?- continue carvedilol, hydralazine, norvasc, lasix ? ?AKI on CKD ?-Creatinine 1.59 on admission, increased from 0.89 on 06/21/2021.  Creatinine 1.7 today.  Labs on 2/20 may be spurious, creatinine otherwise has been 1.3-1.7. ? ?For  questions or updates, please contact [place] ?Please consult [email or web address] for contact info under  ? ?  ?   ?Signed, ?Donato Heinz, MD  ?07/30/2021, 10:34 AM   ? ?

## 2021-07-31 DIAGNOSIS — N1831 Chronic kidney disease, stage 3a: Secondary | ICD-10-CM

## 2021-07-31 DIAGNOSIS — R778 Other specified abnormalities of plasma proteins: Secondary | ICD-10-CM

## 2021-07-31 LAB — BASIC METABOLIC PANEL
Anion gap: 6 (ref 5–15)
BUN: 37 mg/dL — ABNORMAL HIGH (ref 8–23)
CO2: 27 mmol/L (ref 22–32)
Calcium: 8.2 mg/dL — ABNORMAL LOW (ref 8.9–10.3)
Chloride: 107 mmol/L (ref 98–111)
Creatinine, Ser: 1.76 mg/dL — ABNORMAL HIGH (ref 0.61–1.24)
GFR, Estimated: 35 mL/min — ABNORMAL LOW (ref >60)
Glucose, Bld: 100 mg/dL — ABNORMAL HIGH (ref 70–99)
Potassium: 3.8 mmol/L (ref 3.5–5.1)
Sodium: 140 mmol/L (ref 135–145)

## 2021-07-31 LAB — MAGNESIUM: Magnesium: 2.2 mg/dL (ref 1.7–2.4)

## 2021-07-31 MED ORDER — AMLODIPINE BESYLATE 2.5 MG PO TABS
2.5000 mg | ORAL_TABLET | Freq: Every day | ORAL | Status: DC
  Administered 2021-07-31 – 2021-08-02 (×3): 2.5 mg via ORAL
  Filled 2021-07-31 (×3): qty 1

## 2021-07-31 NOTE — Plan of Care (Signed)
?  Problem: Activity: ?Goal: Capacity to carry out activities will improve ?Outcome: Progressing ?  ?Problem: Cardiac: ?Goal: Ability to achieve and maintain adequate cardiopulmonary perfusion will improve ?Outcome: Progressing ?  ?Problem: Clinical Measurements: ?Goal: Will remain free from infection ?Outcome: Progressing ?Goal: Respiratory complications will improve ?Outcome: Progressing ?  ?Problem: Activity: ?Goal: Risk for activity intolerance will decrease ?Outcome: Progressing ?  ?Problem: Elimination: ?Goal: Will not experience complications related to urinary retention ?Outcome: Progressing ?  ?Problem: Safety: ?Goal: Ability to remain free from injury will improve ?Outcome: Progressing ?  ?Problem: Skin Integrity: ?Goal: Risk for impaired skin integrity will decrease ?Outcome: Progressing ?  ?

## 2021-07-31 NOTE — Progress Notes (Signed)
?  Progress Note ? ? ?Patient: [name] [id number] DOB: 16-Dec-1924 DOA: 07/29/2021     1 ?DOS: the patient was seen and examined on 07/31/2021 ?  ?Brief hospital course: ?88 y/o male admitted to the hospital with generalized weakness and dyspnea on exertion. Found to have decompensated CHF. On IV lasix. Also had elevated troponin, felt to be demand ischemia. Cardiology following.  ? ?Assessment and Plan: ?Acute on chronic diastolic CHF (congestive heart failure) ([place]) ?Still short of breath on exertion, but feels as though improving ?Cardiology following ?Continuing on IV lasix 40mg  bid for now ?Monitor intake and output/daily weights ?Echo shows preserved EF ?Coreg and hydralazine ? ? ?Elevated troponin ?125>157 ?No chest pain or changes on ekg ?Suspected demand ischemia in setting of decompensated CHF ?Echo shows preserved EF with no WMA ?Cardiology following ? ?Coronary artery disease ?History of CABG with revision and AVR in 2009.  ?No chest pain or changes on ekg ?Continue ASA/statin/coreg  ? ?Essential hypertension ?Has had orthostasis/low bp at home with higher doses of medication ?[id number] systolic goal ?Continue coreg/hydralazine.  ?BPs have been stable ? ?CKD (chronic kidney disease), stage III ([place]) ?Baseline creatinine around 1.3-1.5 ?Mild uptrend in creatinine, likely related to lasix ?Continue to monitor with diuresis  ?Would advise to stop using NSAIDs.  ? ?Hyperlipidemia ?Continue lipitor  ? ?Status post aortic valve replacement with tissue ?Status post aortic valve replacement with a #23 mm pericardial tissue valve in January of 2009. ?Echo shows stable valve function ? ?Weakness ?Likely multifactorial ?Seen by PT/OT with recommendations for [facility] ?Although it is recommended that patient have frequent or constant supervision/assistance ?Will need to discuss with family if they are able to provide this level of supervision, since he had been living independently to this point ?Will monitor his  progression ? ?Normocytic anemia ?Likely ACD ?Baseline hgb: 11-12 ?Hemoglobin currently near baseline ? ? ? ? ?  ? ?Subjective: still feels weak and short of breath. No chest pain ? ?Physical Exam: ?Vitals:  ? 07/30/21 1621 07/30/21 1926 07/31/21 0317 07/31/21 0810  ?BP: (!) 141/89 133/60 (!) 131/58 (!) 141/52  ?Pulse:  74 77   ?Resp:  20 20   ?Temp:  99.7 ?F (37.6 ?C) 99.1 ?F (37.3 ?C)   ?TempSrc:  Oral Oral   ?SpO2:  95% 96%   ?Weight:      ?Height:      ? ?General exam: Alert, awake, oriented x 3 ?Respiratory system: crackles at bases. Respiratory effort normal. ?Cardiovascular system:RRR. No murmurs, rubs, gallops. ?Gastrointestinal system: Abdomen is nondistended, soft and nontender. No organomegaly or masses felt. Normal bowel sounds heard. ?Central nervous system: Alert and oriented. No focal neurological deficits. ?Extremities: No C/C/E, +pedal pulses ?Skin: No rashes, lesions or ulcers ?Psychiatry: Judgement and insight appear normal. Mood & affect appropriate.  ? ?Data Reviewed: ? ?Reviewed chemistry panel. Creatinine stable overnight with diuresis ? ?Family Communication: updated son over the phone ? ?Disposition: ?Status is: Inpatient ?Remains inpatient appropriate because: continued IV diuresis ? Planned Discharge Destination: Home with Home Health ? ? ? ?Time spent: 35 minutes ? ?Author: ?Kathie Dike, MD ?07/31/2021 12:34 PM ? ?For on call review [email or web address].  ?

## 2021-07-31 NOTE — Progress Notes (Signed)
Physical Therapy Treatment ?Patient Details ?Name: [name] ?MRN: [id number] ?DOB: [birth year] ?Today's Date: 07/31/2021 ? ? ?History of Present Illness The pt is a 88 yo male presenting 3/30 with SOB and weakness, suspect acute CHF exacerbation. PMH includes: CAD s/p CABG and CABG revision with AVR, HTN, HLD, CKD III, and peptic ulcer disease. ? ?  ?PT Comments  ? ? The pt was able to demo good progress with hallway ambulation, needing only minG for safety and 1L to maintain SpO2 > 90%. The pt required 2 min seated rest after 100 ft ambulation, will continue to benefit from skilled PT to progress endurance and stability, pt remains hopeful for return home.  ? ?Gait Speed: 0.60m/s using RW and with 1L O2. (Gait speed <0.66m/s indicates increased risk of falls and dependence in ADLs) ?  ?Recommendations for follow up therapy are one component of a multi-disciplinary discharge planning process, led by the attending physician.  Recommendations may be updated based on patient status, additional functional criteria and insurance authorization. ? ?Follow Up Recommendations ? Home health PT ?  ?  ?Assistance Recommended at Discharge Frequent or constant Supervision/Assistance initially, then progress to intermittent.  ?  ?Patient can return home with the following A little help with walking and/or transfers;A little help with bathing/dressing/bathroom;Assistance with cooking/housework;Assist for transportation;Help with stairs or ramp for entrance ?  ?Equipment Recommendations ? None recommended by PT  ?  ?Recommendations for Other Services   ? ? ?  ?Precautions / Restrictions Precautions ?Precautions: Fall ?Precaution Comments: watch O2, pt on 1L this session ?Restrictions ?Weight Bearing Restrictions: No  ?  ? ?Mobility ? Bed Mobility ?Overal bed mobility: Needs Assistance ?Bed Mobility: Supine to Sit, Sit to Supine ?  ?  ?Supine to sit: Min guard ?Sit to supine: Min guard ?  ?General bed mobility comments: no assist  given, pt using bed rail to sit. able to return BLE to bed without assist ?  ? ?Transfers ?Overall transfer level: Needs assistance ?Equipment used: Rolling walker (2 wheels) ?Transfers: Sit to/from Stand ?Sit to Stand: Min guard ?  ?  ?  ?  ?  ?General transfer comment: minG with cues for hand placement on chair or grab bars rather than RW, increased time to power up ?  ? ?Ambulation/Gait ?Ambulation/Gait assistance: Min guard ?Gait Distance (Feet): 100 Feet ?Assistive device: Rolling walker (2 wheels) ?Gait Pattern/deviations: Step-through pattern, Decreased stride length, Trunk flexed ?Gait velocity: 0.28 m/s ?Gait velocity interpretation: <1.31 ft/sec, indicative of household ambulator ?  ?General Gait Details: generally stable with RW, SpO2 stable on 1L with hallway ambulation. Spo2 96% ? ? ? ? ?  ?Balance Overall balance assessment: Needs assistance ?Sitting-balance support: No upper extremity supported ?Sitting balance-Leahy Scale: Good ?  ?  ?Standing balance support: Bilateral upper extremity supported ?Standing balance-Leahy Scale: Poor ?Standing balance comment: BUE support on RW for stance ?  ?  ?  ?  ?  ?  ?  ?  ?  ?  ?  ?  ? ?  ?Cognition Arousal/Alertness: Awake/alert ?Behavior During Therapy: [facility] for tasks assessed/performed ?Overall Cognitive Status: No family/caregiver present to determine baseline cognitive functioning ?  ?  ?  ?  ?  ?  ?  ?  ?  ?  ?  ?  ?  ?  ?  ?  ?General Comments: pt following cues well, slightly decreased insight to safety and tangential through session ?  ?  ? ?  ?Exercises   ? ?  ?  General Comments General comments (skin integrity, edema, etc.): VSS on 1L, 87-93% on RA at rest, maintained with 1L > 90% ?  ?  ? ?Pertinent Vitals/Pain Pain Assessment ?Pain Assessment: No/denies pain  ? ? ? ?PT Goals (current goals can now be found in the care plan section) Acute Rehab PT Goals ?Patient Stated Goal: return home ?PT Goal Formulation: With patient ?Time For Goal Achievement:  08/13/21 ?Potential to Achieve Goals: Good ?Progress towards PT goals: Progressing toward goals ? ?  ?Frequency ? ? ? Min 3X/week ? ? ? ?  ?PT Plan Current plan remains appropriate  ? ? ?   ?AM-PAC PT "6 Clicks" Mobility   ?Outcome Measure ? Help needed turning from your back to your side while in a flat bed without using bedrails?: None ?Help needed moving from lying on your back to sitting on the side of a flat bed without using bedrails?: None ?Help needed moving to and from a bed to a chair (including a wheelchair)?: A Little ?Help needed standing up from a chair using your arms (e.g., wheelchair or bedside chair)?: A Little ?Help needed to walk in hospital room?: A Little ?Help needed climbing 3-5 steps with a railing? : A Lot ?6 Click Score: 19 ? ?  ?End of Session Equipment Utilized During Treatment: Gait belt;Oxygen ?Activity Tolerance: Patient tolerated treatment well ?Patient left: in bed;with call bell/phone within reach;with bed alarm set (waiting on echo) ?Nurse Communication: Mobility status ?PT Visit Diagnosis: Unsteadiness on feet (R26.81);Other abnormalities of gait and mobility (R26.89);Muscle weakness (generalized) (M62.81) ?  ? ? ?Time: 1425-1450 ?PT Time Calculation (min) (ACUTE ONLY): 25 min ? ?Charges:  $Therapeutic Exercise: 23-37 mins          ?          ? ?Vickki Muff, PT, DPT  ? ?Acute Rehabilitation Department ?Pager #: [phone] - [phone] ? ? ?[name] ?07/31/2021, 3:31 PM ? ?

## 2021-07-31 NOTE — Progress Notes (Signed)
? ?Progress Note ? ?Patient Name: [name] ?Date of Encounter: 07/31/2021 ? ?[facility] Cardiologist: Peter Martinique, MD  ? ?Subjective  ? ?Incomplete I/Os.  Mild bump in creatinine (1.59 > 1.71 >1.76).  BP 141/52 this morning.  Reports dyspnea improved but continues to have shortness of breath ? ?Inpatient Medications  ?  ?Scheduled Meds: ? aspirin  81 mg Oral Daily  ? atorvastatin  40 mg Oral Daily  ? timolol  1 drop Left Eye BID  ? And  ? brimonidine  1 drop Left Eye BID  ? carvedilol  6.25 mg Oral BID WC  ? enoxaparin (LOVENOX) injection  30 mg Subcutaneous Q24H  ? furosemide  40 mg Intravenous Q12H  ? hydrALAZINE  50 mg Oral TID  ? latanoprost  1 drop Left Eye QHS  ? sodium chloride flush  3 mL Intravenous Q12H  ? ?Continuous Infusions: ? sodium chloride    ? ?PRN Meds: ?sodium chloride, acetaminophen **OR** acetaminophen, albuterol, sodium chloride flush  ? ?Vital Signs  ?  ?Vitals:  ? 07/30/21 1621 07/30/21 1926 07/31/21 0317 07/31/21 0810  ?BP: (!) 141/89 133/60 (!) 131/58 (!) 141/52  ?Pulse:  74 77   ?Resp:  20 20   ?Temp:  99.7 ?F (37.6 ?C) 99.1 ?F (37.3 ?C)   ?TempSrc:  Oral Oral   ?SpO2:  95% 96%   ?Weight:      ?Height:      ? ? ?Intake/Output Summary (Last 24 hours) at 07/31/2021 1100 ?Last data filed at 07/31/2021 0150 ?Gross per 24 hour  ?Intake --  ?Output 475 ml  ?Net -475 ml  ? ? ? ?  07/30/2021  ?  4:06 AM 07/29/2021  ?  6:21 PM 07/29/2021  ?  1:14 PM  ?Last 3 Weights  ?Weight (lbs) 183 lb 14.4 oz 190 lb 190 lb  ?Weight (kg) 83.416 kg 86.183 kg 86.183 kg  ?   ? ?Telemetry  ?  ?NSR 70-80s - Personally Reviewed ? ?ECG  ?  ?No new ECG- Personally Reviewed ? ?Physical Exam  ? ?GEN: No acute distress.   ?Neck: Mild JVD ?Cardiac: RRR, no murmurs, rubs, or gallops.  ?Respiratory: Scattered expiratory wheezing ?GI: Soft, nontender ?MS: 1+ edema LLE ?Neuro:  Nonfocal  ?Psych: Normal affect  ? ?Labs  ?  ?High Sensitivity Troponin:   ?Recent Labs  ?Lab 07/29/21 ?1255 07/29/21 ?1506 07/29/21 ?1858  07/29/21 ?2239  ?TROPONINIHS 152* 157* 134* 148*  ? ?   ?Chemistry ?Recent Labs  ?Lab 07/29/21 ?1255 07/29/21 ?1858 07/30/21 ?0254 07/31/21 ?[id number]  ?NA 141  --  139 140  ?K 4.2  --  4.0 3.8  ?CL 110  --  109 107  ?CO2 26  --  23 27  ?GLUCOSE 117*  --  111* 100*  ?BUN 29*  --  32* 37*  ?CREATININE 1.59*  --  1.71* 1.76*  ?CALCIUM 8.5*  --  8.3* 8.2*  ?MG  --  2.2 2.2 2.2  ?PROT 5.9*  --   --   --   ?ALBUMIN 2.8*  --   --   --   ?AST 20  --   --   --   ?ALT 12  --   --   --   ?ALKPHOS 67  --   --   --   ?BILITOT 1.2  --   --   --   ?GFRNONAA 40*  --  36* 35*  ?ANIONGAP 5  --  7 6  ? ?  ?  Lipids No results for input(s): CHOL, TRIG, HDL, LABVLDL, LDLCALC, CHOLHDL in the last 168 hours.  ?Hematology ?Recent Labs  ?Lab 07/29/21 ?1255 07/30/21 ?0254  ?WBC 8.2 7.6  ?RBC 3.82* 3.70*  ?HGB 11.0* 10.9*  ?HCT 35.9* 34.0*  ?MCV 94.0 91.9  ?MCH 28.8 29.5  ?MCHC 30.6 32.1  ?RDW 14.7 14.7  ?PLT 165 152  ? ? ?Thyroid  ?Recent Labs  ?Lab 07/29/21 ?1858  ?TSH 3.515  ? ?  ?BNP ?Recent Labs  ?Lab 07/29/21 ?1255  ?BNP 1,087.8*  ? ?  ?DDimer No results for input(s): DDIMER in the last 168 hours.  ? ?Radiology  ?  ?DG Chest Portable 1 View ? ?Result Date: 07/29/2021 ?CLINICAL DATA:  Shortness of breath. EXAM: PORTABLE CHEST 1 VIEW COMPARISON:  April 09, 2021. FINDINGS: Stable cardiomegaly. Status post coronary bypass graft. Bibasilar atelectasis or edema is noted. Bony thorax is unremarkable. IMPRESSION: Mild bibasilar subsegmental atelectasis or edema. Aortic Atherosclerosis (ICD10-I70.0). Electronically Signed   By: Marijo Conception M.D.   On: 07/29/2021 13:15  ? ?ECHOCARDIOGRAM COMPLETE ? ?Result Date: 07/30/2021 ?   ECHOCARDIOGRAM REPORT   Patient Name:   [name] Date of Exam: 07/30/2021 Medical Rec #:  [id number]       Height:       66.0 in Accession #:    [id number]      Weight:       183.9 lb Date of Birth:  Sep 19, 1924       BSA:          1.930 m? Patient Age:    88 years        BP:           145/60 mmHg Patient Gender: M                HR:           71 bpm. Exam Location:  Inpatient Procedure: 2D Echo, Cardiac Doppler and Color Doppler Indications:    elevated troponin  History:        Patient has prior history of Echocardiogram examinations, most                 recent 04/09/2021. CAD, Arrythmias:Atrial Fibrillation; Risk                 Factors:Hypertension and Dyslipidemia. AOV REPLACEMENT.                 Aortic Valve: 23 mm Edwards bovine valve is present in the                 aortic position. Procedure Date: 2009.  Sonographer:    Beryle Beams Referring Phys: [id number] [place]  1. Left ventricular ejection fraction, by estimation, is 50 to 55%. The left ventricle has low normal function. The left ventricle has no regional wall motion abnormalities. There is moderate concentric left ventricular hypertrophy. Left ventricular diastolic parameters are indeterminate.  2. Right ventricular systolic function is moderately reduced. The right ventricular size is moderately enlarged. There is normal pulmonary artery systolic pressure.  3. Left atrial size was severely dilated.  4. Right atrial size was mildly dilated.  5. The mitral valve is grossly normal. Mild mitral valve regurgitation. No evidence of mitral stenosis. Severe mitral annular calcification.  6. The aortic valve has been repaired/replaced. Aortic valve regurgitation is not visualized. There is a 23 mm Edwards bovine valve present in the aortic position. Procedure Date: 2009. Echo findings are consistent  with normal structure and function of the aortic valve prosthesis. Aortic valve mean gradient measures 9.0 mmHg.  7. The inferior vena cava is dilated in size with >50% respiratory variability, suggesting right atrial pressure of 8 mmHg. Comparison(s): No significant change from prior study. Conclusion(s)/Recommendation(s): Otherwise normal echocardiogram, with minor abnormalities described in the report. FINDINGS  Left Ventricle: Left ventricular ejection  fraction, by estimation, is 50 to 55%. The left ventricle has low normal function. The left ventricle has no regional wall motion abnormalities. The left ventricular internal cavity size was normal in size. There is moderate concentric left ventricular hypertrophy. Left ventricular diastolic parameters are indeterminate. Right Ventricle: The right ventricular size is moderately enlarged. Right vetricular wall thickness was not well visualized. Right ventricular systolic function is moderately reduced. There is normal pulmonary artery systolic pressure. The tricuspid regurgitant velocity is 1.74 m/s, and with an assumed right atrial pressure of 8 mmHg, the estimated right ventricular systolic pressure is [id number]. Left Atrium: Left atrial size was severely dilated. Right Atrium: Right atrial size was mildly dilated. Pericardium: There is no evidence of pericardial effusion. Mitral Valve: The mitral valve is grossly normal. Severe mitral annular calcification. Mild mitral valve regurgitation. No evidence of mitral valve stenosis. Tricuspid Valve: The tricuspid valve is grossly normal. Tricuspid valve regurgitation is trivial. No evidence of tricuspid stenosis. Aortic Valve: The aortic valve has been repaired/replaced. Aortic valve regurgitation is not visualized. Aortic valve mean gradient measures 9.0 mmHg. Aortic valve peak gradient measures 16.3 mmHg. Aortic valve area, by VTI measures 1.14 cm?Marland Kitchen There is a 23 mm Edwards bovine valve present in the aortic position. Procedure Date: 2009. Echo findings are consistent with normal structure and function of the aortic valve prosthesis. Pulmonic Valve: The pulmonic valve was not well visualized. Pulmonic valve regurgitation is not visualized. No evidence of pulmonic stenosis. Aorta: The aortic root and ascending aorta are structurally normal, with no evidence of dilitation. Venous: The inferior vena cava is dilated in size with greater than 50% respiratory variability,  suggesting right atrial pressure of 8 mmHg. IAS/Shunts: The interatrial septum was not well visualized.  LEFT VENTRICLE PLAX 2D LVIDd:         4.70 cm LVIDs:         3.40 cm LV PW:         1.50 cm LV IVS:        1.60

## 2021-08-01 DIAGNOSIS — I248 Other forms of acute ischemic heart disease: Secondary | ICD-10-CM

## 2021-08-01 LAB — BASIC METABOLIC PANEL
Anion gap: 9 (ref 5–15)
BUN: 44 mg/dL — ABNORMAL HIGH (ref 8–23)
CO2: 26 mmol/L (ref 22–32)
Calcium: 8.1 mg/dL — ABNORMAL LOW (ref 8.9–10.3)
Chloride: 104 mmol/L (ref 98–111)
Creatinine, Ser: 1.74 mg/dL — ABNORMAL HIGH (ref 0.61–1.24)
GFR, Estimated: 36 mL/min — ABNORMAL LOW (ref >60)
Glucose, Bld: 103 mg/dL — ABNORMAL HIGH (ref 70–99)
Potassium: 3.8 mmol/L (ref 3.5–5.1)
Sodium: 139 mmol/L (ref 135–145)

## 2021-08-01 LAB — MAGNESIUM: Magnesium: 2.2 mg/dL (ref 1.7–2.4)

## 2021-08-01 MED ORDER — FUROSEMIDE 40 MG PO TABS
40.0000 mg | ORAL_TABLET | Freq: Every day | ORAL | Status: DC
  Administered 2021-08-02: 40 mg via ORAL
  Filled 2021-08-01: qty 1

## 2021-08-01 NOTE — Progress Notes (Signed)
?  Progress Note ? ? ?Patient: [name] [id number] DOB: 13-Jan-1925 DOA: 07/29/2021     2 ?DOS: the patient was seen and examined on 08/01/2021 ?  ?Brief hospital course: ?88 y/o male admitted to the hospital with generalized weakness and dyspnea on exertion. Found to have decompensated CHF. On IV lasix. Also had elevated troponin, felt to be demand ischemia. Cardiology following.  ? ?Assessment and Plan: ?Acute on chronic diastolic CHF (congestive heart failure) ([place]) ?Shortness of breath appears to be improving.  Appears to be able to walk further distances. ?Cardiology following ?Continuing on IV lasix 40mg  bid for now ?Monitor intake and output/daily weights ?Echo shows preserved EF ?Coreg and hydralazine ? ? ?Elevated troponin ?125>157 ?No chest pain or changes on ekg ?Suspected demand ischemia in setting of decompensated CHF ?Echo shows preserved EF with no WMA ?Cardiology following ? ?Coronary artery disease ?History of CABG with revision and AVR in 2009.  ?No chest pain or changes on ekg ?Continue ASA/statin/coreg  ? ?Essential hypertension ?Has had orthostasis/low bp at home with higher doses of medication ?[id number] systolic goal ?Continue coreg/hydralazine.  ?BPs have been stable ? ?CKD (chronic kidney disease), stage III ([place]) ?Baseline creatinine around 1.3-1.5 ?Mild uptrend in creatinine, likely related to lasix ?Continue to monitor with diuresis  ?Would advise to stop using NSAIDs.  ? ?Hyperlipidemia ?Continue lipitor  ? ?Status post aortic valve replacement with tissue ?Status post aortic valve replacement with a #23 mm pericardial tissue valve in January of 2009. ?Echo shows stable valve function ? ?Weakness ?Likely multifactorial ?Seen by PT/OT with recommendations for [facility] ?Overall exercise tolerance appears to be improving ?He was able to ambulate a little over 300 feet today. ? ?Normocytic anemia ?Likely ACD ?Baseline hgb: 11-12 ?Hemoglobin currently near baseline ? ? ? ? ?  ? ?Subjective: He is  feeling better.  He is able to ambulate further.  Still gets tired on ambulation.  Overall shortness of breath improving with ambulation. ? ?Physical Exam: ?Vitals:  ? 08/01/21 0621 08/01/21 0800 08/01/21 0851 08/01/21 1854  ?BP: (!) 121/58 117/67 117/67 139/70  ?Pulse: 70  76 76  ?Resp: 18 16  20   ?Temp: 98.8 ?F (37.1 ?C) 98.4 ?F (36.9 ?C)  99.6 ?F (37.6 ?C)  ?TempSrc: Oral Oral  Axillary  ?SpO2: 96% 93% 95% 94%  ?Weight: 82.6 kg     ?Height:      ? ?General exam: Alert, awake, oriented x 3 ?Respiratory system: Crackles at bases bilaterally. Respiratory effort normal. ?Cardiovascular system:RRR. No murmurs, rubs, gallops. ?Gastrointestinal system: Abdomen is nondistended, soft and nontender. No organomegaly or masses felt. Normal bowel sounds heard. ?Central nervous system: Alert and oriented. No focal neurological deficits. ?Extremities: No C/C/E, +pedal pulses ?Skin: No rashes, lesions or ulcers ?Psychiatry: Judgement and insight appear normal. Mood & affect appropriate.  ? ?Data Reviewed: ? ?Reviewed chemistry, creatinine appears to be stable ? ?Family Communication: Discussed with son at the bedside ? ?Disposition: ?Status is: Inpatient ?Remains inpatient appropriate because: Continued IV diuresis ? Planned Discharge Destination: Home with Home Health ? ? ? ?Time spent: 35 minutes ? ?Author: ?Kathie Dike, MD ?08/01/2021 7:22 PM ? ?For on call review [email or web address].  ?

## 2021-08-01 NOTE — Progress Notes (Signed)
Mobility Specialist Progress Note  ? ? 08/01/21 1343  ?Mobility  ?Activity Ambulated with assistance in hallway  ?Level of Assistance Contact guard assist, steadying assist  ?Assistive Device Front wheel walker  ?Distance Ambulated (ft) 320 ft  ?Activity Response Tolerated well  ?$Mobility charge 1 Mobility  ? ?Pre-Mobility: 74 HR ?Post-Mobility: 98% SpO2 ? ?Pt received standing at window and agreeable. No complaints. Ambulated on 2LO2. Returned to bed with call bell in reach.  ? ?[facility] [facility] ?Mobility Specialist  ?  ?

## 2021-08-01 NOTE — Progress Notes (Signed)
Occupational Therapy Treatment ?Patient Details ?Name: [name] ?MRN: [id number] ?DOB: 05-27-1924 ?Today's Date: 08/01/2021 ? ? ?History of present illness The pt is a 88 yo male presenting 3/30 with SOB and weakness, suspect acute CHF exacerbation. PMH includes: CAD s/p CABG and CABG revision with AVR, HTN, HLD, CKD III, and peptic ulcer disease. ?  ?OT comments ? Patient received in recliner and agreeable to OT treatment and asking to use bathroom.  Patient was able to get to EOB without assistance and required increased time before attempting to ambulate to bathroom. Patient was min guard for tranfers, mobility, clothing management with toilet transfers, and standing at sink.  Patient is motivated and making good gains with OT treatment. Acute OT to continue to follow.   ? ?Recommendations for follow up therapy are one component of a multi-disciplinary discharge planning process, led by the attending physician.  Recommendations may be updated based on patient status, additional functional criteria and insurance authorization. ?   ?Follow Up Recommendations ? Home health OT  ?  ?Assistance Recommended at Discharge Frequent or constant Supervision/Assistance  ?Patient can return home with the following ? A little help with walking and/or transfers;A little help with bathing/dressing/bathroom ?  ?Equipment Recommendations ? None recommended by OT  ?  ?Recommendations for Other Services   ? ?  ?Precautions / Restrictions Precautions ?Precautions: Fall ?Precaution Comments: watch O2, pt on 1L this session ?Restrictions ?Weight Bearing Restrictions: No  ? ? ?  ? ?Mobility Bed Mobility ?Overal bed mobility: Needs Assistance ?Bed Mobility: Supine to Sit ?  ?  ?Supine to sit: Min guard ?  ?  ?General bed mobility comments: no assistance, patient used rails to assist ?  ? ?Transfers ?Overall transfer level: Needs assistance ?Equipment used: Rolling walker (2 wheels) ?Transfers: Sit to/from Stand ?Sit to Stand: Min  guard ?  ?  ?  ?  ?  ?General transfer comment: cues for hand placement and min guard for safety ?  ?  ?Balance Overall balance assessment: Needs assistance ?Sitting-balance support: No upper extremity supported ?Sitting balance-Leahy Scale: Good ?  ?  ?Standing balance support: Single extremity supported, Bilateral upper extremity supported, No upper extremity supported ?Standing balance-Leahy Scale: Poor ?Standing balance comment: able to stand at sink for grooming tasks ?  ?  ?  ?  ?  ?  ?  ?  ?  ?  ?  ?   ? ?ADL either performed or assessed with clinical judgement  ? ?ADL Overall ADL's : Needs assistance/impaired ?  ?  ?Grooming: Wash/dry hands;Wash/dry face;Brushing hair;Min guard;Standing ?Grooming Details (indicate cue type and reason): at sink ?  ?  ?  ?  ?  ?  ?  ?  ?Toilet Transfer: Min guard;Ambulation;Regular Toilet ?Toilet Transfer Details (indicate cue type and reason): min guard for safety ?Toileting- Clothing Manipulation and Hygiene: Supervision/safety;Sit to/from stand;Min guard ?Toileting - Clothing Manipulation Details (indicate cue type and reason): performed toilet hygiene seated with supervision, min guard for clothing management ?  ?  ?  ?General ADL Comments: min guard and verbal cues for saety ?  ? ?Extremity/Trunk Assessment   ?  ?  ?  ?  ?  ? ?Vision   ?  ?  ?Perception   ?  ?Praxis   ?  ? ?Cognition Arousal/Alertness: Awake/alert ?Behavior During Therapy: [facility] for tasks assessed/performed ?Overall Cognitive Status: No family/caregiver present to determine baseline cognitive functioning ?  ?  ?  ?  ?  ?  ?  ?  ?  ?  ?  ?  ?  ?  ?  ?  ?  General Comments: cues for safety, very talkative ?  ?  ?   ?Exercises   ? ?  ?Shoulder Instructions   ? ? ?  ?General Comments    ? ? ?Pertinent Vitals/ Pain       Pain Assessment ?Pain Assessment: No/denies pain ? ?Home Living   ?  ?  ?  ?  ?  ?  ?  ?  ?  ?  ?  ?  ?  ?  ?  ?  ?  ?  ? ?  ?Prior Functioning/Environment    ?  ?  ?  ?   ? ?Frequency ? Min  2X/week  ? ? ? ? ?  ?Progress Toward Goals ? ?OT Goals(current goals can now be found in the care plan section) ? Progress towards OT goals: Progressing toward goals ? ?Acute Rehab OT Goals ?Patient Stated Goal: get better ?OT Goal Formulation: With patient ?Time For Goal Achievement: 08/13/21 ?Potential to Achieve Goals: Good ?ADL Goals ?Pt Will Perform Grooming: with modified independence;standing ?Pt Will Perform Upper Body Dressing: with modified independence;standing ?Pt Will Transfer to Toilet: with modified independence;ambulating;regular height toilet ?Pt Will Perform Toileting - Clothing Manipulation and hygiene: with modified independence;sit to/from stand;sitting/lateral leans  ?Plan Discharge plan remains appropriate   ? ?Co-evaluation ? ? ?   ?  ?  ?  ?  ? ?  ?AM-PAC OT "6 Clicks" Daily Activity     ?Outcome Measure ? ? Help from another person eating meals?: A Little ?Help from another person taking care of personal grooming?: A Little ?Help from another person toileting, which includes using toliet, bedpan, or urinal?: A Lot ?Help from another person bathing (including washing, rinsing, drying)?: A Lot ?Help from another person to put on and taking off regular upper body clothing?: A Little ?Help from another person to put on and taking off regular lower body clothing?: A Lot ?6 Click Score: 15 ? ?  ?End of Session Equipment Utilized During Treatment: Rolling walker (2 wheels);Oxygen (2 liters) ? ?OT Visit Diagnosis: Unsteadiness on feet (R26.81);Other abnormalities of gait and mobility (R26.89);Muscle weakness (generalized) (M62.81) ?  ?Activity Tolerance Patient tolerated treatment well ?  ?Patient Left in chair;with call bell/phone within reach;with chair alarm set;with family/visitor present ?  ?Nurse Communication Mobility status ?  ? ?   ? ?Time: 0354-6568 ?OT Time Calculation (min): 41 min ? ?Charges: OT General Charges ?$OT Visit: 1 Visit ?OT Treatments ?$Self Care/Home Management : 23-37  mins ?$Therapeutic Activity: 8-22 mins ? ?Alfonse Flavors, OTA ?Acute Rehabilitation Services  ?Pager [phone] ?Office [phone] ? ? ?Dewain Penning ?08/01/2021, 11:31 AM ?

## 2021-08-01 NOTE — Progress Notes (Signed)
? ?Progress Note ? ?Patient Name: [name] ?Date of Encounter: 08/01/2021 ? ?[facility] Cardiologist: Peter Martinique, MD  ? ?Subjective  ? ?Incomplete I/Os.  Stable creatinine (1.59 > 1.71 >1.76 >1.74).  BP 117/67 this morning.  Weight is down 8 pounds from admission.  Reports dyspnea is improved ? ?Inpatient Medications  ?  ?Scheduled Meds: ? amLODipine  2.5 mg Oral Daily  ? aspirin  81 mg Oral Daily  ? atorvastatin  40 mg Oral Daily  ? timolol  1 drop Left Eye BID  ? And  ? brimonidine  1 drop Left Eye BID  ? carvedilol  6.25 mg Oral BID WC  ? enoxaparin (LOVENOX) injection  30 mg Subcutaneous Q24H  ? furosemide  40 mg Intravenous Q12H  ? hydrALAZINE  50 mg Oral TID  ? latanoprost  1 drop Left Eye QHS  ? sodium chloride flush  3 mL Intravenous Q12H  ? ?Continuous Infusions: ? sodium chloride    ? ?PRN Meds: ?sodium chloride, acetaminophen **OR** acetaminophen, albuterol, sodium chloride flush  ? ?Vital Signs  ?  ?Vitals:  ? 08/01/21 0000 08/01/21 0621 08/01/21 0800 08/01/21 0851  ?BP: (!) 120/48 (!) 121/58 117/67 117/67  ?Pulse: 79 70  76  ?Resp: 19 18 16    ?Temp: 99.3 ?F (37.4 ?C) 98.8 ?F (37.1 ?C) 98.4 ?F (36.9 ?C)   ?TempSrc: Oral Oral Oral   ?SpO2: 97% 96% 93% 95%  ?Weight:  82.6 kg    ?Height:      ? ? ?Intake/Output Summary (Last 24 hours) at 08/01/2021 1129 ?Last data filed at 08/01/2021 0900 ?Gross per 24 hour  ?Intake 358 ml  ?Output 850 ml  ?Net -492 ml  ? ? ? ?  08/01/2021  ?  6:21 AM 07/30/2021  ?  4:06 AM 07/29/2021  ?  6:21 PM  ?Last 3 Weights  ?Weight (lbs) 182 lb 183 lb 14.4 oz 190 lb  ?Weight (kg) 82.555 kg 83.416 kg 86.183 kg  ?   ? ?Telemetry  ?  ?NSR 70s, NSVT x 17 beats- Personally Reviewed ? ?ECG  ?  ?No new ECG- Personally Reviewed ? ?Physical Exam  ? ?GEN: No acute distress.   ?Neck: Mild JVD ?Cardiac: RRR, no murmurs, rubs, or gallops.  ?Respiratory: Scattered expiratory wheezing ?GI: Soft, nontender ?MS: 1+ edema LLE ?Neuro:  Nonfocal  ?Psych: Normal affect  ? ?Labs  ?  ?High Sensitivity  Troponin:   ?Recent Labs  ?Lab 07/29/21 ?1255 07/29/21 ?1506 07/29/21 ?1858 07/29/21 ?2239  ?TROPONINIHS 152* 157* 134* 148*  ? ?   ?Chemistry ?Recent Labs  ?Lab 07/29/21 ?1255 07/29/21 ?1858 07/30/21 ?0254 07/31/21 ?[id number] 08/01/21 ?0305  ?NA 141  --  139 140 139  ?K 4.2  --  4.0 3.8 3.8  ?CL 110  --  109 107 104  ?CO2 26  --  23 27 26   ?GLUCOSE 117*  --  111* 100* 103*  ?BUN 29*  --  32* 37* 44*  ?CREATININE 1.59*  --  1.71* 1.76* 1.74*  ?CALCIUM 8.5*  --  8.3* 8.2* 8.1*  ?MG  --    < > 2.2 2.2 2.2  ?PROT 5.9*  --   --   --   --   ?ALBUMIN 2.8*  --   --   --   --   ?AST 20  --   --   --   --   ?ALT 12  --   --   --   --   ?  ALKPHOS 67  --   --   --   --   ?BILITOT 1.2  --   --   --   --   ?GFRNONAA 40*  --  36* 35* 36*  ?ANIONGAP 5  --  7 6 9   ? < > = values in this interval not displayed.  ? ?  ?Lipids No results for input(s): CHOL, TRIG, HDL, LABVLDL, LDLCALC, CHOLHDL in the last 168 hours.  ?Hematology ?Recent Labs  ?Lab 07/29/21 ?1255 07/30/21 ?0254  ?WBC 8.2 7.6  ?RBC 3.82* 3.70*  ?HGB 11.0* 10.9*  ?HCT 35.9* 34.0*  ?MCV 94.0 91.9  ?MCH 28.8 29.5  ?MCHC 30.6 32.1  ?RDW 14.7 14.7  ?PLT 165 152  ? ? ?Thyroid  ?Recent Labs  ?Lab 07/29/21 ?1858  ?TSH 3.515  ? ?  ?BNP ?Recent Labs  ?Lab 07/29/21 ?1255  ?BNP 1,087.8*  ? ?  ?DDimer No results for input(s): DDIMER in the last 168 hours.  ? ?Radiology  ?  ?ECHOCARDIOGRAM COMPLETE ? ?Result Date: 07/30/2021 ?   ECHOCARDIOGRAM REPORT   Patient Name:   [name] Date of Exam: 07/30/2021 Medical Rec #:  [id number]       Height:       66.0 in Accession #:    [id number]      Weight:       183.9 lb Date of Birth:  10-02-1924       BSA:          1.930 m? Patient Age:    88 years        BP:           145/60 mmHg Patient Gender: M               HR:           71 bpm. Exam Location:  Inpatient Procedure: 2D Echo, Cardiac Doppler and Color Doppler Indications:    elevated troponin  History:        Patient has prior history of Echocardiogram examinations, most                 recent  04/09/2021. CAD, Arrythmias:Atrial Fibrillation; Risk                 Factors:Hypertension and Dyslipidemia. AOV REPLACEMENT.                 Aortic Valve: 23 mm Edwards bovine valve is present in the                 aortic position. Procedure Date: 2009.  Sonographer:    Beryle Beams Referring Phys: [id number] [place]  1. Left ventricular ejection fraction, by estimation, is 50 to 55%. The left ventricle has low normal function. The left ventricle has no regional wall motion abnormalities. There is moderate concentric left ventricular hypertrophy. Left ventricular diastolic parameters are indeterminate.  2. Right ventricular systolic function is moderately reduced. The right ventricular size is moderately enlarged. There is normal pulmonary artery systolic pressure.  3. Left atrial size was severely dilated.  4. Right atrial size was mildly dilated.  5. The mitral valve is grossly normal. Mild mitral valve regurgitation. No evidence of mitral stenosis. Severe mitral annular calcification.  6. The aortic valve has been repaired/replaced. Aortic valve regurgitation is not visualized. There is a 23 mm Edwards bovine valve present in the aortic position. Procedure Date: 2009. Echo findings are consistent with normal structure and function of the aortic  valve prosthesis. Aortic valve mean gradient measures 9.0 mmHg.  7. The inferior vena cava is dilated in size with >50% respiratory variability, suggesting right atrial pressure of 8 mmHg. Comparison(s): No significant change from prior study. Conclusion(s)/Recommendation(s): Otherwise normal echocardiogram, with minor abnormalities described in the report. FINDINGS  Left Ventricle: Left ventricular ejection fraction, by estimation, is 50 to 55%. The left ventricle has low normal function. The left ventricle has no regional wall motion abnormalities. The left ventricular internal cavity size was normal in size. There is moderate concentric left ventricular  hypertrophy. Left ventricular diastolic parameters are indeterminate. Right Ventricle: The right ventricular size is moderately enlarged. Right vetricular wall thickness was not well visualized. Right ventricular systolic function is moderately reduced. There is normal pulmonary artery systolic pressure. The tricuspid regurgitant velocity is 1.74 m/s, and with an assumed right atrial pressure of 8 mmHg, the estimated right ventricular systolic pressure is [id number]. Left Atrium: Left atrial size was severely dilated. Right Atrium: Right atrial size was mildly dilated. Pericardium: There is no evidence of pericardial effusion. Mitral Valve: The mitral valve is grossly normal. Severe mitral annular calcification. Mild mitral valve regurgitation. No evidence of mitral valve stenosis. Tricuspid Valve: The tricuspid valve is grossly normal. Tricuspid valve regurgitation is trivial. No evidence of tricuspid stenosis. Aortic Valve: The aortic valve has been repaired/replaced. Aortic valve regurgitation is not visualized. Aortic valve mean gradient measures 9.0 mmHg. Aortic valve peak gradient measures 16.3 mmHg. Aortic valve area, by VTI measures 1.14 cm?Marland Kitchen There is a 23 mm Edwards bovine valve present in the aortic position. Procedure Date: 2009. Echo findings are consistent with normal structure and function of the aortic valve prosthesis. Pulmonic Valve: The pulmonic valve was not well visualized. Pulmonic valve regurgitation is not visualized. No evidence of pulmonic stenosis. Aorta: The aortic root and ascending aorta are structurally normal, with no evidence of dilitation. Venous: The inferior vena cava is dilated in size with greater than 50% respiratory variability, suggesting right atrial pressure of 8 mmHg. IAS/Shunts: The interatrial septum was not well visualized.  LEFT VENTRICLE PLAX 2D LVIDd:         4.70 cm LVIDs:         3.40 cm LV PW:         1.50 cm LV IVS:        1.60 cm LVOT diam:     1.80 cm LV SV:          52 LV SV Index:   27 LVOT Area:     2.54 cm?  LV Volumes (MOD) LV vol d, MOD A2C: 54.7 ml LV vol d, MOD A4C: 47.2 ml LV vol s, MOD A2C: 31.3 ml LV vol s, MOD A4C: 25.2 ml LV SV MOD A2C:     23.4

## 2021-08-02 DIAGNOSIS — I5031 Acute diastolic (congestive) heart failure: Secondary | ICD-10-CM

## 2021-08-02 LAB — BASIC METABOLIC PANEL
Anion gap: 7 (ref 5–15)
BUN: 47 mg/dL — ABNORMAL HIGH (ref 8–23)
CO2: 24 mmol/L (ref 22–32)
Calcium: 8 mg/dL — ABNORMAL LOW (ref 8.9–10.3)
Chloride: 103 mmol/L (ref 98–111)
Creatinine, Ser: 1.85 mg/dL — ABNORMAL HIGH (ref 0.61–1.24)
GFR, Estimated: 33 mL/min — ABNORMAL LOW (ref >60)
Glucose, Bld: 100 mg/dL — ABNORMAL HIGH (ref 70–99)
Potassium: 3.7 mmol/L (ref 3.5–5.1)
Sodium: 134 mmol/L — ABNORMAL LOW (ref 135–145)

## 2021-08-02 MED ORDER — POTASSIUM CHLORIDE CRYS ER 20 MEQ PO TBCR
20.0000 meq | EXTENDED_RELEASE_TABLET | Freq: Once | ORAL | Status: AC
  Administered 2021-08-02: 20 meq via ORAL
  Filled 2021-08-02: qty 1

## 2021-08-02 NOTE — Progress Notes (Addendum)
? ?Progress Note ? ?Patient Name: [name] ?Date of Encounter: 08/02/2021 ? ?[facility] Cardiologist: Peter Martinique, MD  ? ?Subjective  ? ?Patient is sitting at the bedside, drinking coffee this morning. He states his hearing isn't so great. He states he feels a lot better than initial day when he was admitted. He denied any chest pain, SOB. He states he did some walking yesterday and felt winded. He is not sure if he is urinating more or less.  ? ?Inpatient Medications  ?  ?Scheduled Meds: ? amLODipine  2.5 mg Oral Daily  ? aspirin  81 mg Oral Daily  ? atorvastatin  40 mg Oral Daily  ? timolol  1 drop Left Eye BID  ? And  ? brimonidine  1 drop Left Eye BID  ? carvedilol  6.25 mg Oral BID WC  ? enoxaparin (LOVENOX) injection  30 mg Subcutaneous Q24H  ? furosemide  40 mg Oral Daily  ? hydrALAZINE  50 mg Oral TID  ? latanoprost  1 drop Left Eye QHS  ? potassium chloride  20 mEq Oral Once  ? sodium chloride flush  3 mL Intravenous Q12H  ? ?Continuous Infusions: ? sodium chloride    ? ?PRN Meds: ?sodium chloride, acetaminophen **OR** acetaminophen, albuterol, sodium chloride flush  ? ?Vital Signs  ?  ?Vitals:  ? 08/01/21 0851 08/01/21 1854 08/01/21 2012 08/02/21 0641  ?BP: 117/67 139/70 (!) 120/52 (!) 136/53  ?Pulse: 76 76 78 81  ?Resp:  20 18 15   ?Temp:  99.6 ?F (37.6 ?C) 98.8 ?F (37.1 ?C) 99.7 ?F (37.6 ?C)  ?TempSrc:  Axillary Oral Oral  ?SpO2: 95% 94% 96% 96%  ?Weight:    82.7 kg  ?Height:      ? ? ?Intake/Output Summary (Last 24 hours) at 08/02/2021 0934 ?Last data filed at 08/01/2021 2123 ?Gross per 24 hour  ?Intake 580 ml  ?Output --  ?Net 580 ml  ? ? ?  08/02/2021  ?  6:41 AM 08/01/2021  ?  6:21 AM 07/30/2021  ?  4:06 AM  ?Last 3 Weights  ?Weight (lbs) 182 lb 4.8 oz 182 lb 183 lb 14.4 oz  ?Weight (kg) 82.691 kg 82.555 kg 83.416 kg  ?   ? ?Telemetry  ?  ?NSR 70-80s, last NSVT occurred 08/01/21 - Personally Reviewed ? ?ECG  ?  ?No new EKG today - Personally Reviewed ? ?Physical Exam  ? ?GEN: No acute distress.    ?Neck: No JVD ?Cardiac: RRR, soft murmurs noted RUSB ?Respiratory: Fine crackles noted bilaterally  ?GI: Soft, nontender ?MS: BLE trace edema  ?Neuro:  Alert and oriented x3, HOH, able to communicate needs, moving all extremity freely  ?Psych: Normal affect  ? ?Labs  ?  ?High Sensitivity Troponin:   ?Recent Labs  ?Lab 07/29/21 ?1255 07/29/21 ?1506 07/29/21 ?1858 07/29/21 ?2239  ?TROPONINIHS 152* 157* 134* 148*  ?   ?Chemistry ?Recent Labs  ?Lab 07/29/21 ?1255 07/29/21 ?1858 07/30/21 ?[id number] 07/31/21 ?[id number] 08/01/21 ?[id number] 08/02/21 ?[id number]  ?NA 141  --  139 140 139 134*  ?K 4.2  --  4.0 3.8 3.8 3.7  ?CL 110  --  109 107 104 103  ?CO2 26  --  23 27 26 24   ?GLUCOSE 117*  --  111* 100* 103* 100*  ?BUN 29*  --  32* 37* 44* 47*  ?CREATININE 1.59*  --  1.71* 1.76* 1.74* 1.85*  ?CALCIUM 8.5*  --  8.3* 8.2* 8.1* 8.0*  ?MG  --    < >  2.2 2.2 2.2  --   ?PROT 5.9*  --   --   --   --   --   ?ALBUMIN 2.8*  --   --   --   --   --   ?AST 20  --   --   --   --   --   ?ALT 12  --   --   --   --   --   ?ALKPHOS 67  --   --   --   --   --   ?BILITOT 1.2  --   --   --   --   --   ?GFRNONAA 40*  --  36* 35* 36* 33*  ?ANIONGAP 5  --  7 6 9 7   ? < > = values in this interval not displayed.  ?  ?Lipids No results for input(s): CHOL, TRIG, HDL, LABVLDL, LDLCALC, CHOLHDL in the last 168 hours.  ?Hematology ?Recent Labs  ?Lab 07/29/21 ?1255 07/30/21 ?0254  ?WBC 8.2 7.6  ?RBC 3.82* 3.70*  ?HGB 11.0* 10.9*  ?HCT 35.9* 34.0*  ?MCV 94.0 91.9  ?MCH 28.8 29.5  ?MCHC 30.6 32.1  ?RDW 14.7 14.7  ?PLT 165 152  ? ?Thyroid  ?Recent Labs  ?Lab 07/29/21 ?1858  ?TSH 3.515  ?  ?BNP ?Recent Labs  ?Lab 07/29/21 ?1255  ?BNP 1,087.8*  ?  ?DDimer No results for input(s): DDIMER in the last 168 hours.  ? ?Radiology  ?  ?No results found. ? ?Cardiac Studies  ? ?TTE 04/09/21: ? 1. Left ventricular ejection fraction, by estimation, is 50 to 55%. The  ?left ventricle has low normal function. The left ventricle has no regional  ?wall motion abnormalities. There is mild left  ventricular hypertrophy.  ?Left ventricular diastolic parameters are consistent with Grade I diastolic dysfunction (impaired  ?relaxation).  ? 2. Right ventricular systolic function is mildly reduced. The right  ?ventricular size is normal. There is normal pulmonary artery systolic  ?pressure.  ? 3. Left atrial size was severely dilated.  ? 4. Right atrial size was moderately dilated.  ? 5. The mitral valve is grossly normal. Mild mitral valve regurgitation.  ? 6. Prosthetic valve is well seated, no paravalvular leak. AV max 2.4 m/s.  ?The aortic valve is grossly normal. Aortic valve regurgitation is not  ?visualized. No aortic stenosis is present. There is a 23 mm Edwards bovine  ?valve present in the aortic position. Procedure Date: 2009.  ? 7. The inferior vena cava is normal in size with greater than 50%  ?respiratory variability, suggesting right atrial pressure of 3 mmHg.  ?  ?TTE 07/30/21: ? 1. Left ventricular ejection fraction, by estimation, is 50 to 55%. The  ?left ventricle has low normal function. The left ventricle has no regional  ?wall motion abnormalities. There is moderate concentric left ventricular  ?hypertrophy. Left ventricular diastolic parameters are indeterminate.  ? 2. Right ventricular systolic function is moderately reduced. The right  ?ventricular size is moderately enlarged. There is normal pulmonary artery  ?systolic pressure.  ? 3. Left atrial size was severely dilated.  ? 4. Right atrial size was mildly dilated.  ? 5. The mitral valve is grossly normal. Mild mitral valve regurgitation.  ?No evidence of mitral stenosis. Severe mitral annular calcification.  ? 6. The aortic valve has been repaired/replaced. Aortic valve  ?regurgitation is not visualized. There is a 23 mm Edwards bovine valve  ?present in the aortic position. Procedure Date: 2009. Echo  findings are  ?consistent with normal structure and function of  ?the aortic valve prosthesis. Aortic valve mean gradient measures 9.0  mmHg.  ? 7. The inferior vena cava is dilated in size with >50% respiratory  ?variability, suggesting right atrial pressure of 8 mmHg. ? ?Patient Profile  ?   ?88 y.o. male with PMH of CAD s/p CABG [id number], diastolic CHF, aortic stenosis s/p AVR 2008,  HTN, CKD III, HLD, anemia, mild bilateral ICA stenosis, paroxysmal A-fib, PVD, who presented with weakness and DOE, subsequently admitted for acute decompensated diastolic heart failure.  Cardiology has been consulted and following since 07/29/2021.  ? ?Assessment & Plan  ?  ?Acute on chronic diastolic heart failure ?- Presented with weakness and DOE ?- Hs Trop 152>157 ?- BNP 1087 ?- EKG showed sinus rhythm, LVH  ?- CXR showed mild bibasilar segmental atelectasis or edema  ?- Echo 07/30/21 showed EF 50-55%, no RMWA, indeterminate diastolic function, RV systolic moderately reduced, RV moderately enlarged, LA severely dilated, RA mildly dilated, mild MR, normal structure and function of the aortic valve prosthesis. ?- Net - 947 (not accurate recorded I&O), weight 190 ib > 182, clinically improving CHF symptoms  ?- s/p IV Lasix 40mg  BID, transitioned to PO Lasix 40mg  daily today, renal index stable ?- GDMT: continue coreg 6.25mg  BID, hydralazine 50mg  TID, may add Imdur if BP allows, no ACE/ARB due to CKD  ?- low Na2g diet, monitor I&O and daily weight, keep K >4 and Mag >2  ?- if continue diuresis well, may DC on PO lasix, follow up with cardiology in 2-3 weeks outpatient  ? ?Elevated Troponin ?CAD s/p CABG 2008 ?- Hs trop 152>>157 >134 >148, no acute ischemic EKG changes, no chest pain this admission ?- likely due to CKD and decompensated heart failure ?- Continue medical therapy with ASA 81mg , lipitor 40mg , and coreg 6.25mg  BID  ? ?Aortic Stenosis s/p aortic valve replacement ?- Prosthetic valve normal functioning on TTE from 07/30/2021 ? ?NSVT ?- resolved since 08/01/21, maintain K >4 and Mag >2, will supplement K today ? ?HTN ?- BP controlled at this time with coreg,  hydralazine, norvasc, lasix ? ?HLD ?- continue statin  ? ?CKD III ?- renal index overall stable with lasix diuresis , baseline Cr appears 1.5-1.7 over the past year  ? ? ? ?For questions or updates, please contact C

## 2021-08-02 NOTE — TOC Initial Note (Signed)
Transition of Care (TOC) - Initial/Assessment Note  ? ? ?Patient Details  ?Name: [name] ?MRN: [id number] ?Date of Birth: June 08, 1924 ? ?Transition of Care (TOC) CM/SW Contact:    ?Graves-Bigelow, Ocie Cornfield, RN ?Phone Number: ?08/02/2021, 3:10 PM ? ?Clinical Narrative: Case Manager spoke with the patient regarding home health services. Patient states he does not have a preference for agency. Case Manager called [facility]- both unable to accept. Case Manager then called [facility] and the office can accept the patient. Start of care to begin within 24-48 hours post transition home. No equipment needs identified at this time. Patient has transportation home. No further needs from Case Manager.              ? ? ?Expected Discharge Plan: [place] ?Barriers to Discharge: No Barriers Identified ? ? ?Patient Goals and CMS Choice ?Patient states their goals for this hospitalization and ongoing recovery are:: to return home with home health ?  ?Choice offered to / list presented to : Patient ? ?Expected Discharge Plan and Services ?Expected Discharge Plan: [place] ?  ?Discharge Planning Services: CM Consult ?Post Acute Care Choice: Home Health ?Living arrangements for the past 2 months: [place] ?Expected Discharge Date: 08/02/21               ?DME Arranged: N/A ?DME Agency: NA ?  ?  ?  ?HH Arranged: PT, OT, Nurse's Aide ?[place] Agency: [facility] ?Date HH Agency Contacted: 08/02/21 ?Time [place]: 1509 ?Representative spoke with at [place]: Marjory Lies. ? ?Prior Living Arrangements/Services ?Living arrangements for the past 2 months: [place] ?Lives with:: Spouse ?Patient language and need for interpreter reviewed:: Yes ?       ?Need for Family Participation in Patient Care: Yes (Comment) ?Care giver support system in place?: Yes (comment) ?Current home services: DME (patient has a shower chair at home.) ?Criminal Activity/Legal  Involvement Pertinent to Current Situation/Hospitalization: No - Comment as needed ? ?Activities of Daily Living ?Home Assistive Devices/Equipment: Kasandra Knudsen (specify quad or straight), Grab bars in shower, Hand-held shower hose, Shower chair with back ?ADL Screening (condition at time of admission) ?Patient's cognitive ability adequate to safely complete daily activities?: Yes ?Is the patient deaf or have difficulty hearing?: Yes ?Does the patient have difficulty seeing, even when wearing glasses/contacts?: No ?Does the patient have difficulty concentrating, remembering, or making decisions?: No ?Patient able to express need for assistance with ADLs?: Yes ?Does the patient have difficulty dressing or bathing?: No ?Independently performs ADLs?: Yes (appropriate for developmental age) ?Does the patient have difficulty walking or climbing stairs?: Yes ?Weakness of Legs: Both ?Weakness of Arms/Hands: Both ? ?Permission Sought/Granted ?Permission sought to share information with : Family Supports, Customer service manager, Case Manager ?Permission granted to share information with : Yes, Verbal Permission Granted ?   ? Permission granted to share info w AGENCY: [facility] ?   ?   ? ?Emotional Assessment ?Appearance:: Appears stated age ?Attitude/Demeanor/Rapport: Engaged ?Affect (typically observed): Appropriate ?Orientation: : Oriented to Situation, Oriented to  Time, Oriented to Place, Oriented to Self ?Alcohol / Substance Use: Not Applicable ?Psych Involvement: No (comment) ? ?Admission diagnosis:  Elevated troponin [R77.8] ?Hypertensive urgency [I16.0] ?Hypertensive emergency [I16.1] ?Acute congestive heart failure, unspecified heart failure type ([place]) [I50.9] ?Acute diastolic CHF (congestive heart failure) ([place]) [I50.31] ?Patient Active Problem List  ? Diagnosis Date Noted  ? Acute diastolic CHF (congestive heart failure) ([place])  07/30/2021  ? Acute on chronic diastolic CHF (congestive heart failure)  ([place]) 07/29/2021  ? Elevated troponin 07/29/2021  ? Normocytic anemia 07/29/2021  ? Weakness 07/29/2021  ? AKI (acute kidney injury) ([place])   ? Demand ischemia ([place])   ? Pedal edema 04/16/2021  ? CKD (chronic kidney disease), stage III ([place]) 04/09/2021  ? Glaucoma 11/30/2020  ? Carotid artery disease ([place]) 11/12/2013  ? Claudication ([place]) 12/12/2012  ? Peripheral vascular disease ([place]) 12/12/2012  ? Carotid bruit 08/23/2011  ? Atrial fibrillation with RVR ([place]) 01/12/2011  ? Essential hypertension   ? Hyperlipidemia   ? Coronary artery disease   ? Status post aortic valve replacement with tissue   ? ?PCP:  Haydee Salter, MD ?Pharmacy:   ?[facility] [place], [address] ?[address] ?[place] [address] ?Phone: [phone] Fax: [phone] ? ?[id number] - [place], [place] ?[address] ?[place] [address] ?Phone: [phone] Fax: [phone] ? ?  ? ?Readmission Risk Interventions ?   ? View : No data to display.  ?  ?  ?  ? ? ? ?

## 2021-08-02 NOTE — Plan of Care (Signed)
?  Problem: Education: ?Goal: Ability to demonstrate management of disease process will improve ?Outcome: Adequate for Discharge ?Goal: Ability to verbalize understanding of medication therapies will improve ?Outcome: Adequate for Discharge ?Goal: Individualized Educational Video(s) ?Outcome: Adequate for Discharge ?  ?Problem: Activity: ?Goal: Capacity to carry out activities will improve ?Outcome: Adequate for Discharge ?  ?Problem: Cardiac: ?Goal: Ability to achieve and maintain adequate cardiopulmonary perfusion will improve ?Outcome: Adequate for Discharge ?  ?Problem: Education: ?Goal: Knowledge of General Education information will improve ?Description: Including pain rating scale, medication(s)/side effects and non-pharmacologic comfort measures ?Outcome: Adequate for Discharge ?  ?Problem: Health Behavior/Discharge Planning: ?Goal: Ability to manage health-related needs will improve ?Outcome: Adequate for Discharge ?  ?Problem: Clinical Measurements: ?Goal: Ability to maintain clinical measurements within normal limits will improve ?Outcome: Adequate for Discharge ?Goal: Will remain free from infection ?Outcome: Adequate for Discharge ?Goal: Diagnostic test results will improve ?Outcome: Adequate for Discharge ?Goal: Respiratory complications will improve ?Outcome: Adequate for Discharge ?Goal: Cardiovascular complication will be avoided ?Outcome: Adequate for Discharge ?  ?Problem: Activity: ?Goal: Risk for activity intolerance will decrease ?Outcome: Adequate for Discharge ?  ?Problem: Nutrition: ?Goal: Adequate nutrition will be maintained ?Outcome: Adequate for Discharge ?  ?Problem: Coping: ?Goal: Level of anxiety will decrease ?Outcome: Adequate for Discharge ?  ?Problem: Elimination: ?Goal: Will not experience complications related to bowel motility ?Outcome: Adequate for Discharge ?Goal: Will not experience complications related to urinary retention ?Outcome: Adequate for Discharge ?  ?Problem: Pain  Managment: ?Goal: General experience of comfort will improve ?Outcome: Adequate for Discharge ?  ?Problem: Safety: ?Goal: Ability to remain free from injury will improve ?Outcome: Adequate for Discharge ?  ?Problem: Skin Integrity: ?Goal: Risk for impaired skin integrity will decrease ?Outcome: Adequate for Discharge ?  ?Problem: Acute Rehab PT Goals(only PT should resolve) ?Goal: Pt Will Go Supine/Side To Sit ?Outcome: Adequate for Discharge ?Goal: Patient Will Transfer Sit To/From Stand ?Outcome: Adequate for Discharge ?Goal: Pt Will Ambulate ?Outcome: Adequate for Discharge ?Goal: Pt/caregiver will Perform Home Exercise Program ?Outcome: Adequate for Discharge ?  ?Problem: Acute Rehab OT Goals (only OT should resolve) ?Goal: Pt. Will Perform Grooming ?Outcome: Adequate for Discharge ?Goal: Pt. Will Perform Upper Body Dressing ?Outcome: Adequate for Discharge ?Goal: Pt. Will Transfer To Toilet ?Outcome: Adequate for Discharge ?Goal: Pt. Will Perform Toileting-Clothing Manipulation ?Outcome: Adequate for Discharge ?  ?

## 2021-08-02 NOTE — Progress Notes (Signed)
Physical Therapy Treatment ?Patient Details ?Name: [name] ?MRN: [id number] ?DOB: May 04, 1924 ?Today's Date: 08/02/2021 ? ? ?History of Present Illness The pt is a 88 yo male presenting 3/30 with SOB and weakness, suspect acute CHF exacerbation. PMH includes: CAD s/p CABG and CABG revision with AVR, HTN, HLD, CKD III, and peptic ulcer disease. ? ?  ?PT Comments  ? ? Pt very pleasant reports having walked with nursing staff with feet sore from lack of shoes. Pt able to walk in hall and performed stairs but denied further sit to stands or balance training due to sore feet and encouraged pt to have family bring shoes. Will continue to follow.  ? ?SpO2 90-95% on RA ?   ?Recommendations for follow up therapy are one component of a multi-disciplinary discharge planning process, led by the attending physician.  Recommendations may be updated based on patient status, additional functional criteria and insurance authorization. ? ?Follow Up Recommendations ? Home health PT ?  ?  ?Assistance Recommended at Discharge Intermittent Supervision/Assistance  ?Patient can return home with the following A little help with walking and/or transfers;A little help with bathing/dressing/bathroom;Assistance with cooking/housework;Assist for transportation ?  ?Equipment Recommendations ? None recommended by PT  ?  ?Recommendations for Other Services   ? ? ?  ?Precautions / Restrictions Precautions ?Precautions: Fall  ?  ? ?Mobility ? Bed Mobility ?Overal bed mobility: Modified Independent ?Bed Mobility: Supine to Sit, Sit to Supine ?  ?  ?  ?  ?  ?General bed mobility comments: reliance on rail with HOB 20 degrees x 2 trials ?  ? ?Transfers ?Overall transfer level: Needs assistance ?  ?Transfers: Sit to/from Stand ?Sit to Stand: Supervision ?  ?  ?  ?  ?  ?General transfer comment: supervision for safety and equipment with pt able to stand without assist, steadying with single UE support ?  ? ?Ambulation/Gait ?Ambulation/Gait assistance:  Min guard ?Gait Distance (Feet): 200 Feet ?Assistive device: Rolling walker (2 wheels) ?Gait Pattern/deviations: Step-through pattern, Decreased stride length, Trunk flexed ?  ?Gait velocity interpretation: 1.31 - 2.62 ft/sec, indicative of limited community ambulator ?  ?General Gait Details: reliance on RW with flexed trunk and sats 90-95% on RA ? ? ?Stairs ?Stairs: Yes ?Stairs assistance: Modified independent (Device/Increase time) ?Stair Management: Step to pattern, One rail Right ?Number of Stairs: 4 ?General stair comments: 2 hands on rail with reliance on rail to step up and slow controlled descent with single UE support. Pt normally uses one rail and cane ? ? ?Wheelchair Mobility ?  ? ?Modified Rankin (Stroke Patients Only) ?  ? ? ?  ?Balance Overall balance assessment: Needs assistance ?Sitting-balance support: No upper extremity supported ?Sitting balance-Leahy Scale: Good ?  ?  ?Standing balance support: Bilateral upper extremity supported ?Standing balance-Leahy Scale: Poor ?Standing balance comment: RW for gait ?  ?  ?  ?  ?  ?  ?  ?  ?  ?  ?  ?  ? ?  ?Cognition Arousal/Alertness: Awake/alert ?Behavior During Therapy: [facility] for tasks assessed/performed ?Overall Cognitive Status: Within Functional Limits for tasks assessed ?  ?  ?  ?  ?  ?  ?  ?  ?  ?  ?  ?  ?  ?  ?  ?  ?General Comments: HOH ?  ?  ? ?  ?Exercises   ? ?  ?General Comments   ?  ?  ? ?Pertinent Vitals/Pain Pain Assessment ?Pain Assessment: Faces ?Pain Score: 4  ?  Pain Location: bil feet R>L ?Pain Descriptors / Indicators: Aching, Guarding ?Pain Intervention(s): Limited activity within patient's tolerance, Other (comment) (pt without shoes available)  ? ? ?Home Living   ?  ?  ?  ?  ?  ?  ?  ?  ?  ?   ?  ?Prior Function    ?  ?  ?   ? ?PT Goals (current goals can now be found in the care plan section) Progress towards PT goals: Progressing toward goals ? ?  ?Frequency ? ? ? Min 3X/week ? ? ? ?  ?PT Plan Current plan remains appropriate   ? ? ?Co-evaluation   ?  ?  ?  ?  ? ?  ?AM-PAC PT "6 Clicks" Mobility   ?Outcome Measure ? Help needed turning from your back to your side while in a flat bed without using bedrails?: None ?Help needed moving from lying on your back to sitting on the side of a flat bed without using bedrails?: None ?Help needed moving to and from a bed to a chair (including a wheelchair)?: A Little ?Help needed standing up from a chair using your arms (e.g., wheelchair or bedside chair)?: A Little ?Help needed to walk in hospital room?: A Little ?Help needed climbing 3-5 steps with a railing? : A Little ?6 Click Score: 20 ? ?  ?End of Session   ?Activity Tolerance: Patient tolerated treatment well ?Patient left: in bed;with call bell/phone within reach;with bed alarm set ?Nurse Communication: Mobility status ?PT Visit Diagnosis: Unsteadiness on feet (R26.81);Other abnormalities of gait and mobility (R26.89);Muscle weakness (generalized) (M62.81) ?  ? ? ?Time: [id number] ?PT Time Calculation (min) (ACUTE ONLY): 15 min ? ?Charges:  $Gait Training: 8-22 mins          ?          ? ?Jakye Mullens P, PT ?Acute Rehabilitation Services ?Pager: [phone] ?Office: [phone] ? ? ? ?Cavin Longman B Fabyan Loughmiller ?08/02/2021, 11:45 AM ? ?

## 2021-08-02 NOTE — Progress Notes (Signed)
SATURATION QUALIFICATIONS: (This note is used to comply with regulatory documentation for home oxygen) ? ?Patient Saturations on Room Air at Rest = 94% ? ?Patient Saturations on Room Air while Ambulating = 90% ?Pt maintaining SPO2 on RA and does not require supplemental oxygen ? ?Elanna Bert P, PT ?Acute Rehabilitation Services ?Pager: [phone] ?Office: [phone] ? ? ? ?

## 2021-08-02 NOTE — Care Management Important Message (Signed)
Important Message ? ?Patient Details  ?Name: [name] ?MRN: [id number] ?Date of Birth: 03/21/1925 ? ? ?Medicare Important Message Given:  Yes ? ? ? ? ?Renie Ora ?08/02/2021, 11:57 AM ?

## 2021-08-02 NOTE — Discharge Summary (Signed)
?Physician Discharge Summary ?  ?Patient: [name] MRN: [id number] DOB: Aug 10, 1924  ?Admit date:     07/29/2021  ?Discharge date: 08/02/21  ?Discharge Physician: Kathie Dike  ? ?PCP: Haydee Salter, MD  ? ?Recommendations at discharge:  ? ?Patient to follow up with cardiology as outpatient ?Repeat BMET as an outpatient on cardiology follow up ? ?Discharge Diagnoses: ?Active Problems: ?  Acute on chronic diastolic CHF (congestive heart failure) ([place]) ?  Elevated troponin ?  Coronary artery disease ?  Essential hypertension ?  CKD (chronic kidney disease), stage III ([place]) ?  Hyperlipidemia ?  Status post aortic valve replacement with tissue ?  Normocytic anemia ?  Weakness ?  Acute diastolic CHF (congestive heart failure) ([place]) ? ?Principal Problem (Resolved): ?  Hypertensive urgency ? ?Hospital Course: ?88 y/o male admitted to the hospital with generalized weakness and dyspnea on exertion. Found to have decompensated CHF. On IV lasix. Also had elevated troponin, felt to be demand ischemia. Cardiology following.  ? ?Assessment and Plan: ?Acute on chronic diastolic CHF (congestive heart failure) ([place]) ?Shortness of breath appears to be improving.  Appears to be able to walk further distances. ?Cardiology following ?He was diuresed with IV lasix with improvement of volume status ?He was transitioned back to lasix 40mg  po daily ?Monitor intake and output/daily weights ?Echo shows preserved EF ?Coreg and hydralazine ?Discharge weight is 182 lbs ? ? ?Elevated troponin ?125>157 ?No chest pain or changes on ekg ?Suspected demand ischemia in setting of decompensated CHF ?Echo shows preserved EF with no WMA ?Cardiology following ? ?Coronary artery disease ?History of CABG with revision and AVR in 2009.  ?No chest pain or changes on ekg ?Continue ASA/statin/coreg  ? ?Essential hypertension ?Has had orthostasis/low bp at home with higher doses of medication ?[id number] systolic goal ?Continue coreg/hydralazine.  ?BPs have been  stable ? ?CKD (chronic kidney disease), stage III ([place]) ?Baseline creatinine around 1.3-1.5 ?Mild uptrend in creatinine, likely related to lasix ?Continue to monitor with diuresis  ?Would advise to stop using NSAIDs.  ?Repeat chemistry as an outpatient ? ?Hyperlipidemia ?Continue lipitor  ? ?Status post aortic valve replacement with tissue ?Status post aortic valve replacement with a #23 mm pericardial tissue valve in January of 2009. ?Echo shows stable valve function ? ?Weakness ?Likely multifactorial ?Seen by PT/OT with recommendations for [facility] ?Overall exercise tolerance appears to be improving ?He was able to ambulate a little over 300 feet today. ? ?Normocytic anemia ?Likely ACD ?Baseline hgb: 11-12 ?Hemoglobin currently near baseline ? ? ? ? ?  ? ? ?Consultants: cardiology ?Procedures performed: echo  ?Disposition: Home health ?Diet recommendation:  ?Discharge Diet Orders (From admission, onward)  ? ?  Start     Ordered  ? 08/02/21 0000  Diet - low sodium heart healthy       ? 08/02/21 1328  ? ?  ?  ? ?  ? ?Cardiac diet ?DISCHARGE MEDICATION: ?Allergies as of 08/02/2021   ? ?   Reactions  ? Codeine   ? Hallucinations  ? Penicillins Hives  ? Did it involve swelling of the face/tongue/throat, SOB, or low BP? No ?Did it involve sudden or severe rash/hives, skin peeling, or any reaction on the inside of your mouth or nose? Yes ?Did you need to seek medical attention at a hospital or doctor's office? No ?When did it last happen?     Decades ago  ?If all above answers are "NO", may proceed with cephalosporin use. ?   ? ?  ? ?  ?  Medication List  ?  ? ?STOP taking these medications   ? ?ibuprofen 200 MG tablet ?Commonly known as: ADVIL ?  ? ?  ? ?TAKE these medications   ? ?amLODipine 2.5 MG tablet ?Commonly known as: NORVASC ?TAKE 1 TABLET BY MOUTH EVERY DAY ?  ?aspirin 81 MG chewable tablet ?Commonly known as: Aspirin Childrens ?Chew 1 tablet (81 mg total) by mouth daily. ?  ?atorvastatin 40 MG tablet ?Commonly known  as: LIPITOR ?TAKE 1 TABLET BY MOUTH EVERY DAY ?  ?carvedilol 6.25 MG tablet ?Commonly known as: COREG ?Take 1 tablet (6.25 mg total) by mouth 2 (two) times daily with a meal. ?  ?Combigan 0.2-0.5 % ophthalmic solution ?Generic drug: brimonidine-timolol ?Place 1 drop into the left eye every 12 (twelve) hours. ?  ?furosemide 20 MG tablet ?Commonly known as: LASIX ?Take 2 tablets (40 mg total) by mouth daily. ?  ?hydrALAZINE 50 MG tablet ?Commonly known as: APRESOLINE ?Take 1 tablet (50 mg total) by mouth 3 (three) times daily. ?  ?nitroGLYCERIN 0.4 MG SL tablet ?Commonly known as: NITROSTAT ?Place 1 tablet (0.4 mg total) under the tongue every 5 (five) minutes as needed. ?  ?Travatan Z 0.004 % Soln ophthalmic solution ?Generic drug: Travoprost (BAK Free) ?Place 1 drop into the left eye at bedtime. ?  ? ?  ? ? Follow-up Information   ? ? [place], Peter M, MD Follow up.   ?Specialty: Cardiology ?Why: office will contact you for follow up ?Contact information: ?[address] ?[address] ?[place] [address] ?[phone] ? ? ?  ?  ? ?  ?  ? ?  ? ?Discharge Exam: ?Filed Weights  ? 07/30/21 0406 08/01/21 [id number] 08/02/21 0641  ?Weight: 83.4 kg 82.6 kg 82.7 kg  ? ?General exam: Alert, awake, oriented x 3 ?Respiratory system: minimal crackles at bases. Respiratory effort normal. ?Cardiovascular system:RRR. No murmurs, rubs, gallops. ?Gastrointestinal system: Abdomen is nondistended, soft and nontender. No organomegaly or masses felt. Normal bowel sounds heard. ?Central nervous system: Alert and oriented. No focal neurological deficits. ?Extremities: No C/C/E, +pedal pulses ?Skin: No rashes, lesions or ulcers ?Psychiatry: Judgement and insight appear normal. Mood & affect appropriate.  ? ? ?Condition at discharge: good ? ?The results of significant diagnostics from this hospitalization (including imaging, microbiology, ancillary and laboratory) are listed below for reference.  ? ?Imaging Studies: ?DG Chest Portable 1  View ? ?Result Date: 07/29/2021 ?CLINICAL DATA:  Shortness of breath. EXAM: PORTABLE CHEST 1 VIEW COMPARISON:  April 09, 2021. FINDINGS: Stable cardiomegaly. Status post coronary bypass graft. Bibasilar atelectasis or edema is noted. Bony thorax is unremarkable. IMPRESSION: Mild bibasilar subsegmental atelectasis or edema. Aortic Atherosclerosis (ICD10-I70.0). Electronically Signed   By: Marijo Conception M.D.   On: 07/29/2021 13:15  ? ?ECHOCARDIOGRAM COMPLETE ? ?Result Date: 07/30/2021 ?   ECHOCARDIOGRAM REPORT   Patient Name:   MOHD BOLSINGER Date of Exam: 07/30/2021 Medical Rec #:  [id number]       Height:       66.0 in Accession #:    [id number]      Weight:       183.9 lb Date of Birth:  [birth year]       BSA:          1.930 m? Patient Age:    88 years        BP:           145/60 mmHg Patient Gender: M  HR:           71 bpm. Exam Location:  Inpatient Procedure: 2D Echo, Cardiac Doppler and Color Doppler Indications:    elevated troponin  History:        Patient has prior history of Echocardiogram examinations, most                 recent 04/09/2021. CAD, Arrythmias:Atrial Fibrillation; Risk                 Factors:Hypertension and Dyslipidemia. AOV REPLACEMENT.                 Aortic Valve: 23 mm Edwards bovine valve is present in the                 aortic position. Procedure Date: 2009.  Sonographer:    Beryle Beams Referring Phys: [id number] [place]  1. Left ventricular ejection fraction, by estimation, is 50 to 55%. The left ventricle has low normal function. The left ventricle has no regional wall motion abnormalities. There is moderate concentric left ventricular hypertrophy. Left ventricular diastolic parameters are indeterminate.  2. Right ventricular systolic function is moderately reduced. The right ventricular size is moderately enlarged. There is normal pulmonary artery systolic pressure.  3. Left atrial size was severely dilated.  4. Right atrial size was mildly dilated.  5.  The mitral valve is grossly normal. Mild mitral valve regurgitation. No evidence of mitral stenosis. Severe mitral annular calcification.  6. The aortic valve has been repaired/replaced. Aortic valve regurgitati

## 2021-08-03 ENCOUNTER — Encounter: Payer: Self-pay | Admitting: Family Medicine

## 2021-08-04 ENCOUNTER — Encounter: Payer: Self-pay | Admitting: Family Medicine

## 2021-08-04 ENCOUNTER — Ambulatory Visit (INDEPENDENT_AMBULATORY_CARE_PROVIDER_SITE_OTHER): Payer: PPO

## 2021-08-04 ENCOUNTER — Ambulatory Visit (INDEPENDENT_AMBULATORY_CARE_PROVIDER_SITE_OTHER): Payer: PPO | Admitting: Family Medicine

## 2021-08-04 ENCOUNTER — Telehealth: Payer: Self-pay

## 2021-08-04 VITALS — BP 146/58 | HR 78 | Temp 97.9°F | Ht 66.0 in | Wt 182.0 lb

## 2021-08-04 DIAGNOSIS — I10 Essential (primary) hypertension: Secondary | ICD-10-CM

## 2021-08-04 DIAGNOSIS — M79671 Pain in right foot: Secondary | ICD-10-CM | POA: Diagnosis not present

## 2021-08-04 DIAGNOSIS — I5033 Acute on chronic diastolic (congestive) heart failure: Secondary | ICD-10-CM

## 2021-08-04 NOTE — Telephone Encounter (Signed)
Spoke to [place] at [facility] to give verbal for PT/OT, Social worker, Home health Aid, and skilled nursing per providers request. Dm/cma ? ?

## 2021-08-04 NOTE — Patient Instructions (Signed)
Elevate right foot as much as possible. ?Use a frozen water bottle to stretch the bottom of the foot to try and reduce foot pain form possible plantar fasciitis. ? ? ?Plantar Fasciitis ?Plantar fasciitis is a painful foot condition that affects the heel. It occurs when the band of tissue that connects the toes to the heel bone (plantar fascia) becomes irritated. This can happen as the result of exercising too much or doing other repetitive activities (overuse injury). ?Plantar fasciitis can cause mild irritation to severe pain that makes it difficult to walk or move. The pain is usually worse in the morning after sleeping, or after sitting or lying down for a period of time. Pain may also be worse after long periods of walking or standing. ?What are the causes? ?This condition may be caused by: ?Standing for long periods of time. ?Wearing shoes that do not have good arch support. ?Doing activities that put stress on joints (high-impact activities). This includes ballet and exercise that makes your heart beat faster (aerobic exercise), such as running. ?Being overweight. ?An abnormal way of walking (gait). ?Tight muscles in the back of your lower leg (calf). ?High arches in your feet or flat feet. ?Starting a new athletic activity. ?What are the signs or symptoms? ?The main symptom of this condition is heel pain. Pain may get worse after the following: ?Taking the first steps after a time of rest, especially in the morning after awakening, or after you have been sitting or lying down for a while. ?Long periods of standing still. ?Pain may decrease after 30-45 minutes of activity, such as gentle walking. ?How is this diagnosed? ?This condition may be diagnosed based on your medical history, a physical exam, and your symptoms. Your health care provider will check for: ?A tender area on the bottom of your foot. ?A high arch in your foot or flat feet. ?Pain when you move your foot. ?Difficulty moving your foot. ?You may  have imaging tests to confirm the diagnosis, such as: ?X-rays. ?Ultrasound. ?MRI. ?How is this treated? ?Treatment for plantar fasciitis depends on how severe your condition is. Treatment may include: ?Rest, ice, pressure (compression), and raising (elevating) the affected foot. This is called RICE therapy. Your health care provider may recommend RICE therapy along with over-the-counter pain medicines to manage your pain. ?Exercises to stretch your calves and your plantar fascia. ?A splint that holds your foot in a stretched, upward position while you sleep (night splint). ?Physical therapy to relieve symptoms and prevent problems in the future. ?Injections of steroid medicine (cortisone) to relieve pain and inflammation. ?Stimulating your plantar fascia with electrical impulses (extracorporeal shock wave therapy). This is usually the last treatment option before surgery. ?Surgery, if other treatments have not worked after 12 months. ?Follow these instructions at home: ?Managing pain, stiffness, and swelling ? ?If directed, put ice on the painful area. To do this: ?Put ice in a plastic bag, or use a frozen bottle of water. ?Place a towel between your skin and the bag or bottle. ?Roll the bottom of your foot over the bag or bottle. ?Do this for 20 minutes, 2-3 times a day. ?Wear athletic shoes that have air-sole or gel-sole cushions, or try soft shoe inserts that are designed for plantar fasciitis. ?Elevate your foot above the level of your heart while you are sitting or lying down. ?Activity ?Avoid activities that cause pain. Ask your health care provider what activities are safe for you. ?Do physical therapy exercises and stretches as told  by your health care provider. ?Try activities and forms of exercise that are easier on your joints (low impact). Examples include swimming, water aerobics, and biking. ?General instructions ?Take over-the-counter and prescription medicines only as told by your health care  provider. ?Wear a night splint while sleeping, if told by your health care provider. Loosen the splint if your toes tingle, become numb, or turn cold and blue. ?Maintain a healthy weight, or work with your health care provider to lose weight as needed. ?Keep all follow-up visits. This is important. ?Contact a health care provider if you have: ?Symptoms that do not go away with home treatment. ?Pain that gets worse. ?Pain that affects your ability to move or do daily activities. ?Summary ?Plantar fasciitis is a painful foot condition that affects the heel. It occurs when the band of tissue that connects the toes to the heel bone (plantar fascia) becomes irritated. ?Heel pain is the main symptom of this condition. It may get worse after exercising too much or standing still for a long time. ?Treatment varies, but it usually starts with rest, ice, pressure (compression), and raising (elevating) the affected foot. This is called RICE therapy. Over-the-counter medicines can also be used to manage pain. ?This information is not intended to replace advice given to you by your health care provider. Make sure you discuss any questions you have with your health care provider. ?Document Revised: 08/05/2019 Document Reviewed: 08/05/2019 ?Elsevier Patient Education ? 2022 Elsevier Inc. ? ?

## 2021-08-04 NOTE — Progress Notes (Signed)
?[facility] ?[facility] ?[place] ?[place] [address] ?Dept: [phone] ?Dept Fax: [phone] ? ?Office Visit ? ?Subjective:  ? ? Patient ID: [name], male    DOB: [birth year], 88 y.o..   MRN: [id number] ? ?Chief Complaint  ?Patient presents with  ? Hospitalization Follow-up  ?  Hospital f/u,  having swelling and pain in the Rt foot.    ? ? ?History of Present Illness: ? ?Patient is in today for hospital follow up. Mr. [name] was admitted at [facility] on 3/30-08/02/2021 with decompensated CHF. He was managed with Lasix. He had an elevated troponin that was felt to be due to demand ischemia. He had been taking ibuprofen recently due to right foot pain. he notes that no one evaluated his foot during his hospitalization. He was advised not to take NSAIDs. He has concerns about how to manage his foot pain. ? ?Mr. [name] notes that his breathing is doing better, but he does still have some mild dyspnea with exertion. ? ?Mr. [name] continues to live alone. His family does feel that it would be beneficial to have more services to assist with his care. The discharging physician did place a home health referral. ? ?Past Medical History: ?Patient Active Problem List  ? Diagnosis Date Noted  ? Acute diastolic CHF (congestive heart failure) ([place]) 07/30/2021  ? Acute on chronic diastolic CHF (congestive heart failure) ([place]) 07/29/2021  ? Elevated troponin 07/29/2021  ? Normocytic anemia 07/29/2021  ? Weakness 07/29/2021  ? AKI (acute kidney injury) ([place])   ? Demand ischemia ([place])   ? Pedal edema 04/16/2021  ? CKD (chronic kidney disease), stage III ([place]) 04/09/2021  ? Glaucoma 11/30/2020  ? Carotid artery disease ([place]) 11/12/2013  ? Claudication ([place]) 12/12/2012  ? Peripheral vascular disease ([place]) 12/12/2012  ? Carotid bruit 08/23/2011  ? Atrial fibrillation with RVR ([place]) 01/12/2011  ? Essential hypertension   ? Hyperlipidemia   ? Coronary artery disease   ? Status post  aortic valve replacement with tissue   ? ?Past Surgical History:  ?Procedure Laterality Date  ? APPENDECTOMY    ? CARDIAC CATHETERIZATION  04/02/2007  ? EF 60%/severe two-vessel obstructive coronary artery disease/patent saphenous bein graft to rt coronary/ severe stenosis at the ostium of the lt internal mammary artery graft to the abtuse marginal branch/normal lt ventricular function/ severe aortic stenosis/normal rt heart pressures  ? CARDIAC CATHETERIZATION  07/25/2002  ? EF 65%/two-vessel obstructive atherosclerotic coronary artery disease/ patent saphenous vein graft to the distal rt coronary arter//patent lt internal mammary artery graft to the obtuse marginal branch/normal lt ventricular function  ? CARDIAC CATHETERIZATION  05/17/1991  ? EF65%/two-vessel ovstructive atherosclerotic coronary artery disease/good lt ventricular performance/compared with previous catheterization there is now total occlusion of the tr coronary artery at the prior angioplasty site/good collateral flow to the distal rt coronary artery is seen/there is also progressive disease in the lt circumflex vessel now to obstructive levels  ? CARDIAC CATHETERIZATION  07/26/1990  ? EF60%/borderline obstructive coronary artery disease in the mid lt circumflex & in the rt coronary artery prior angioplasty site/normal lt ventricular function  ? CARDIAC CATHETERIZATION  05/23/1990  ? successful percutaneous transluminal coronary angioplasty of the proximal rt coronary artery  ? CATARACT EXTRACTION W/ INTRAOCULAR LENS IMPLANT Bilateral   ? CORONARY ANGIOPLASTY  07/12/1990  ? single vessel obstructive atherosclerotic coronary atrery disease in the rt corornary artery with restenosis of the primary angioplasty site/successful repeat PTCA of the proximal rt coronary  arter  ? CORONARY ARTERY BYPASS GRAFT    ? CORONARY ARTERY BYPASS GRAFT    ? x2 using a reverse saphenous vein graft to posterior descending, reseverse saphenous vein graft to lt internal  mammary artery at the previous bypass to the circumflex with a no vein harvesting  ? GANGLION CYST EXCISION Right   ? Wrist  ? HEMORRHOID SURGERY    ? at age 77  ? INGUINAL HERNIA REPAIR  05/02/1965  ? PENILE PROSTHESIS IMPLANT  05/03/1987  ? STERNOTOMY    ? redo median with aortic valve replacement with a pericardial tissue valve/[employer] model 3000,62mm,serial numver [id number]  ? TONSILLECTOMY    ? at age 18  ? VASECTOMY    ? at age 58  ? ?Family History  ?Problem Relation Age of Onset  ? Cancer Father 54  ?     intestinal  ? Heart attack Mother 12  ? Stroke Sister   ? ?Outpatient Medications Prior to Visit  ?Medication Sig Dispense Refill  ? amLODipine (NORVASC) 2.5 MG tablet TAKE 1 TABLET BY MOUTH EVERY DAY 30 tablet 6  ? aspirin (ASPIRIN CHILDRENS) 81 MG chewable tablet Chew 1 tablet (81 mg total) by mouth daily. 36 tablet 11  ? atorvastatin (LIPITOR) 40 MG tablet TAKE 1 TABLET BY MOUTH EVERY DAY 90 tablet 3  ? carvedilol (COREG) 6.25 MG tablet Take 1 tablet (6.25 mg total) by mouth 2 (two) times daily with a meal. 180 tablet 3  ? COMBIGAN 0.2-0.5 % ophthalmic solution Place 1 drop into the left eye every 12 (twelve) hours.     ? furosemide (LASIX) 20 MG tablet Take 2 tablets (40 mg total) by mouth daily. 180 tablet 3  ? hydrALAZINE (APRESOLINE) 50 MG tablet Take 1 tablet (50 mg total) by mouth 3 (three) times daily. 270 tablet 3  ? nitroGLYCERIN (NITROSTAT) 0.4 MG SL tablet Place 1 tablet (0.4 mg total) under the tongue every 5 (five) minutes as needed. 25 tablet 1  ? TRAVATAN Z 0.004 % SOLN ophthalmic solution Place 1 drop into the left eye at bedtime.     ? ?No facility-administered medications prior to visit.  ? ?Allergies  ?Allergen Reactions  ? Codeine   ?  Hallucinations  ? Penicillins Hives  ?  Did it involve swelling of the face/tongue/throat, SOB, or low BP? No ?Did it involve sudden or severe rash/hives, skin peeling, or any reaction on the inside of your mouth or nose? Yes ?Did you need to  seek medical attention at a hospital or doctor's office? No ?When did it last happen?     Decades ago  ?If all above answers are "NO", may proceed with cephalosporin use. ?   ?   ?Objective:  ? ?Today's Vitals  ? 08/04/21 1558  ?BP: (!) 146/58  ?Pulse: 78  ?Temp: 97.9 ?F (36.6 ?C)  ?TempSrc: Temporal  ?SpO2: 92%  ?Weight: 182 lb (82.6 kg)  ?Height: 5\' 6"  (1.676 m)  ? ?Body mass index is 29.38 kg/m?.  ? ?General: Well developed, well nourished. No acute distress. No diaphoresis. ?Extremities:Trace edema in left leg. Right lower leg with 1+ edema, but 2+ edema in right foot. Pain  ? noted more generally along the lateral foot. ?Psych: Alert and oriented. Normal mood and affect. ? ?Health Maintenance Due  ?Topic Date Due  ? Zoster Vaccines- Shingrix (1 of 2) Never done  ? Pneumonia Vaccine 57+ Years old (2 - PPSV23 if available, else PCV20) 02/13/2019  ?  COVID-19 Vaccine (4 - Booster for Collins series) 03/29/2021  ?   ?Imaging: ?Right Foot x-ray- No fracture or dislocation. Heel spurs present. ? ?Assessment & Plan:  ? ?1. Acute on chronic diastolic CHF (congestive heart failure) ([place]) ?Reviewed hospital discharge summary and admission H& P. Reviewed admission CXR. CHF appears to be back to a compensated state. Weight is down 2 lbs form discharge. I reinforced the recommendation to no longer take NSAIDs. I will have my MA contact [place] about moving ahead with plans for PT, OT, a home health aide, and social worker consult. He will follow up with cardiology. He will see me back in about 1 month. ? ?2. Essential hypertension ?Blood pressures are improved. Continue hydralazine, amlodipine, and carvedilol. ? ?3. Right foot pain ?The pain may be related to edema. There are also some aspects of possibel plantar fasciitis (notes pain worse with first step in the morning). I recommend he keep the foot elevated as much as possible to relive swelling. We discussed using a frozen water bottle to stretch the plantar fascia.  He is to not take NSAIDs. He has had prior reactions ot codeine and tramadol. I am concerned that a stronger opioid would be riskier. I advised him to try Tylenol. If not improving, he should follow up f

## 2021-08-05 ENCOUNTER — Ambulatory Visit: Payer: PPO | Admitting: Family Medicine

## 2021-08-09 NOTE — Telephone Encounter (Signed)
Lft detailed verbal okay for HH and PT on secured VM .  Dm/cma ? ?

## 2021-08-09 NOTE — Telephone Encounter (Signed)
Jae Dire from [facility] is needing verbal orders stating HH and PT for 1TIME/8WEEKS. Please advise Jae Dire at [phone] ?

## 2021-08-16 ENCOUNTER — Telehealth: Payer: Self-pay | Admitting: Cardiology

## 2021-08-16 NOTE — Telephone Encounter (Signed)
Son [name] has a question about furosemide dosage. Verified with patient's pharmacy that patient is to take lasix 40 mg each day. Son [name] informed and voiced understanding. ?

## 2021-08-16 NOTE — Telephone Encounter (Signed)
Pt c/o medication issue: ? ?1. Name of Medication: furosemide (LASIX) 20 MG tablet ? ?2. How are you currently taking this medication (dosage and times per day)? 40 mg (two tablets) by mouth daily ? ?3. Are you having a reaction (difficulty breathing--STAT)? no ? ?4. What is your medication issue? Patient said his bottle has instructions to take only one pill daily. The patient would like clarification on dosage from Dr. [place] ?

## 2021-08-17 NOTE — Telephone Encounter (Signed)
Agree  PJ 

## 2021-09-08 ENCOUNTER — Encounter: Payer: Self-pay | Admitting: Family Medicine

## 2021-09-08 ENCOUNTER — Ambulatory Visit (INDEPENDENT_AMBULATORY_CARE_PROVIDER_SITE_OTHER): Payer: PPO | Admitting: Family Medicine

## 2021-09-08 VITALS — BP 122/64 | HR 71 | Temp 97.3°F | Ht 66.0 in | Wt 173.0 lb

## 2021-09-08 DIAGNOSIS — I10 Essential (primary) hypertension: Secondary | ICD-10-CM

## 2021-09-08 DIAGNOSIS — L309 Dermatitis, unspecified: Secondary | ICD-10-CM

## 2021-09-08 DIAGNOSIS — I5032 Chronic diastolic (congestive) heart failure: Secondary | ICD-10-CM | POA: Diagnosis not present

## 2021-09-08 DIAGNOSIS — N1831 Chronic kidney disease, stage 3a: Secondary | ICD-10-CM

## 2021-09-08 DIAGNOSIS — E782 Mixed hyperlipidemia: Secondary | ICD-10-CM

## 2021-09-08 DIAGNOSIS — D649 Anemia, unspecified: Secondary | ICD-10-CM

## 2021-09-08 DIAGNOSIS — E875 Hyperkalemia: Secondary | ICD-10-CM

## 2021-09-08 LAB — BASIC METABOLIC PANEL
BUN: 49 mg/dL — ABNORMAL HIGH (ref 6–23)
CO2: 24 mEq/L (ref 19–32)
Calcium: 9 mg/dL (ref 8.4–10.5)
Chloride: 110 mEq/L (ref 96–112)
Creatinine, Ser: 1.58 mg/dL — ABNORMAL HIGH (ref 0.40–1.50)
GFR: 37.01 mL/min — ABNORMAL LOW (ref >60.00)
Glucose, Bld: 112 mg/dL — ABNORMAL HIGH (ref 70–99)
Potassium: 5.9 mEq/L — ABNORMAL HIGH (ref 3.5–5.1)
Sodium: 139 mEq/L (ref 135–145)

## 2021-09-08 LAB — LIPID PANEL
Cholesterol: 162 mg/dL (ref 0–200)
HDL: 52.4 mg/dL (ref >39.00)
LDL Cholesterol: 74 mg/dL (ref 0–99)
NonHDL: 110.05
Total CHOL/HDL Ratio: 3
Triglycerides: 178 mg/dL — ABNORMAL HIGH (ref 0.0–149.0)
VLDL: 35.6 mg/dL (ref 0.0–40.0)

## 2021-09-08 LAB — CBC
HCT: 33.3 % — ABNORMAL LOW (ref 39.0–52.0)
Hemoglobin: 10.9 g/dL — ABNORMAL LOW (ref 13.0–17.0)
MCHC: 32.8 g/dL (ref 30.0–36.0)
MCV: 88.6 fl (ref 78.0–100.0)
Platelets: 161 10*3/uL (ref 150.0–400.0)
RBC: 3.76 Mil/uL — ABNORMAL LOW (ref 4.22–5.81)
RDW: 17 % — ABNORMAL HIGH (ref 11.5–15.5)
WBC: 6.2 10*3/uL (ref 4.0–10.5)

## 2021-09-08 MED ORDER — CLOBETASOL PROPIONATE 0.05 % EX CREA: 1.0000 "application " | TOPICAL_CREAM | Freq: Two times a day (BID) | CUTANEOUS | 0 refills | Status: DC

## 2021-09-08 NOTE — Progress Notes (Signed)
?[facility] ?[facility] ?[address] ?[place] [id number] ?Dept: [phone] ?Dept Fax: [phone] ? ?Chronic Care Office Visit ? ?Subjective:  ? ? Patient ID: [name], male    DOB: 1925/04/19, 88 y.o..   MRN: [id number] ? ?Chief Complaint  ?Patient presents with  ? Follow-up  ?  3 month f/u.    ? ? ?History of Present Illness: ? ?Patient is in today for reassessment of chronic medical issues. ? ?Mr. [name] has a history of CAD and chronic diastolic heart failure. He had an episode of acute heart failure in early March. He feels like he has recovered quite a bit since then. Home Health had him walk about 100 yards yesterday. He admits to some dyspnea with this that resolved fairly quickly with rest. He notes he does still get out and use his mower to keep up with 6 acres of grass. His heart failure is managed on carvedilol and furosemide. ? ?Mr. [name] has a history of hypertension. He has been prone to some orthostasis. He is currently on amlodipine and hydralazine. ? ?Mr. [name] has a history of hyperlipidemia. He is managed on atorvastatin. ? ?Mr. [name] notes he has a long standing area of rash on his left arm. This is pruritic at times. ? ?Past Medical History: ?Patient Active Problem List  ? Diagnosis Date Noted  ? Chronic diastolic heart failure (HCC) 09/08/2021  ? Acute on chronic diastolic CHF (congestive heart failure) (HCC) 07/29/2021  ? Elevated troponin 07/29/2021  ? Normocytic anemia 07/29/2021  ? Weakness 07/29/2021  ? Demand ischemia (HCC)   ? Pedal edema 04/16/2021  ? CKD (chronic kidney disease), stage III (HCC) 04/09/2021  ? Glaucoma 11/30/2020  ? Carotid artery disease (HCC) 11/12/2013  ? Claudication (HCC) 12/12/2012  ? Peripheral vascular disease (HCC) 12/12/2012  ? Carotid bruit 08/23/2011  ? Atrial fibrillation with RVR (HCC) 01/12/2011  ? Essential hypertension   ? Hyperlipidemia   ? Coronary artery disease   ? Status post aortic  valve replacement with tissue   ? ?Past Surgical History:  ?Procedure Laterality Date  ? APPENDECTOMY    ? CARDIAC CATHETERIZATION  04/02/2007  ? EF 60%/severe two-vessel obstructive coronary artery disease/patent saphenous bein graft to rt coronary/ severe stenosis at the ostium of the lt internal mammary artery graft to the abtuse marginal branch/normal lt ventricular function/ severe aortic stenosis/normal rt heart pressures  ? CARDIAC CATHETERIZATION  07/25/2002  ? EF 65%/two-vessel obstructive atherosclerotic coronary artery disease/ patent saphenous vein graft to the distal rt coronary arter//patent lt internal mammary artery graft to the obtuse marginal branch/normal lt ventricular function  ? CARDIAC CATHETERIZATION  05/17/1991  ? EF65%/two-vessel ovstructive atherosclerotic coronary artery disease/good lt ventricular performance/compared with previous catheterization there is now total occlusion of the tr coronary artery at the prior angioplasty site/good collateral flow to the distal rt coronary artery is seen/there is also progressive disease in the lt circumflex vessel now to obstructive levels  ? CARDIAC CATHETERIZATION  07/26/1990  ? EF60%/borderline obstructive coronary artery disease in the mid lt circumflex & in the rt coronary artery prior angioplasty site/normal lt ventricular function  ? CARDIAC CATHETERIZATION  05/23/1990  ? successful percutaneous transluminal coronary angioplasty of the proximal rt coronary artery  ? CATARACT EXTRACTION W/ INTRAOCULAR LENS IMPLANT Bilateral   ? CORONARY ANGIOPLASTY  07/12/1990  ? single vessel obstructive atherosclerotic coronary atrery disease in the rt corornary artery with restenosis of the primary angioplasty site/successful repeat PTCA of the  proximal rt coronary arter  ? CORONARY ARTERY BYPASS GRAFT    ? CORONARY ARTERY BYPASS GRAFT    ? x2 using a reverse saphenous vein graft to posterior descending, reseverse saphenous vein graft to lt internal mammary  artery at the previous bypass to the circumflex with a no vein harvesting  ? GANGLION CYST EXCISION Right   ? Wrist  ? HEMORRHOID SURGERY    ? at age 9  ? INGUINAL HERNIA REPAIR  05/02/1965  ? PENILE PROSTHESIS IMPLANT  05/03/1987  ? STERNOTOMY    ? redo median with aortic valve replacement with a pericardial tissue valve/[employer] model 3000,50mm,serial numver [id number]  ? TONSILLECTOMY    ? at age 45  ? VASECTOMY    ? at age 50  ? ?Family History  ?Problem Relation Age of Onset  ? Cancer Father 26  ?     intestinal  ? Heart attack Mother 61  ? Stroke Sister   ? ?Outpatient Medications Prior to Visit  ?Medication Sig Dispense Refill  ? amLODipine (NORVASC) 2.5 MG tablet TAKE 1 TABLET BY MOUTH EVERY DAY 30 tablet 6  ? aspirin (ASPIRIN CHILDRENS) 81 MG chewable tablet Chew 1 tablet (81 mg total) by mouth daily. 36 tablet 11  ? atorvastatin (LIPITOR) 40 MG tablet TAKE 1 TABLET BY MOUTH EVERY DAY 90 tablet 3  ? carvedilol (COREG) 6.25 MG tablet Take 1 tablet (6.25 mg total) by mouth 2 (two) times daily with a meal. 180 tablet 3  ? COMBIGAN 0.2-0.5 % ophthalmic solution Place 1 drop into the left eye every 12 (twelve) hours.     ? furosemide (LASIX) 20 MG tablet Take 2 tablets (40 mg total) by mouth daily. 180 tablet 3  ? nitroGLYCERIN (NITROSTAT) 0.4 MG SL tablet Place 1 tablet (0.4 mg total) under the tongue every 5 (five) minutes as needed. 25 tablet 1  ? TRAVATAN Z 0.004 % SOLN ophthalmic solution Place 1 drop into the left eye at bedtime.     ? hydrALAZINE (APRESOLINE) 50 MG tablet Take 1 tablet (50 mg total) by mouth 3 (three) times daily. 270 tablet 3  ? ?No facility-administered medications prior to visit.  ? ?Allergies  ?Allergen Reactions  ? Codeine   ?  Hallucinations  ? Penicillins Hives  ?  Did it involve swelling of the face/tongue/throat, SOB, or low BP? No ?Did it involve sudden or severe rash/hives, skin peeling, or any reaction on the inside of your mouth or nose? Yes ?Did you need to seek  medical attention at a hospital or doctor's office? No ?When did it last happen?     Decades ago  ?If all above answers are "NO", may proceed with cephalosporin use. ?   ?  ?Objective:  ? ?Today's Vitals  ? 09/08/21 0808  ?BP: 122/64  ?Pulse: 71  ?Temp: (!) 97.3 ?F (36.3 ?C)  ?TempSrc: Temporal  ?SpO2: 97%  ?Weight: 173 lb (78.5 kg)  ?Height: 5\' 6"  (1.676 m)  ? ?Body mass index is 27.92 kg/m?.  ? ?General: Well developed, well nourished. No acute distress. ?Lungs: Clear to auscultation bilaterally. No wheezing, rales or rhonchi. ?CV: RRR without murmurs or rubs. Pulses 2+ bilaterally. ?Extremities: Trace edema. ?Skin: Warm and dry. There is a 5-6 cm circumscribed patch with erythema and a central scaliness. ?Psych: Alert and oriented. Normal mood and affect. ? ?Health Maintenance Due  ?Topic Date Due  ? Zoster Vaccines- Shingrix (1 of 2) Never done  ? Pneumonia Vaccine 65+  Years old (2 - PPSV23 if available, else PCV20) 02/13/2019  ?   ?Assessment & Plan:  ? ?1. Chronic diastolic heart failure ([place]) ?Mr. Spiering's weight is down 9 lbs. from his last visit. He is edema is much improved and he is breathing much better. We did discuss that some dyspnea is to be expected with exertion. His heart failure appears to be currently compensated and would be considered NYHA Class II. Continue carvedilol. 6.25 mg bid and furosemide 40 mg daily. I will reassess his potassium and renal function today. ? ?- Basic metabolic panel ? ?2. Essential hypertension ?Blood pressure is at goal and Mr. Manolis is not having orthostasis. We will continue his amlodipine 2.5 mg daily and hydralazine 50 mg TID. ? ?- Basic metabolic panel ? ?3. Mixed hyperlipidemia ?Continue atorvastatin 40 mg daily. We will reassess lipids. ? ?- Lipid panel ? ?4. Stage 3a chronic kidney disease ([place]) ?Reassess as noted above. ? ?- Basic metabolic panel ? ?5. Normocytic anemia ?Past history of anemia. I will recheck its status.- CBC ? ? ?Return in about 3 months  (around 12/09/2021) for Reassessment.  ? ?Haydee Salter, MD ?

## 2021-09-08 NOTE — Addendum Note (Signed)
Addended by: Haydee Salter on: 09/08/2021 01:01 PM ? ? Modules accepted: Orders ? ?

## 2021-09-10 ENCOUNTER — Ambulatory Visit: Payer: PPO | Admitting: Family Medicine

## 2021-10-06 ENCOUNTER — Ambulatory Visit: Payer: PPO | Admitting: Cardiology

## 2021-10-27 ENCOUNTER — Ambulatory Visit: Payer: PPO | Admitting: Cardiology

## 2021-11-07 NOTE — Progress Notes (Unsigned)
Cardiology Clinic Note   Patient Name: [name] Date of Encounter: 11/07/2021  Primary Care Provider:  Loyola Mast, MD Primary Cardiologist:  Peter Swaziland, MD  Patient Profile    KYE HEDDEN 88 year old male presents the clinic today for follow-up evaluation of his coronary artery disease and atrial fibrillation.  Past Medical History    Past Medical History:  Diagnosis Date   Carotid arterial disease (HCC)    Doppler in 2003 with 40 to 60% L ICA   Carpal tunnel syndrome    Coronary artery disease    s/p CABG & redo CABG with AVR   Hyperlipidemia    Hypertension    Meningitis    Normal echocardiogram Sept 2012   Has normal EF, mild AS and moderate LAE   PAF (paroxysmal atrial fibrillation) [facility]) Sept 2012   converted spontaneously   PUD (peptic ulcer disease)    Remote history   Status post aortic valve replacement with tissue    for aortic stenosis   Past Surgical History:  Procedure Laterality Date   APPENDECTOMY     CARDIAC CATHETERIZATION  04/02/2007   EF 60%/severe two-vessel obstructive coronary artery disease/patent saphenous bein graft to rt coronary/ severe stenosis at the ostium of the lt internal mammary artery graft to the abtuse marginal branch/normal lt ventricular function/ severe aortic stenosis/normal rt heart pressures   CARDIAC CATHETERIZATION  07/25/2002   EF 65%/two-vessel obstructive atherosclerotic coronary artery disease/ patent saphenous vein graft to the distal rt coronary arter//patent lt internal mammary artery graft to the obtuse marginal branch/normal lt ventricular function   CARDIAC CATHETERIZATION  05/17/1991   EF65%/two-vessel ovstructive atherosclerotic coronary artery disease/good lt ventricular performance/compared with previous catheterization there is now total occlusion of the tr coronary artery at the prior angioplasty site/good collateral flow to the distal rt coronary artery is seen/there is also progressive  disease in the lt circumflex vessel now to obstructive levels   CARDIAC CATHETERIZATION  07/26/1990   EF60%/borderline obstructive coronary artery disease in the mid lt circumflex & in the rt coronary artery prior angioplasty site/normal lt ventricular function   CARDIAC CATHETERIZATION  05/23/1990   successful percutaneous transluminal coronary angioplasty of the proximal rt coronary artery   CATARACT EXTRACTION W/ INTRAOCULAR LENS IMPLANT Bilateral    CORONARY ANGIOPLASTY  07/12/1990   single vessel obstructive atherosclerotic coronary atrery disease in the rt corornary artery with restenosis of the primary angioplasty site/successful repeat PTCA of the proximal rt coronary arter   CORONARY ARTERY BYPASS GRAFT     CORONARY ARTERY BYPASS GRAFT     x2 using a reverse saphenous vein graft to posterior descending, reseverse saphenous vein graft to lt internal mammary artery at the previous bypass to the circumflex with a no vein harvesting   GANGLION CYST EXCISION Right    Wrist   HEMORRHOID SURGERY     at age 37   INGUINAL HERNIA REPAIR  05/02/1965   PENILE PROSTHESIS IMPLANT  05/03/1987   STERNOTOMY     redo median with aortic valve replacement with a pericardial tissue valve/[employer] model 3000,47mm,serial numver [id number]   TONSILLECTOMY     at age 3   VASECTOMY     at age 69    Allergies  Allergies  Allergen Reactions   Codeine     Hallucinations   Penicillins Hives    Did it involve swelling of the face/tongue/throat, SOB, or low BP? No Did it involve sudden or severe rash/hives, skin  peeling, or any reaction on the inside of your mouth or nose? Yes Did you need to seek medical attention at a hospital or doctor's office? No When did it last happen?     Decades ago  If all above answers are "NO", may proceed with cephalosporin use.      History of Present Illness    MISCHA POLLARD has a PMH of essential hypertension, coronary artery disease, atrial  fibrillation with RVR, carotid artery disease, peripheral vascular disease, acute on chronic diastolic CHF, demand ischemia, CKD stage III, and hyperlipidemia.  He underwent redo CABG and AVR in 2008.  His carotid Dopplers 8/17 showed 40-59% bilateral ICA stenosis.  Repeat Dopplers 2019 showed less than 39% stenosis bilaterally.  His nuclear stress test 5/13 was normal.  His echocardiogram 2012 showed normal LV function and mild aortic stenosis.  He underwent lower extremity arterial Dopplers 8/16 which showed mild vascular disease and adequate ABIs.  He was admitted to the hospital in December with headache.  He was noted to have severe hypertension and mental status changes.  His neurological imaging was negative.  His echocardiogram showed normal LVEF, G1 DD, severe left atrial enlargement and moderate right atrial enlargement.  He was also noted to have well-seated AVR without leak.  His hydralazine was increased from 50 mg to 100 mg 3 times daily.  With the medication increase he developed orthostasis.  He was returned to his previous dosing.  His metoprolol was transitioned to carvedilol.  Amlodipine 2.5 mg was also added to his medication regimen.  He was seen in the emergency department on 06/11/2021 after falling when getting up from a nap.  He had a scalp laceration.  His head CT was negative.  He followed up with Dr. Swaziland 06/17/2021.  During that time he did report falls at home.  He denied syncope.  He noted become short of breath more easily.  He indicated that his legs were weaker.  He denied chest pain.  He was using NSAIDs for his back pain.  He was noted to have chronic lower extremity swelling.  He was unable to put on support hose.  He continued to crochet for church.  He was admitted to the hospital on 07/29/2021.  He was discharged on 08/02/2021.  He was noted to have generalized weakness and dyspnea.  He was diagnosed with decompensated CHF.  He received IV diuresis.  His elevated  troponins were felt to be related to demand ischemia.  His discharge weight was 182 pounds.  His echocardiogram showed preserved EF.  He presents to the clinic today for follow-up evaluation states***  *** denies chest pain, shortness of breath, lower extremity edema, fatigue, palpitations, melena, hematuria, hemoptysis, diaphoresis, weakness, presyncope, syncope, orthopnea, and PND.  Acute on chronic diastolic CHF, elevated troponin-bilateral generalized lower extremity nonpitting edema.  Echocardiogram 07/30/2021 showed an LVEF of 50-55%, intermediate diastolic parameters, severely dilated left atria and mildly dilated right atria. *** denies chest pain, shortness of breath, lower extremity edema, fatigue, palpitations, melena, hematuria, hemoptysis, diaphoresis, weakness, presyncope, syncope, orthopnea, and PND.  Coronary artery disease-no chest pain today.  Underwent redo CABG and AVR 2009. Continue*** Heart healthy low-sodium diet-salty 6 given Increase physical activity as tolerated  Essential hypertension-BP today***.  Well-controlled at home. Continue*** Heart healthy low-sodium diet-salty 6 given Increase physical activity as tolerated  Hyperlipidemia-LDL***. Continue*** Heart healthy low-sodium diet-salty 6 given Increase physical activity as tolerated  CKD stage III-baseline creatinine between 1.3 and 1.5.  Creatinine 09/08/2021  showed 1.58 Follows with PCP  Aortic valve stenosis-status post aortic valve replacement 1/09.  Follow-up echocardiogram showed well-functioning aortic valve. Continue to monitor.  Disposition: Follow-up with Dr. Martinique in 3-4 months. Home Medications    Prior to Admission medications   Medication Sig Start Date End Date Taking? Authorizing Provider  amLODipine (NORVASC) 2.5 MG tablet TAKE 1 TABLET BY MOUTH EVERY DAY 06/23/21   Martinique, Peter M, MD  aspirin (ASPIRIN CHILDRENS) 81 MG chewable tablet Chew 1 tablet (81 mg total) by mouth daily. 01/19/11    Burtis Junes, NP  atorvastatin (LIPITOR) 40 MG tablet TAKE 1 TABLET BY MOUTH EVERY DAY 05/05/21   Martinique, Peter M, MD  carvedilol (COREG) 6.25 MG tablet Take 1 tablet (6.25 mg total) by mouth 2 (two) times daily with a meal. 05/11/21   Haydee Salter, MD  clobetasol cream (TEMOVATE) [id number] % Apply 1 application. topically 2 (two) times daily. 09/08/21   Haydee Salter, MD  COMBIGAN 0.2-0.5 % ophthalmic solution Place 1 drop into the left eye every 12 (twelve) hours.  12/06/12   [provider]  furosemide (LASIX) 20 MG tablet Take 2 tablets (40 mg total) by mouth daily. 05/17/21 05/12/22  Haydee Salter, MD  hydrALAZINE (APRESOLINE) 50 MG tablet Take 1 tablet (50 mg total) by mouth 3 (three) times daily. 05/05/21 08/04/21  Martinique, Peter M, MD  nitroGLYCERIN (NITROSTAT) 0.4 MG SL tablet Place 1 tablet (0.4 mg total) under the tongue every 5 (five) minutes as needed. 07/24/20   Martinique, Peter M, MD  TRAVATAN Z 0.004 % SOLN ophthalmic solution Place 1 drop into the left eye at bedtime.  12/06/12   [provider]    Family History    Family History  Problem Relation Age of Onset   Cancer Father 82       intestinal   Heart attack Mother 84   Stroke Sister    He indicated that his mother is deceased. He indicated that his father is deceased. He indicated that two of his three sisters are alive. He indicated that his brother is deceased. He indicated that his maternal grandmother is deceased. He indicated that his maternal grandfather is deceased. He indicated that his paternal grandmother is deceased. He indicated that his paternal grandfather is deceased. He indicated that his daughter is alive. He indicated that both of his sons are alive.  Social History    Social History   Socioeconomic History   Marital status: Widowed    Spouse name: Not on file   Number of children: 3   Years of education: Not on file   Highest education level: Not on file  Occupational History    Occupation: city of [employer]     Employer: RETIRED    Comment: retired   Occupation: Presenter, broadcasting  Tobacco Use   Smoking status: Former    Packs/day: 1.50    Years: 41.00    Total pack years: 61.50    Types: Cigarettes, Cigars    Quit date: 05/02/1982    Years since quitting: 39.5   Smokeless tobacco: Never  Substance and Sexual Activity   Alcohol use: No   Drug use: No   Sexual activity: Not on file  Other Topics Concern   Not on file  Social History Narrative   Lives alone.  Lives near daughter.    Social Determinants of Health   Financial Resource Strain: Not on file  Food Insecurity: Not on file  Transportation  Needs: Not on file  Physical Activity: Not on file  Stress: Not on file  Social Connections: Not on file  Intimate Partner Violence: Not on file     Review of Systems    General:  No chills, fever, night sweats or weight changes.  Cardiovascular:  No chest pain, dyspnea on exertion, edema, orthopnea, palpitations, paroxysmal nocturnal dyspnea. Dermatological: No rash, lesions/masses Respiratory: No cough, dyspnea Urologic: No hematuria, dysuria Abdominal:   No nausea, vomiting, diarrhea, bright red blood per rectum, melena, or hematemesis Neurologic:  No visual changes, wkns, changes in mental status. All other systems reviewed and are otherwise negative except as noted above.  Physical Exam    VS:  There were no vitals taken for this visit. , BMI There is no height or weight on file to calculate BMI. GEN: Well nourished, well developed, in no acute distress. HEENT: normal. Neck: Supple, no JVD, carotid bruits, or masses. Cardiac: RRR, no murmurs, rubs, or gallops. No clubbing, cyanosis, edema.  Radials/DP/PT 2+ and equal bilaterally.  Respiratory:  Respirations regular and unlabored, clear to auscultation bilaterally. GI: Soft, nontender, nondistended, BS + x 4. MS: no deformity or atrophy. Skin: warm and dry, no rash. Neuro:  Strength and  sensation are intact. Psych: Normal affect.  Accessory Clinical Findings    Recent Labs: 07/29/2021: ALT 12; B Natriuretic Peptide 1,087.8; TSH 3.515 08/01/2021: Magnesium 2.2 09/08/2021: BUN 49; Creatinine, Ser 1.58; Hemoglobin 10.9; Platelets 161.0; Potassium 5.9 No hemolysis seen; Sodium 139   Recent Lipid Panel    Component Value Date/Time   CHOL 162 09/08/2021 0858   CHOL 159 06/08/2020 0913   TRIG 178.0 (H) 09/08/2021 0858   HDL 52.40 09/08/2021 0858   HDL 47 06/08/2020 0913   CHOLHDL 3 09/08/2021 0858   VLDL 35.6 09/08/2021 0858   LDLCALC 74 09/08/2021 0858   LDLCALC 90 06/08/2020 0913    ECG personally reviewed by me today- *** - No acute changes  IMPRESSIONS     1. Left ventricular ejection fraction, by estimation, is 50 to 55%. The  left ventricle has low normal function. The left ventricle has no regional  wall motion abnormalities. There is moderate concentric left ventricular  hypertrophy. Left ventricular  diastolic parameters are indeterminate.   2. Right ventricular systolic function is moderately reduced. The right  ventricular size is moderately enlarged. There is normal pulmonary artery  systolic pressure.   3. Left atrial size was severely dilated.   4. Right atrial size was mildly dilated.   5. The mitral valve is grossly normal. Mild mitral valve regurgitation.  No evidence of mitral stenosis. Severe mitral annular calcification.   6. The aortic valve has been repaired/replaced. Aortic valve  regurgitation is not visualized. There is a 23 mm Edwards bovine valve  present in the aortic position. Procedure Date: 2009. Echo findings are  consistent with normal structure and function of  the aortic valve prosthesis. Aortic valve mean gradient measures 9.0 mmHg.   7. The inferior vena cava is dilated in size with >50% respiratory  variability, suggesting right atrial pressure of 8 mmHg.   Comparison(s): No significant change from prior study.    Conclusion(s)/Recommendation(s): Otherwise normal echocardiogram, with  minor abnormalities described in the report.   FINDINGS   Left Ventricle: Left ventricular ejection fraction, by estimation, is 50  to 55%. The left ventricle has low normal function. The left ventricle has  no regional wall motion abnormalities. The left ventricular internal  cavity size was normal  in size.  There is moderate concentric left ventricular hypertrophy. Left  ventricular diastolic parameters are indeterminate.   Right Ventricle: The right ventricular size is moderately enlarged. Right  vetricular wall thickness was not well visualized. Right ventricular  systolic function is moderately reduced. There is normal pulmonary artery  systolic pressure. The tricuspid  regurgitant velocity is 1.74 m/s, and with an assumed right atrial  pressure of 8 mmHg, the estimated right ventricular systolic pressure is  [id number] mmHg.   Left Atrium: Left atrial size was severely dilated.   Right Atrium: Right atrial size was mildly dilated.   Pericardium: There is no evidence of pericardial effusion.   Mitral Valve: The mitral valve is grossly normal. Severe mitral annular  calcification. Mild mitral valve regurgitation. No evidence of mitral  valve stenosis.   Tricuspid Valve: The tricuspid valve is grossly normal. Tricuspid valve  regurgitation is trivial. No evidence of tricuspid stenosis.   Aortic Valve: The aortic valve has been repaired/replaced. Aortic valve  regurgitation is not visualized. Aortic valve mean gradient measures 9.0  mmHg. Aortic valve peak gradient measures 16.3 mmHg. Aortic valve area, by  VTI measures 1.14 cm. There is a  23 mm Edwards bovine valve present in the aortic position. Procedure Date:  2009. Echo findings are consistent with normal structure and function of  the aortic valve prosthesis.   Pulmonic Valve: The pulmonic valve was not well visualized. Pulmonic valve   regurgitation is not visualized. No evidence of pulmonic stenosis.   Aorta: The aortic root and ascending aorta are structurally normal, with  no evidence of dilitation.   Venous: The inferior vena cava is dilated in size with greater than 50%  respiratory variability, suggesting right atrial pressure of 8 mmHg.   IAS/Shunts: The interatrial septum was not well visualized.   Assessment & Plan   1.  ***   Jossie Ng. Ardice Boyan NP-C     11/07/2021, [facility] [place] [address] Office [phone] Fax [phone]  Notice: This dictation was prepared with Dragon dictation along with smaller phrase technology. Any transcriptional errors that result from this process are unintentional and may not be corrected upon review.  I spent***minutes examining this patient, reviewing medications, and using patient centered shared decision making involving her cardiac care.  Prior to her visit I spent greater than 20 minutes reviewing her past medical history,  medications, and prior cardiac tests.

## 2021-11-09 ENCOUNTER — Encounter: Payer: Self-pay | Admitting: General Practice

## 2021-11-09 ENCOUNTER — Ambulatory Visit: Payer: PPO | Admitting: General Practice

## 2021-11-09 VITALS — BP 124/50 | HR 73 | Ht 66.0 in | Wt 174.8 lb

## 2021-11-09 DIAGNOSIS — I25119 Atherosclerotic heart disease of native coronary artery with unspecified angina pectoris: Secondary | ICD-10-CM

## 2021-11-09 DIAGNOSIS — E7800 Pure hypercholesterolemia, unspecified: Secondary | ICD-10-CM | POA: Diagnosis not present

## 2021-11-09 DIAGNOSIS — I5033 Acute on chronic diastolic (congestive) heart failure: Secondary | ICD-10-CM

## 2021-11-09 DIAGNOSIS — Z953 Presence of xenogenic heart valve: Secondary | ICD-10-CM

## 2021-11-09 DIAGNOSIS — I10 Essential (primary) hypertension: Secondary | ICD-10-CM | POA: Diagnosis not present

## 2021-11-09 DIAGNOSIS — N183 Chronic kidney disease, stage 3 unspecified: Secondary | ICD-10-CM

## 2021-11-09 NOTE — Patient Instructions (Signed)
Medication Instructions:  The current medical regimen is effective;  continue present plan and medications as directed. Please refer to the Current Medication list given to you today.   *If you need a refill on your cardiac medications before your next appointment, please call your pharmacy*  Lab Work:   Testing/Procedures:  NONE    NONE  If you have labs (blood work) drawn today and your tests are completely normal, you will receive your results only by: MyChart Message (if you have MyChart) OR  A paper copy in the mail If you have any lab test that is abnormal or we need to change your treatment, we will call you to review the results.  Special Instructions PLEASE READ AND FOLLOW SALTY 6-ATTACHED-1,800mg  daily  PLEASE MAINTAIN PHYSICAL ACTIVITY AS TOLERATED   Follow-Up: Your next appointment:  6 month(s) In Person with Peter Swaziland, MD    Please call our office 2 months in advance to schedule this appointment  :1  At [facility], you and your health needs are our priority.  As part of our continuing mission to provide you with exceptional heart care, we have created designated Provider Care Teams.  These Care Teams include your primary Cardiologist (physician) and Advanced Practice Providers (APPs -  Physician Assistants and Nurse Practitioners) who all work together to provide you with the care you need, when you need it.  Important Information About Sugar             6 SALTY THINGS TO AVOID     1,800MG  DAILY

## 2021-12-10 ENCOUNTER — Encounter: Payer: Self-pay | Admitting: Family Medicine

## 2021-12-10 ENCOUNTER — Ambulatory Visit (INDEPENDENT_AMBULATORY_CARE_PROVIDER_SITE_OTHER): Payer: PPO | Admitting: Family Medicine

## 2021-12-10 VITALS — BP 126/66 | HR 68 | Temp 97.8°F | Ht 66.0 in | Wt 176.2 lb

## 2021-12-10 DIAGNOSIS — B354 Tinea corporis: Secondary | ICD-10-CM

## 2021-12-10 DIAGNOSIS — I5033 Acute on chronic diastolic (congestive) heart failure: Secondary | ICD-10-CM

## 2021-12-10 DIAGNOSIS — I25119 Atherosclerotic heart disease of native coronary artery with unspecified angina pectoris: Secondary | ICD-10-CM

## 2021-12-10 DIAGNOSIS — E782 Mixed hyperlipidemia: Secondary | ICD-10-CM

## 2021-12-10 DIAGNOSIS — I10 Essential (primary) hypertension: Secondary | ICD-10-CM | POA: Diagnosis not present

## 2021-12-10 DIAGNOSIS — I4891 Unspecified atrial fibrillation: Secondary | ICD-10-CM

## 2021-12-10 MED ORDER — CLOTRIMAZOLE 1 % EX CREA: 1.0000 | TOPICAL_CREAM | Freq: Two times a day (BID) | CUTANEOUS | 0 refills | Status: DC

## 2021-12-10 NOTE — Progress Notes (Signed)
[facility] [address] [place] [address] Dept: [phone] Dept Fax: [phone]  Chronic Care Office Visit  Subjective:    Patient ID: [name], male    DOB: 09-Feb-1925, 88 y.o..   MRN: [id number]  Chief Complaint  Patient presents with   Follow-up    3 month f/u. No concerns.  Not fasting today.     History of Present Illness:  Patient is in today for reassessment of chronic medical issues.  [name] has a history of CAD and chronic diastolic heart failure. He had an episode of acute heart failure in early March. He has recovered well since then. He dyspnea as long as he paces himself. He does still get out and use his mower to keep up with 6 acres of grass. His heart failure is managed on carvedilol 6.25 mg bid and furosemide 20 mg daily. He is not noting any significant increase in edema.   [name] has a history of hypertension. He is currently on amlodipine  2.5 mg daily and hydralazine 50 mg TID.   [name] has a history of hyperlipidemia. He is managed on atorvastatin 40 mg daily.   [name] notes he has a long standing area of rash on his left arm. This is pruritic at times. He had been using clobetasol. He notes this improved the rash, but did not resolve this. He has not used this for the past month.   Past Medical History: Patient Active Problem List   Diagnosis Date Noted   Chronic diastolic heart failure (HCC) 09/08/2021   Acute on chronic diastolic CHF (congestive heart failure) (HCC) 07/29/2021   Elevated troponin 07/29/2021   Normocytic anemia 07/29/2021   Weakness 07/29/2021   Demand ischemia (HCC)    Pedal edema 04/16/2021   CKD (chronic kidney disease), stage III (HCC) 04/09/2021   Glaucoma 11/30/2020   Carotid artery disease (HCC) 11/12/2013   Claudication (HCC) 12/12/2012   Peripheral vascular disease (HCC) 12/12/2012   Carotid bruit 08/23/2011   Atrial fibrillation  with RVR (HCC) 01/12/2011   Essential hypertension    Hyperlipidemia    Coronary artery disease    Status post aortic valve replacement with tissue    Past Surgical History:  Procedure Laterality Date   APPENDECTOMY     CARDIAC CATHETERIZATION  04/02/2007   EF 60%/severe two-vessel obstructive coronary artery disease/patent saphenous bein graft to rt coronary/ severe stenosis at the ostium of the lt internal mammary artery graft to the abtuse marginal branch/normal lt ventricular function/ severe aortic stenosis/normal rt heart pressures   CARDIAC CATHETERIZATION  07/25/2002   EF 65%/two-vessel obstructive atherosclerotic coronary artery disease/ patent saphenous vein graft to the distal rt coronary arter//patent lt internal mammary artery graft to the obtuse marginal branch/normal lt ventricular function   CARDIAC CATHETERIZATION  05/17/1991   EF65%/two-vessel ovstructive atherosclerotic coronary artery disease/good lt ventricular performance/compared with previous catheterization there is now total occlusion of the tr coronary artery at the prior angioplasty site/good collateral flow to the distal rt coronary artery is seen/there is also progressive disease in the lt circumflex vessel now to obstructive levels   CARDIAC CATHETERIZATION  07/26/1990   EF60%/borderline obstructive coronary artery disease in the mid lt circumflex & in the rt coronary artery prior angioplasty site/normal lt ventricular function   CARDIAC CATHETERIZATION  05/23/1990   successful percutaneous transluminal coronary angioplasty of the proximal rt coronary artery   CATARACT EXTRACTION W/ INTRAOCULAR LENS IMPLANT Bilateral  CORONARY ANGIOPLASTY  07/12/1990   single vessel obstructive atherosclerotic coronary atrery disease in the rt corornary artery with restenosis of the primary angioplasty site/successful repeat PTCA of the proximal rt coronary arter   CORONARY ARTERY BYPASS GRAFT     CORONARY ARTERY BYPASS GRAFT      x2 using a reverse saphenous vein graft to posterior descending, reseverse saphenous vein graft to lt internal mammary artery at the previous bypass to the circumflex with a no vein harvesting   GANGLION CYST EXCISION Right    Wrist   HEMORRHOID SURGERY     at age 3   [place]  05/02/1965   PENILE PROSTHESIS IMPLANT  05/03/1987   STERNOTOMY     redo median with aortic valve replacement with a pericardial tissue valve/[employer] model 3000,69mm,serial numver [id number]   TONSILLECTOMY     at age 76   VASECTOMY     at age 67   Family History  Problem Relation Age of Onset   Cancer Father 80       intestinal   Heart attack Mother 110   Stroke Sister    Outpatient Medications Prior to Visit  Medication Sig Dispense Refill   amLODipine (NORVASC) 2.5 MG tablet TAKE 1 TABLET BY MOUTH EVERY DAY 30 tablet 6   aspirin (ASPIRIN CHILDRENS) 81 MG chewable tablet Chew 1 tablet (81 mg total) by mouth daily. 36 tablet 11   atorvastatin (LIPITOR) 40 MG tablet TAKE 1 TABLET BY MOUTH EVERY DAY 90 tablet 3   carvedilol (COREG) 6.25 MG tablet Take 1 tablet (6.25 mg total) by mouth 2 (two) times daily with a meal. 180 tablet 3   COMBIGAN 0.2-0.5 % ophthalmic solution Place 1 drop into the left eye every 12 (twelve) hours.      furosemide (LASIX) 20 MG tablet Take 2 tablets (40 mg total) by mouth daily. 180 tablet 3   hydrALAZINE (APRESOLINE) 50 MG tablet Take 1 tablet (50 mg total) by mouth 3 (three) times daily. 270 tablet 3   nitroGLYCERIN (NITROSTAT) 0.4 MG SL tablet Place 1 tablet (0.4 mg total) under the tongue every 5 (five) minutes as needed. 25 tablet 1   TRAVATAN Z 0.004 % SOLN ophthalmic solution Place 1 drop into the left eye at bedtime.      clobetasol cream (TEMOVATE) [id number] % Apply 1 application. topically 2 (two) times daily. 30 g 0   No facility-administered medications prior to visit.   Allergies  Allergen Reactions   Codeine     Hallucinations   Penicillins  Hives    Did it involve swelling of the face/tongue/throat, SOB, or low BP? No Did it involve sudden or severe rash/hives, skin peeling, or any reaction on the inside of your mouth or nose? Yes Did you need to seek medical attention at a hospital or doctor's office? No When did it last happen?     Decades ago  If all above answers are "NO", may proceed with cephalosporin use.       Objective:   Today's Vitals   12/10/21 0850  BP: 126/66  Pulse: 68  Temp: 97.8 F (36.6 C)  TempSrc: Temporal  SpO2: 96%  Weight: 176 lb 3.2 oz (79.9 kg)  Height: 5\' 6"  (1.676 m)   Body mass index is 28.44 kg/m.   General: Well developed, well nourished. No acute distress. Lungs: Clear to auscultation bilaterally. No wheezing, rales or rhonchi. CV: RRR without murmurs or rubs. Pulses 2+ bilaterally. Extremities: 1+  edema noted. Skin: Warm and dry. The rash on the inner aspect of the left elbow has changed in appearance. Now it has a   well-demarcated, slightly raised border, with some mild scaliness inside, but areas of clearing. Psych: Alert and oriented. Normal mood and affect.  Health Maintenance Due  Topic Date Due   Zoster Vaccines- Shingrix (1 of 2) Never done   Pneumonia Vaccine 41+ Years old (2 - PPSV23 or PCV20) 02/13/2019   INFLUENZA VACCINE  11/30/2021     Assessment & Plan:   1. Acute on chronic diastolic CHF (congestive heart failure) (HCC) Compensated and stable. We will continue to keep an eye on this for any sign of decompensation. I reminded [name] of the importance of elevating his legs some daily.  2. Essential hypertension Blood pressure is at goal. Continue amlodipine 2.5 mg daily and hydralazine 50 mg three times a day.  3. Coronary artery disease involving native coronary artery of native heart with angina pectoris (HCC) Stable. Continue daily aspirin.  4. Atrial fibrillation with RVR (HCC) Appears to be in sinus rhythm currently. We will monitor.  5. Mixed  hyperlipidemia Lipids have been at goal on atorvastatin 40 mg daily.  6. Tinea corporis The rash on the inner elbow looks consistent with tinea corporis, and not a simple eczema. I will try him on clotrimazole cream to see if we can clear this up.  - clotrimazole (CLOTRIMAZOLE AF) 1 % cream; Apply 1 Application topically 2 (two) times daily.  Dispense: 30 g; Refill: 0   Return in about 6 months (around 06/12/2022) for Reassessment.   Loyola Mast, MD

## 2021-12-15 ENCOUNTER — Other Ambulatory Visit: Payer: Self-pay | Admitting: Cardiology

## 2022-01-06 ENCOUNTER — Other Ambulatory Visit: Payer: Self-pay | Admitting: Family Medicine

## 2022-01-06 DIAGNOSIS — L309 Dermatitis, unspecified: Secondary | ICD-10-CM

## 2022-01-22 ENCOUNTER — Other Ambulatory Visit: Payer: Self-pay | Admitting: Family Medicine

## 2022-01-22 DIAGNOSIS — L309 Dermatitis, unspecified: Secondary | ICD-10-CM

## 2022-02-08 ENCOUNTER — Other Ambulatory Visit: Payer: Self-pay

## 2022-02-08 MED ORDER — NITROGLYCERIN 0.4 MG SL SUBL: 0.4000 mg | SUBLINGUAL_TABLET | SUBLINGUAL | 1 refills | Status: AC | PRN

## 2022-02-14 ENCOUNTER — Encounter: Payer: Self-pay | Admitting: Family Medicine

## 2022-02-14 NOTE — Telephone Encounter (Signed)
Please review and advise if patient is due for another Pneumonia vaccine.   Thanks. Dm/cma

## 2022-02-18 ENCOUNTER — Encounter: Payer: Self-pay | Admitting: Family Medicine

## 2022-04-25 ENCOUNTER — Other Ambulatory Visit: Payer: Self-pay | Admitting: Cardiology

## 2022-04-25 ENCOUNTER — Other Ambulatory Visit: Payer: Self-pay | Admitting: Family Medicine

## 2022-04-28 ENCOUNTER — Other Ambulatory Visit: Payer: Self-pay | Admitting: Cardiology

## 2022-05-23 IMAGING — CT CT HEAD W/O CM
3 series · 16 of 37 positions shown, 18 images · non-contrast
Comparison: None.

CLINICAL DATA: Headache

EXAM:
CT HEAD WITHOUT CONTRAST
TECHNIQUE: Contiguous axial images were obtained from the base of the skull
through the vertex without intravenous contrast.

[Series 3: head without · axial · non-contrast · 0.46mm/px · z∈[-141,-6]mm · 7 of 37 slices shown, 9 images]
[im 5/37  brain]
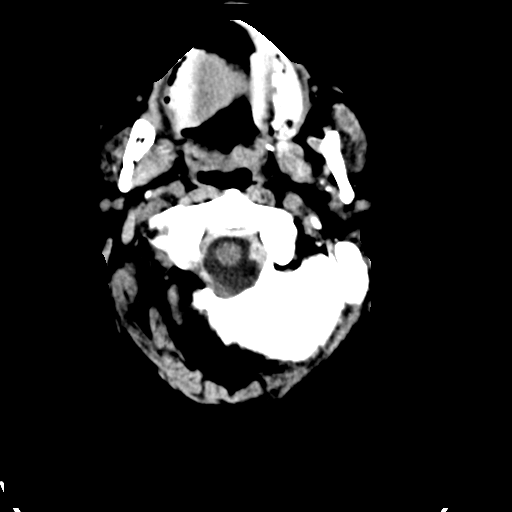
[im 5/37  bone]
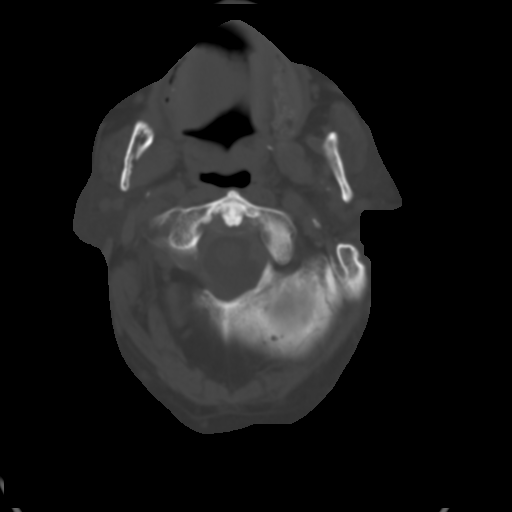
[im 10/37  brain]
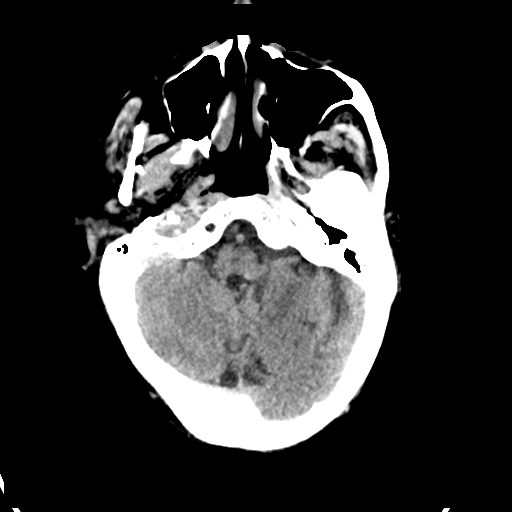
[im 14/37  brain]
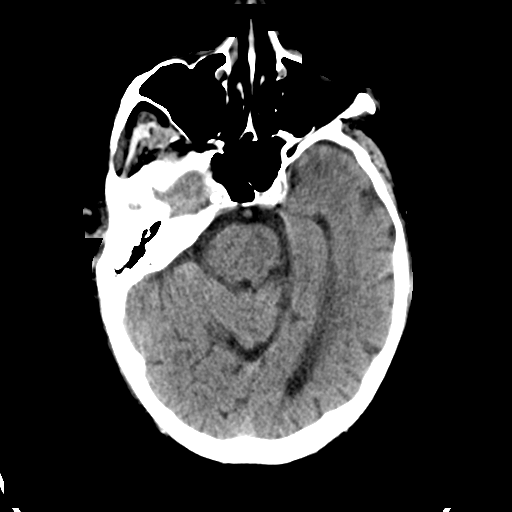
[im 19/37  brain]
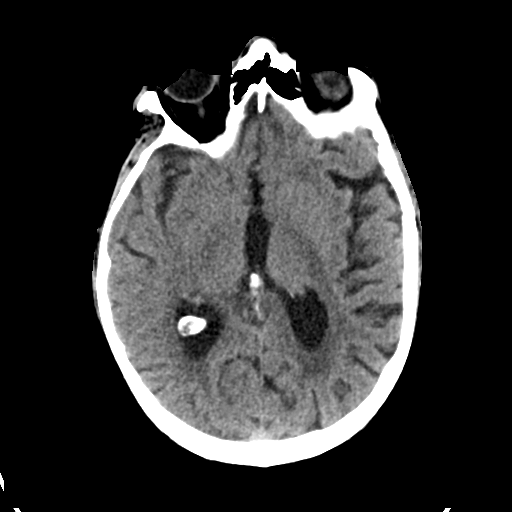
[im 23/37  brain]
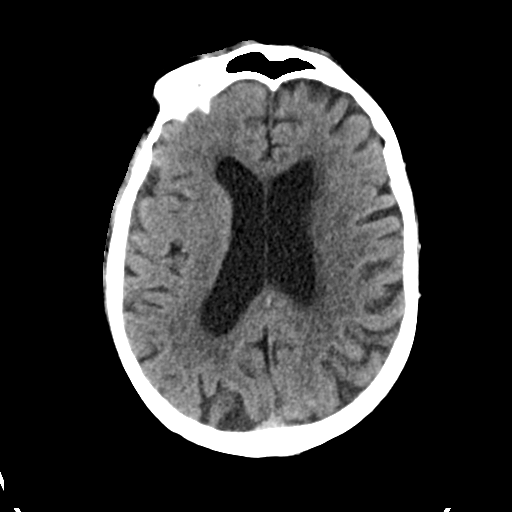
[im 23/37  bone]
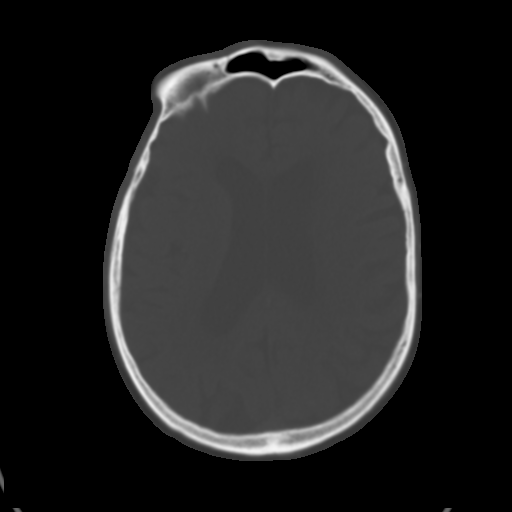
[im 28/37  brain]
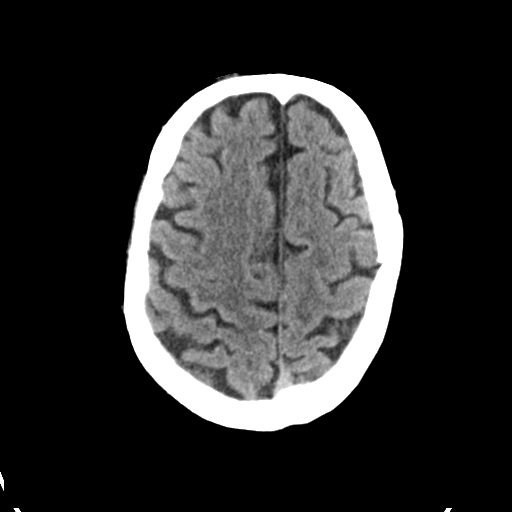
[im 32/37  brain]
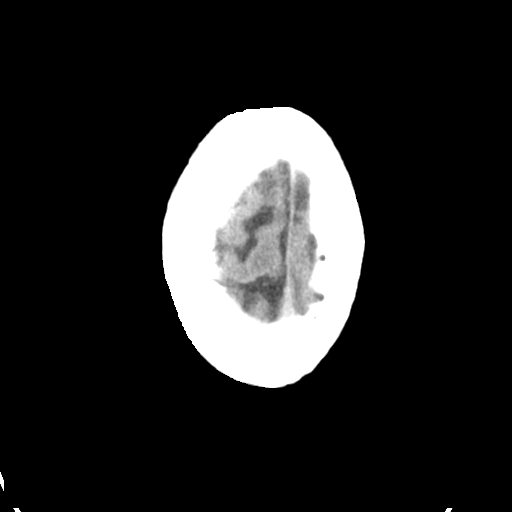

[Series 4: head bone · axial · 0.46mm/px · z∈[-143,-35]mm · 6 of 91 slices shown]
[im 10/91  bone]
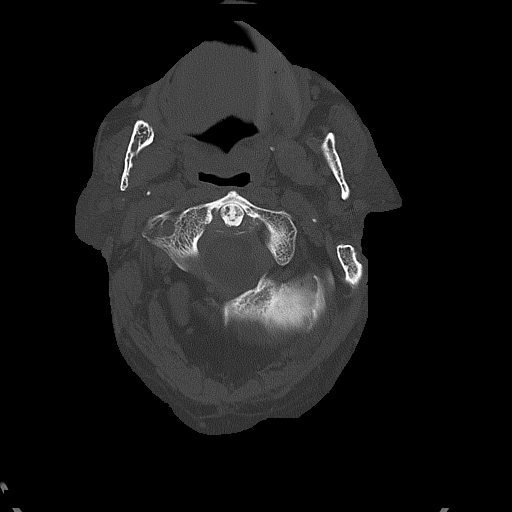
[im 19/91  bone]
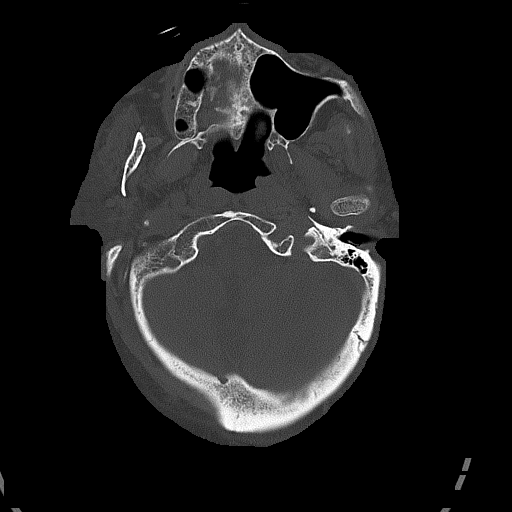
[im 28/91  bone]
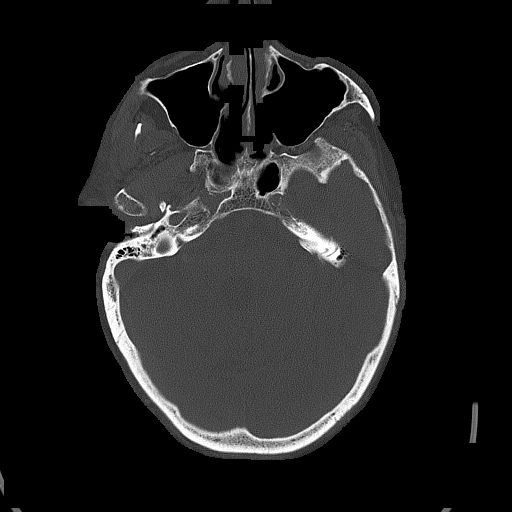
[im 41/91  bone]
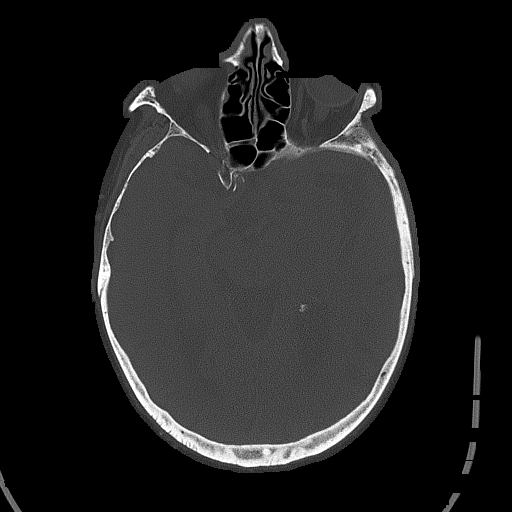
[im 50/91  bone]
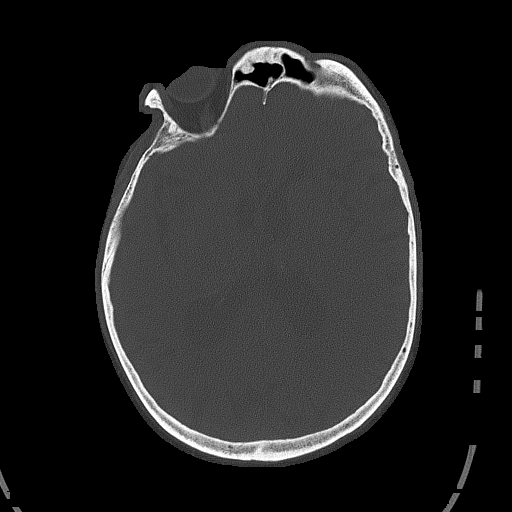
[im 64/91  bone]
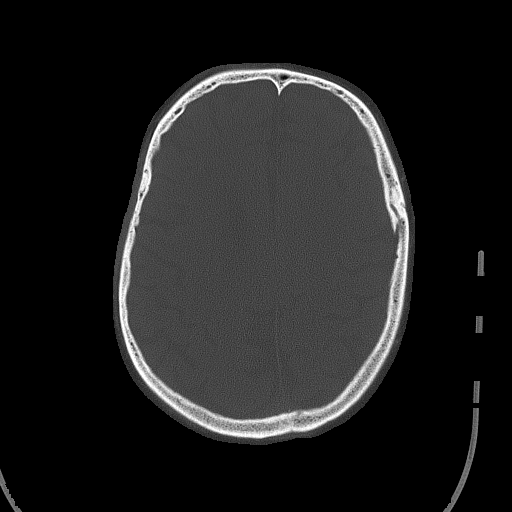

[Series 6: head without sag · sagittal · non-contrast · 0.35mm/px · 3 of 65 slices shown]
[im 28/65  brain]
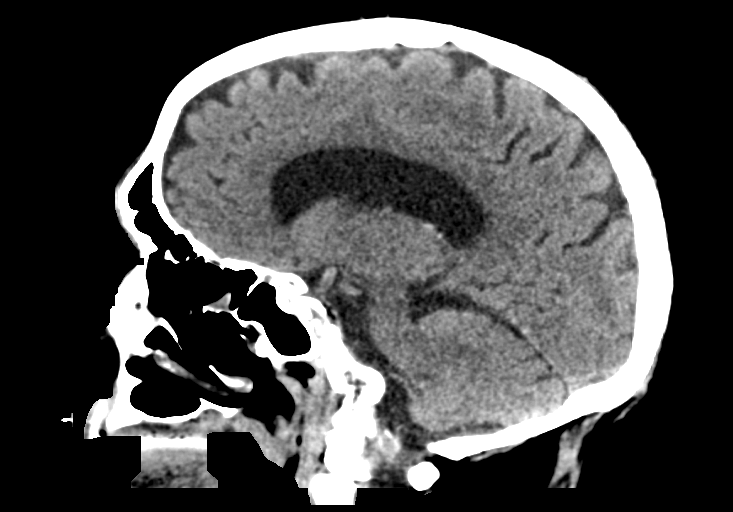
[im 33/65  brain]
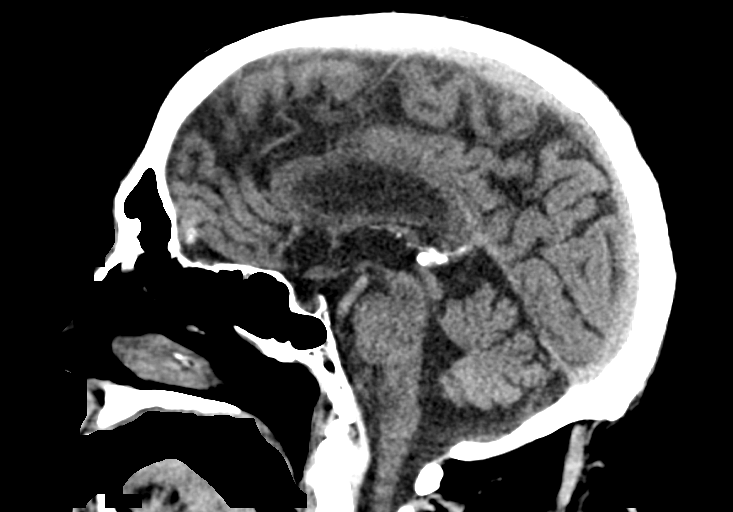
[im 38/65  brain]
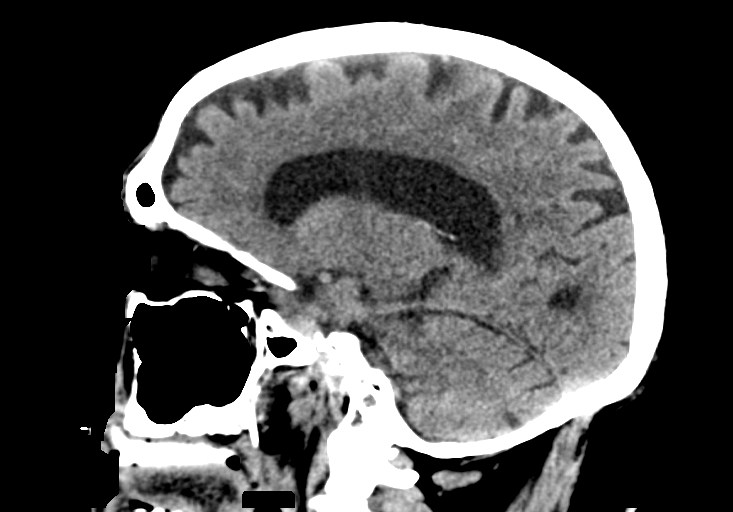

[16 of 37 positions shown; findings below may reference images not displayed]

FINDINGS: Brain: No acute intracranial hemorrhage, mass effect, or herniation.
No extra-axial fluid collections. No evidence of acute territorial
infarct. No hydrocephalus. Mild cortical volume loss. Patchy
hypodensities throughout the periventricular and subcortical white
matter, likely secondary to chronic microvascular ischemic changes.

Vascular: Calcified plaques in the carotid siphons.

Skull: Normal. Negative for fracture or focal lesion.

Sinuses/Orbits: No acute finding.

Other: None.
IMPRESSION: Chronic changes with no acute intracranial process identified.

## 2022-05-24 ENCOUNTER — Other Ambulatory Visit: Payer: Self-pay | Admitting: Family Medicine

## 2022-05-24 ENCOUNTER — Other Ambulatory Visit: Payer: Self-pay | Admitting: Cardiology

## 2022-05-24 DIAGNOSIS — B354 Tinea corporis: Secondary | ICD-10-CM

## 2022-06-21 ENCOUNTER — Ambulatory Visit (INDEPENDENT_AMBULATORY_CARE_PROVIDER_SITE_OTHER): Payer: PPO | Admitting: Family Medicine

## 2022-06-21 ENCOUNTER — Encounter: Payer: Self-pay | Admitting: Family Medicine

## 2022-06-21 VITALS — BP 124/66 | HR 67 | Temp 98.1°F | Ht 66.0 in | Wt 182.6 lb

## 2022-06-21 DIAGNOSIS — N183 Chronic kidney disease, stage 3 unspecified: Secondary | ICD-10-CM | POA: Diagnosis not present

## 2022-06-21 DIAGNOSIS — I5032 Chronic diastolic (congestive) heart failure: Secondary | ICD-10-CM

## 2022-06-21 DIAGNOSIS — M5442 Lumbago with sciatica, left side: Secondary | ICD-10-CM

## 2022-06-21 DIAGNOSIS — I25119 Atherosclerotic heart disease of native coronary artery with unspecified angina pectoris: Secondary | ICD-10-CM | POA: Diagnosis not present

## 2022-06-21 DIAGNOSIS — G8929 Other chronic pain: Secondary | ICD-10-CM

## 2022-06-21 DIAGNOSIS — I10 Essential (primary) hypertension: Secondary | ICD-10-CM

## 2022-06-21 DIAGNOSIS — E782 Mixed hyperlipidemia: Secondary | ICD-10-CM

## 2022-06-21 NOTE — Assessment & Plan Note (Signed)
Weight is up about 6 lbs from Aug. However, he is showing stable lower leg edema and no increased dyspnea. I feel his HFpEF remains compensated. Continue carvedilol 6.25 mg bid and furosemide 20 mg daily.

## 2022-06-21 NOTE — Progress Notes (Signed)
[facility] [address] [place] [address] Dept: [phone] Dept Fax: [phone]  Chronic Care Office Visit  Subjective:    Patient ID: [name], male    DOB: [birth year], 89 y.o..   MRN: [id number]  Chief Complaint  Patient presents with   Medical Management of Chronic Issues    6 month f/u.  C/o having back pain radiating down leg x 1 month.  Has been taking Tylenol.    History of Present Illness:  Patient is in today for reassessment of chronic medical issues.  [name] has a history of CAD and chronic diastolic heart failure. His heart failure is managed on carvedilol 6.25 mg bid and furosemide 20 mg daily. He is not noting any significant increase in edema. He has some chronic dyspnea, but this has not been worse recently. He knows he is to avoid NSAIDs.   [name] has a history of hypertension. He is currently on amlodipine  2.5 mg daily and hydralazine 50 mg TID.   [name] has a history of hyperlipidemia. He is managed on atorvastatin 40 mg daily.  [name] notes that he has been having issues with low back pain and left sided sciatica. He finds this is most bothersome first think in the morning. After he gets up and takes his medication, this seems to do better.  Past Medical History: Patient Active Problem List   Diagnosis Date Noted   Chronic left-sided low back pain with left-sided sciatica 06/21/2022   Chronic diastolic heart failure ([place]) 09/08/2021   Acute on chronic diastolic CHF (congestive heart failure) ([place]) 07/29/2021   Elevated troponin 07/29/2021   Normocytic anemia 07/29/2021   Weakness 07/29/2021   Demand ischemia    Pedal edema 04/16/2021   CKD (chronic kidney disease), stage III ([place]) 04/09/2021   Glaucoma 11/30/2020   Carotid artery disease ([place]) 11/12/2013   Claudication ([place]) 12/12/2012   Peripheral vascular disease ([place]) 12/12/2012   Carotid bruit 08/23/2011    Atrial fibrillation with RVR ([place]) 01/12/2011   Essential hypertension    Hyperlipidemia    Coronary artery disease    Status post aortic valve replacement with tissue    Past Surgical History:  Procedure Laterality Date   APPENDECTOMY     CARDIAC CATHETERIZATION  04/02/2007   EF 60%/severe two-vessel obstructive coronary artery disease/patent saphenous bein graft to rt coronary/ severe stenosis at the ostium of the lt internal mammary artery graft to the abtuse marginal branch/normal lt ventricular function/ severe aortic stenosis/normal rt heart pressures   CARDIAC CATHETERIZATION  07/25/2002   EF 65%/two-vessel obstructive atherosclerotic coronary artery disease/ patent saphenous vein graft to the distal rt coronary arter//patent lt internal mammary artery graft to the obtuse marginal branch/normal lt ventricular function   CARDIAC CATHETERIZATION  05/17/1991   EF65%/two-vessel ovstructive atherosclerotic coronary artery disease/good lt ventricular performance/compared with previous catheterization there is now total occlusion of the tr coronary artery at the prior angioplasty site/good collateral flow to the distal rt coronary artery is seen/there is also progressive disease in the lt circumflex vessel now to obstructive levels   CARDIAC CATHETERIZATION  07/26/1990   EF60%/borderline obstructive coronary artery disease in the mid lt circumflex & in the rt coronary artery prior angioplasty site/normal lt ventricular function   CARDIAC CATHETERIZATION  05/23/1990   successful percutaneous transluminal coronary angioplasty of the proximal rt coronary artery   CATARACT EXTRACTION W/ INTRAOCULAR LENS IMPLANT Bilateral    CORONARY ANGIOPLASTY  07/12/1990  single vessel obstructive atherosclerotic coronary atrery disease in the rt corornary artery with restenosis of the primary angioplasty site/successful repeat PTCA of the proximal rt coronary arter   CORONARY ARTERY BYPASS GRAFT     CORONARY  ARTERY BYPASS GRAFT     x2 using a reverse saphenous vein graft to posterior descending, reseverse saphenous vein graft to lt internal mammary artery at the previous bypass to the circumflex with a no vein harvesting   GANGLION CYST EXCISION Right    Wrist   HEMORRHOID SURGERY     at age 89   [place]  05/02/1965   PENILE PROSTHESIS IMPLANT  05/03/1987   STERNOTOMY     redo median with aortic valve replacement with a pericardial tissue valve/[employer] model 3000,71m,serial numver [id number]  TONSILLECTOMY     at age 89  VASECTOMY     at age 89  Family History  Problem Relation Age of Onset   Cancer Father 723      intestinal   Heart attack Mother 857  Stroke Sister    Outpatient Medications Prior to Visit  Medication Sig Dispense Refill   amLODipine (NORVASC) 2.5 MG tablet TAKE 1 TABLET BY MOUTH EVERY DAY 90 tablet 3   aspirin (ASPIRIN CHILDRENS) 81 MG chewable tablet Chew 1 tablet (81 mg total) by mouth daily. 36 tablet 11   atorvastatin (LIPITOR) 40 MG tablet TAKE 1 TABLET BY MOUTH EVERY DAY 90 tablet 2   carvedilol (COREG) 6.25 MG tablet TAKE 1 TABLET BY MOUTH 2 TIMES DAILY WITH A MEAL. 180 tablet 3   clobetasol cream (TEMOVATE) 0AB-123456789% APPLY 1 APPLICATION. TOPICALLY 2 (TWO) TIMES DAILY. 30 g 0   clotrimazole (LOTRIMIN) 1 % cream APPLY TO AFFECTED AREA TWICE A DAY 30 g 0   COMBIGAN 0.2-0.5 % ophthalmic solution Place 1 drop into the left eye every 12 (twelve) hours.      furosemide (LASIX) 20 MG tablet Take 2 tablets (40 mg total) by mouth daily. 180 tablet 3   furosemide (LASIX) 40 MG tablet Take 1 tablet (40 mg total) by mouth daily. 90 tablet 1   hydrALAZINE (APRESOLINE) 50 MG tablet Take 1 tablet (50 mg total) by mouth 3 (three) times daily. 270 tablet 1   latanoprost (XALATAN) 0.005 % ophthalmic solution SMARTSIG:In Eye(s)     nitroGLYCERIN (NITROSTAT) 0.4 MG SL tablet Place 1 tablet (0.4 mg total) under the tongue every 5 (five) minutes as needed.  25 tablet 1   TRAVATAN Z 0.004 % SOLN ophthalmic solution Place 1 drop into the left eye at bedtime.      No facility-administered medications prior to visit.   Allergies  Allergen Reactions   Codeine     Hallucinations   Penicillins Hives    Did it involve swelling of the face/tongue/throat, SOB, or low BP? No Did it involve sudden or severe rash/hives, skin peeling, or any reaction on the inside of your mouth or nose? Yes Did you need to seek medical attention at a hospital or doctor's office? No When did it last happen?     Decades ago  If all above answers are "NO", may proceed with cephalosporin use.     Objective:   Today's Vitals   06/21/22 0835  BP: 124/66  Pulse: 67  Temp: 98.1 F (36.7 C)  TempSrc: Temporal  SpO2: 97%  Weight: 182 lb 9.6 oz (82.8 kg)  Height: 5' 6"$  (1.676 m)   Body  mass index is 29.47 kg/m.   General: Well developed, well nourished. No acute distress. CV: RRR without murmurs or rubs. Pulses 2+ bilaterally. Back: Straight. Mild tenderness over the left lower back, near the pelvis. Extremities: 1+ edema noted. Psych: Alert and oriented. Normal mood and affect.  Health Maintenance Due  Topic Date Due   Medicare Annual Wellness (AWV)  Never done   Zoster Vaccines- Shingrix (1 of 2) Never done     Assessment & Plan:   Problem List Items Addressed This Visit       Cardiovascular and Mediastinum   Essential hypertension    Blood pressure is in good control. Continue amlodipine  2.5 mg daily and hydralazine 50 mg TID.       Coronary artery disease    Stable. No current angina.      Chronic diastolic heart failure ([place]) - Primary    Weight is up about 6 lbs from Aug. However, he is showing stable lower leg edema and no increased dyspnea. I feel his HFpEF remains compensated. Continue carvedilol 6.25 mg bid and furosemide 20 mg daily.        Nervous and Auditory   Chronic left-sided low back pain with left-sided sciatica    I  recommend he continue to use Tylenol as needed for pain. We discussed possible referral to orthopedics to consider an ESI. He prefers to monitor this for now.        Genitourinary   CKD (chronic kidney disease), stage III ([place])    Blood pressure is at goal. We will check annual labs at his next visit.        Other   Hyperlipidemia    Continue atorvastatin 40 mg daily. We will check fasting lipids at his next visit.       Return in about 3 months (around 09/19/2022) for Reassessment.   Haydee Salter, MD

## 2022-06-21 NOTE — Assessment & Plan Note (Signed)
Stable. No current angina.

## 2022-06-21 NOTE — Assessment & Plan Note (Signed)
I recommend he continue to use Tylenol as needed for pain. We discussed possible referral to orthopedics to consider an ESI. He prefers to monitor this for now.

## 2022-06-21 NOTE — Assessment & Plan Note (Signed)
Blood pressure is in good control. Continue amlodipine  2.5 mg daily and hydralazine 50 mg TID.

## 2022-06-21 NOTE — Assessment & Plan Note (Signed)
Continue atorvastatin 40 mg daily. We will check fasting lipids at his next visit.

## 2022-06-21 NOTE — Assessment & Plan Note (Signed)
Blood pressure is at goal. We will check annual labs at his next visit.

## 2022-08-17 ENCOUNTER — Other Ambulatory Visit: Payer: Self-pay

## 2022-08-17 ENCOUNTER — Emergency Department (HOSPITAL_COMMUNITY)
Admission: EM | Admit: 2022-08-17 | Discharge: 2022-08-17 | Disposition: A | Discharge status: Home / Self Care | Payer: PPO | Attending: Emergency Medicine | Admitting: Emergency Medicine

## 2022-08-17 ENCOUNTER — Emergency Department (HOSPITAL_COMMUNITY): Payer: PPO

## 2022-08-17 ENCOUNTER — Encounter (HOSPITAL_COMMUNITY): Payer: Self-pay

## 2022-08-17 DIAGNOSIS — W16222A Fall in (into) bucket of water causing other injury, initial encounter: Secondary | ICD-10-CM | POA: Insufficient documentation

## 2022-08-17 DIAGNOSIS — I2510 Atherosclerotic heart disease of native coronary artery without angina pectoris: Secondary | ICD-10-CM | POA: Diagnosis not present

## 2022-08-17 DIAGNOSIS — S7002XA Contusion of left hip, initial encounter: Secondary | ICD-10-CM | POA: Insufficient documentation

## 2022-08-17 DIAGNOSIS — Z79899 Other long term (current) drug therapy: Secondary | ICD-10-CM | POA: Insufficient documentation

## 2022-08-17 DIAGNOSIS — I10 Essential (primary) hypertension: Secondary | ICD-10-CM | POA: Insufficient documentation

## 2022-08-17 DIAGNOSIS — Z951 Presence of aortocoronary bypass graft: Secondary | ICD-10-CM | POA: Diagnosis not present

## 2022-08-17 DIAGNOSIS — W19XXXA Unspecified fall, initial encounter: Secondary | ICD-10-CM

## 2022-08-17 DIAGNOSIS — Z7982 Long term (current) use of aspirin: Secondary | ICD-10-CM | POA: Diagnosis not present

## 2022-08-17 DIAGNOSIS — S79912A Unspecified injury of left hip, initial encounter: Secondary | ICD-10-CM | POA: Diagnosis present

## 2022-08-17 NOTE — ED Triage Notes (Signed)
Patient brought in by EMS after falling off of a bucket. C/O left hip pain. Able to walk, but is painful.

## 2022-08-17 NOTE — ED Provider Notes (Signed)
[place] EMERGENCY DEPARTMENT AT [facility] Provider Note   CSN: [id number] Arrival date & time: 08/17/22  4098     History  Chief Complaint  Patient presents with   [name] is a 89 y.o. male.  Patient is a 89 year old male with a history of hypertension, hyperlipidemia, CAD status post CABG and AVR due to aortic stenosis, paroxysmal atrial fibrillation on aspirin daily who is presenting today after a fall with ongoing left hip pain.  This occurred yesterday around 2 PM.  Patient went to sit down on a bucket of bird seed.  When he sat down he misjudged where he sat and he sat too close to the edge causing the bucket to fall backwards and he fell into a clay flowerpot.  He was able to get up on his own and ambulate back to the house but since this time he has had severe pain in his left hip.  He always uses a cane but his family member who is present in the room reports he has been dragging his leg since the fall.  Patient reports while he is laying down the pain is tolerable but severe if he tries to put any weight on it.  He denies hitting his head or any other location of pain  The history is provided by the patient.  Fall       Home Medications Prior to Admission medications   Medication Sig Start Date End Date Taking? Authorizing Provider  amLODipine (NORVASC) 2.5 MG tablet TAKE 1 TABLET BY MOUTH EVERY DAY 04/26/22   [place], Peter M, MD  aspirin (ASPIRIN CHILDRENS) 81 MG chewable tablet Chew 1 tablet (81 mg total) by mouth daily. 01/19/11   Rosalio Macadamia, NP  atorvastatin (LIPITOR) 40 MG tablet TAKE 1 TABLET BY MOUTH EVERY DAY 04/28/22   [place], Peter M, MD  carvedilol (COREG) 6.25 MG tablet TAKE 1 TABLET BY MOUTH 2 TIMES DAILY WITH A MEAL. 04/26/22   Loyola Mast, MD  clobetasol cream (TEMOVATE) 0.05 % APPLY 1 APPLICATION. TOPICALLY 2 (TWO) TIMES DAILY. 01/24/22   Loyola Mast, MD  clotrimazole (LOTRIMIN) 1 % cream APPLY TO AFFECTED AREA  TWICE A DAY 05/24/22   Loyola Mast, MD  COMBIGAN 0.2-0.5 % ophthalmic solution Place 1 drop into the left eye every 12 (twelve) hours.  12/06/12   [provider]  furosemide (LASIX) 20 MG tablet Take 2 tablets (40 mg total) by mouth daily. 05/17/21 06/21/22  Loyola Mast, MD  furosemide (LASIX) 40 MG tablet Take 1 tablet (40 mg total) by mouth daily. 05/26/22   [place], Peter M, MD  hydrALAZINE (APRESOLINE) 50 MG tablet Take 1 tablet (50 mg total) by mouth 3 (three) times daily. 05/26/22   [place], Peter M, MD  latanoprost (XALATAN) 0.005 % ophthalmic solution SMARTSIG:In Eye(s) 06/14/22   [provider]  nitroGLYCERIN (NITROSTAT) 0.4 MG SL tablet Place 1 tablet (0.4 mg total) under the tongue every 5 (five) minutes as needed. 02/08/22   Loyola Mast, MD  TRAVATAN Z 0.004 % SOLN ophthalmic solution Place 1 drop into the left eye at bedtime.  12/06/12   [provider]      Allergies    Codeine and Penicillins    Review of Systems   Review of Systems  Physical Exam Updated Vital Signs BP (!) 146/73 (BP Location: Left Arm)   Pulse 64   Temp (!) 97.3 F (36.3 C) (Oral)  Resp 16   Ht  (1.676 m)   Wt 81.6 kg   SpO2 99%   BMI 29.05 kg/m  Physical Exam Vitals and nursing note reviewed.  Constitutional:      General: He is not in acute distress.    Appearance: He is well-developed.  HENT:     Head: Normocephalic and atraumatic.  Eyes:     Conjunctiva/sclera: Conjunctivae normal.     Pupils: Pupils are equal, round, and reactive to light.  Cardiovascular:     Rate and Rhythm: Normal rate.     Pulses: Normal pulses.  Pulmonary:     Effort: Pulmonary effort is normal. No respiratory distress.  Abdominal:     General: There is no distension.     Palpations: Abdomen is soft.     Tenderness: There is no abdominal tenderness. There is no guarding or rebound.  Musculoskeletal:        General: Tenderness present.     Cervical back: Normal range of  motion and neck supple.     Left hip: Tenderness and bony tenderness present. Decreased range of motion.     Comments: Pain with axial loading the left leg, pain with internal and external rotation of the hip but able to flex the hip to 90 degrees without significant discomfort.  No deformity noted  Skin:    General: Skin is warm and dry.     Findings: No erythema or rash.  Neurological:     Mental Status: He is alert and oriented to person, place, and time.  Psychiatric:        Behavior: Behavior normal.     ED Results / Procedures / Treatments   Labs (all labs ordered are listed, but only abnormal results are displayed) Labs Reviewed - No data to display  EKG None  Radiology CT Hip Left Wo Contrast  Result Date: 08/17/2022 CLINICAL DATA:  Left hip pain with difficulty walking since falling yesterday. EXAM: CT OF THE LEFT HIP WITHOUT CONTRAST TECHNIQUE: Multidetector CT imaging of the left hip was performed according to the standard protocol. Multiplanar CT image reconstructions were also generated. RADIATION DOSE REDUCTION: This exam was performed according to the departmental dose-optimization program which includes automated exposure control, adjustment of the mA and/or kV according to patient size and/or use of iterative reconstruction technique. COMPARISON:  Radiographs 08/17/2022.  Pelvic CT 11/01/2004. FINDINGS: Bones/Joint/Cartilage No evidence of acute fracture, dislocation or femoral head osteonecrosis. Mild left hip degenerative changes for age without significant joint effusion. No significant abnormality of the visualized left sacroiliac joint. Ligaments Suboptimally assessed by CT. Muscles and Tendons Unremarkable. No focal atrophy, intramuscular hematoma or other fluid collection identified. Soft tissues No periarticular fluid collections, unexpected foreign bodies or soft tissue emphysema. The prostate gland is moderately enlarged. Penile prosthesis with apparent chronic  disassociation of the left-sided component, unchanged from remote pelvic CT. Diffuse vascular calcifications. IMPRESSION: 1. No acute osseous findings identified in the left hip. 2. Mild left hip degenerative changes for age. 3. Chronic disassociation of the left-sided component of the penile prosthesis, unchanged from remote pelvic CT. Electronically Signed   By: Carey Bullocks M.D.   On: 08/17/2022 12:42   DG Hip Unilat W or Wo Pelvis 2-3 Views Left  Result Date: 08/17/2022 CLINICAL DATA:  Pain EXAM: DG HIP (WITH OR WITHOUT PELVIS) 3V LEFT COMPARISON:  None Available. FINDINGS: Bilateral hip degenerative changes identified with joint space narrowing and small osteophytes. Lumbosacral degenerative changes. Pelvic ring is intact. No  acute fracture, dislocation or subluxation. IMPRESSION: Degenerative changes.  No acute osseous abnormalities identified. Electronically Signed   By: Layla Maw M.D.   On: 08/17/2022 10:09    Procedures Procedures    Medications Ordered in ED Medications - No data to display  ED Course/ Medical Decision Making/ A&P                             Medical Decision Making Amount and/or Complexity of Data Reviewed Radiology: ordered and independent interpretation performed. Decision-making details documented in ED Course.   Pt with multiple medical problems and comorbidities and presenting today with a complaint that caries a high risk for morbidity and mortality.  Presenting today with left hip pain after a fall yesterday.  He misjudged when he was sitting down and fell.  He has had pain in his left hip since.  Concern for pelvic fracture, greater tuberosity fracture.  Concern for some type of fracture lower suspicion for just contusion.  He has been taking Tylenol without significant pain control but denies wanting anything further for pain.  Plain images pending.  Patient did not have any other injury during this fall and does not take significant anticoagulation  other than aspirin.  1:31 PM I have independently visualized and interpreted pt's images today.  Left hip film is normal but due to intensity of pain will get CT to eval for occult fx.  1:31 PM CT is neg for occult fx. findings discussed with the patient.  Patient does have a walker at home to if he needs it.  At this time no indication for further testing.  Feel that patient is stable for discharge home.  He will continue Tylenol.          Final Clinical Impression(s) / ED Diagnoses Final diagnoses:  Fall, initial encounter  Contusion of left hip, initial encounter    Rx / DC Orders ED Discharge Orders     None         Gwyneth Sprout, MD 08/17/22 1331

## 2022-08-17 NOTE — Discharge Instructions (Addendum)
Your x-rays look normal today and nothing is broken.  It will be sore for the next few days and make sure you are using your walker or cane to get around.  Take Tylenol for the pain but you can also get some over-the-counter lidocaine patches or Voltaren gel which also may help with the pain.  Make sure you are taking it easy.  If it does not get better you should follow-up with the specialist

## 2022-09-07 ENCOUNTER — Encounter: Payer: Self-pay | Admitting: Family Medicine

## 2022-09-08 NOTE — Telephone Encounter (Signed)
Appointment scheduled for both on 09/29/22 at 8:00 and then 8:40 am.  Dm/cma

## 2022-09-21 ENCOUNTER — Ambulatory Visit: Payer: PPO | Admitting: Family Medicine

## 2022-09-29 ENCOUNTER — Ambulatory Visit (INDEPENDENT_AMBULATORY_CARE_PROVIDER_SITE_OTHER): Payer: PPO | Admitting: Family Medicine

## 2022-09-29 ENCOUNTER — Encounter: Payer: Self-pay | Admitting: Family Medicine

## 2022-09-29 VITALS — BP 126/68 | HR 64 | Temp 98.4°F | Ht 66.0 in | Wt 180.8 lb

## 2022-09-29 DIAGNOSIS — E782 Mixed hyperlipidemia: Secondary | ICD-10-CM | POA: Diagnosis not present

## 2022-09-29 DIAGNOSIS — N1832 Chronic kidney disease, stage 3b: Secondary | ICD-10-CM | POA: Diagnosis not present

## 2022-09-29 DIAGNOSIS — I5032 Chronic diastolic (congestive) heart failure: Secondary | ICD-10-CM

## 2022-09-29 DIAGNOSIS — I10 Essential (primary) hypertension: Secondary | ICD-10-CM | POA: Diagnosis not present

## 2022-09-29 DIAGNOSIS — C439 Malignant melanoma of skin, unspecified: Secondary | ICD-10-CM | POA: Insufficient documentation

## 2022-09-29 DIAGNOSIS — I4891 Unspecified atrial fibrillation: Secondary | ICD-10-CM | POA: Diagnosis not present

## 2022-09-29 LAB — BASIC METABOLIC PANEL
BUN: 24 mg/dL — ABNORMAL HIGH (ref 6–23)
CO2: 23 mEq/L (ref 19–32)
Calcium: 8.8 mg/dL (ref 8.4–10.5)
Chloride: 111 mEq/L (ref 96–112)
Creatinine, Ser: 1.32 mg/dL (ref 0.40–1.50)
GFR: 45.58 mL/min — ABNORMAL LOW (ref >60.00)
Glucose, Bld: 111 mg/dL — ABNORMAL HIGH (ref 70–99)
Potassium: 4.2 mEq/L (ref 3.5–5.1)
Sodium: 141 mEq/L (ref 135–145)

## 2022-09-29 LAB — LIPID PANEL
Cholesterol: 138 mg/dL (ref 0–200)
HDL: 40 mg/dL (ref >39.00)
LDL Cholesterol: 60 mg/dL (ref 0–99)
NonHDL: 98.21
Total CHOL/HDL Ratio: 3
Triglycerides: 189 mg/dL — ABNORMAL HIGH (ref 0.0–149.0)
VLDL: 37.8 mg/dL (ref 0.0–40.0)

## 2022-09-29 NOTE — Progress Notes (Signed)
[facility] [address] [place] [address] Dept: [phone] Dept Fax: [phone]  Chronic Care Office Visit  Subjective:    Patient ID: [name], male    DOB: Sep 05, 1924, 89 y.o..   MRN: [id number]  Chief Complaint  Patient presents with   Medical Management of Chronic Issues    3 month f/u.  Fasting today.    History of Present Illness:  Patient is in today for reassessment of chronic medical issues.  [name] has a history of CAD and chronic diastolic heart failure. His heart failure is managed on carvedilol 6.25 mg bid and furosemide 20 mg daily. He is not noting any significant increase in edema. He has some chronic dyspnea, esp. with exertion, but this has not been worse recently. He is still getting out to mow [place].   [name] has a history of hypertension. He is currently on amlodipine  2.5 mg daily and hydralazine 50 mg TID.   [name] has a history of hyperlipidemia. He is managed on atorvastatin 40 mg daily.   [name] had a recent skin lesion excised form his left upper arm. His son notes that the pathology did show melanoma. They are considering a wider excision, though it is unclear of the benefit for someone 89 years old.  Past Medical History: Patient Active Problem List   Diagnosis Date Noted   Chronic left-sided low back pain with left-sided sciatica 06/21/2022   Chronic diastolic heart failure (HCC) 09/08/2021   Acute on chronic diastolic CHF (congestive heart failure) (HCC) 07/29/2021   Elevated troponin 07/29/2021   Normocytic anemia 07/29/2021   Weakness 07/29/2021   Demand ischemia    Pedal edema 04/16/2021   Stage 3b chronic kidney disease (CKD) (HCC) 04/09/2021   Glaucoma 11/30/2020   Carotid artery disease (HCC) 11/12/2013   Claudication (HCC) 12/12/2012   Peripheral vascular disease (HCC) 12/12/2012   Carotid bruit 08/23/2011   Atrial fibrillation  with RVR (HCC) 01/12/2011   Essential hypertension    Hyperlipidemia    Coronary artery disease    Status post aortic valve replacement with tissue    Past Surgical History:  Procedure Laterality Date   APPENDECTOMY     CARDIAC CATHETERIZATION  04/02/2007   EF 60%/severe two-vessel obstructive coronary artery disease/patent saphenous bein graft to rt coronary/ severe stenosis at the ostium of the lt internal mammary artery graft to the abtuse marginal branch/normal lt ventricular function/ severe aortic stenosis/normal rt heart pressures   CARDIAC CATHETERIZATION  07/25/2002   EF 65%/two-vessel obstructive atherosclerotic coronary artery disease/ patent saphenous vein graft to the distal rt coronary arter//patent lt internal mammary artery graft to the obtuse marginal branch/normal lt ventricular function   CARDIAC CATHETERIZATION  05/17/1991   EF65%/two-vessel ovstructive atherosclerotic coronary artery disease/good lt ventricular performance/compared with previous catheterization there is now total occlusion of the tr coronary artery at the prior angioplasty site/good collateral flow to the distal rt coronary artery is seen/there is also progressive disease in the lt circumflex vessel now to obstructive levels   CARDIAC CATHETERIZATION  07/26/1990   EF60%/borderline obstructive coronary artery disease in the mid lt circumflex & in the rt coronary artery prior angioplasty site/normal lt ventricular function   CARDIAC CATHETERIZATION  05/23/1990   successful percutaneous transluminal coronary angioplasty of the proximal rt coronary artery   CATARACT EXTRACTION W/ INTRAOCULAR LENS IMPLANT Bilateral    CORONARY ANGIOPLASTY  07/12/1990   single vessel obstructive  atherosclerotic coronary atrery disease in the rt corornary artery with restenosis of the primary angioplasty site/successful repeat PTCA of the proximal rt coronary arter   CORONARY ARTERY BYPASS GRAFT     CORONARY ARTERY BYPASS GRAFT      x2 using a reverse saphenous vein graft to posterior descending, reseverse saphenous vein graft to lt internal mammary artery at the previous bypass to the circumflex with a no vein harvesting   GANGLION CYST EXCISION Right    Wrist   HEMORRHOID SURGERY     at age 72   INGUINAL HERNIA REPAIR  05/02/1965   PENILE PROSTHESIS IMPLANT  05/03/1987   STERNOTOMY     redo median with aortic valve replacement with a pericardial tissue valve/[employer] model 3000,109mm,serial numver [id number]   TONSILLECTOMY     at age 51   VASECTOMY     at age 83   Family History  Problem Relation Age of Onset   Cancer Father 50       intestinal   Heart attack Mother 23   Stroke Sister    Outpatient Medications Prior to Visit  Medication Sig Dispense Refill   amLODipine (NORVASC) 2.5 MG tablet TAKE 1 TABLET BY MOUTH EVERY DAY 90 tablet 3   aspirin (ASPIRIN CHILDRENS) 81 MG chewable tablet Chew 1 tablet (81 mg total) by mouth daily. 36 tablet 11   atorvastatin (LIPITOR) 40 MG tablet TAKE 1 TABLET BY MOUTH EVERY DAY 90 tablet 2   carvedilol (COREG) 6.25 MG tablet TAKE 1 TABLET BY MOUTH 2 TIMES DAILY WITH A MEAL. 180 tablet 3   clobetasol cream (TEMOVATE) 0.05 % APPLY 1 APPLICATION. TOPICALLY 2 (TWO) TIMES DAILY. 30 g 0   clotrimazole (LOTRIMIN) 1 % cream APPLY TO AFFECTED AREA TWICE A DAY 30 g 0   COMBIGAN 0.2-0.5 % ophthalmic solution Place 1 drop into the left eye every 12 (twelve) hours.      furosemide (LASIX) 40 MG tablet Take 1 tablet (40 mg total) by mouth daily. 90 tablet 1   hydrALAZINE (APRESOLINE) 50 MG tablet Take 1 tablet (50 mg total) by mouth 3 (three) times daily. 270 tablet 1   latanoprost (XALATAN) 0.005 % ophthalmic solution SMARTSIG:In Eye(s)     nitroGLYCERIN (NITROSTAT) 0.4 MG SL tablet Place 1 tablet (0.4 mg total) under the tongue every 5 (five) minutes as needed. 25 tablet 1   TRAVATAN Z 0.004 % SOLN ophthalmic solution Place 1 drop into the left eye at bedtime.       furosemide (LASIX) 20 MG tablet Take 2 tablets (40 mg total) by mouth daily. 180 tablet 3   No facility-administered medications prior to visit.   Allergies  Allergen Reactions   Codeine     Hallucinations   Penicillins Hives    Did it involve swelling of the face/tongue/throat, SOB, or low BP? No Did it involve sudden or severe rash/hives, skin peeling, or any reaction on the inside of your mouth or nose? Yes Did you need to seek medical attention at a hospital or doctor's office? No When did it last happen?     Decades ago  If all above answers are "NO", may proceed with cephalosporin use.     Objective:   Today's Vitals   09/29/22 0756  BP: 126/68  Pulse: 64  Temp: 98.4 F (36.9 C)  TempSrc: Temporal  SpO2: 96%  Weight: 180 lb 12.8 oz (82 kg)  Height: 5\' 6"  (1.676 m)   Body mass index is  29.18 kg/m.   General: Well developed, well nourished. No acute distress. Lungs: Clear to auscultation bilaterally. No wheezing, rales or rhonchi. CV: RRR without murmurs or rubs. Pulses 2+ bilaterally. Skin: Warm and dry. Healing wound on dorsal aspect of the left upper arm. There is also a healign site   on the right lower back where patient reports cryotherapy. Ext: No edema. Psych: Alert and oriented. Normal mood and affect.  Health Maintenance Due  Topic Date Due   Medicare Annual Wellness (AWV)  Never done   Zoster Vaccines- Shingrix (1 of 2) Never done     Assessment & Plan:   Problem List Items Addressed This Visit       Cardiovascular and Mediastinum   Essential hypertension    Blood pressure is in good control. Continue amlodipine  2.5 mg daily and hydralazine 50 mg TID. I will check annual renal labs today.       Relevant Orders   Basic metabolic panel   Atrial fibrillation with RVR (HCC)    Stable. Rate well-controlled. Continue daily aspirin and carvedilol 6.25 mg bid.      Chronic diastolic heart failure (HCC) - Primary    Weight is down 2 lbs from Aug.  Compensated. Continue carvedilol 6.25 mg bid and furosemide 20 mg daily.        Musculoskeletal and Integument   Melanoma of skin (HCC)- Left upper arm    Recently excised. Agree that risk/benefit of wider excision should be considered.        Genitourinary   Stage 3b chronic kidney disease (CKD) (HCC)    Blood pressure is at goal. We will check renal function today.      Relevant Orders   Basic metabolic panel     Other   Hyperlipidemia    Continue atorvastatin 40 mg daily. We will check fasting lipids today.      Relevant Orders   Lipid panel    Return in about 6 months (around 04/01/2023) for Reassessment.   Loyola Mast, MD

## 2022-09-29 NOTE — Assessment & Plan Note (Signed)
Blood pressure is at goal. We will check renal function today.

## 2022-09-29 NOTE — Assessment & Plan Note (Signed)
Recently excised. Agree that risk/benefit of wider excision should be considered.

## 2022-09-29 NOTE — Assessment & Plan Note (Signed)
Weight is down 2 lbs from Aug. Compensated. Continue carvedilol 6.25 mg bid and furosemide 20 mg daily.

## 2022-09-29 NOTE — Assessment & Plan Note (Signed)
Stable. Rate well-controlled. Continue daily aspirin and carvedilol 6.25 mg bid.

## 2022-09-29 NOTE — Assessment & Plan Note (Signed)
Continue atorvastatin 40 mg daily. We will check fasting lipids today.

## 2022-09-29 NOTE — Assessment & Plan Note (Signed)
Blood pressure is in good control. Continue amlodipine  2.5 mg daily and hydralazine 50 mg TID. I will check annual renal labs today.

## 2022-10-12 ENCOUNTER — Ambulatory Visit: Payer: PPO | Admitting: Podiatry

## 2022-10-12 ENCOUNTER — Encounter: Payer: Self-pay | Admitting: Podiatry

## 2022-10-12 DIAGNOSIS — M79675 Pain in left toe(s): Secondary | ICD-10-CM | POA: Diagnosis not present

## 2022-10-12 DIAGNOSIS — M79674 Pain in right toe(s): Secondary | ICD-10-CM | POA: Diagnosis not present

## 2022-10-12 DIAGNOSIS — B351 Tinea unguium: Secondary | ICD-10-CM

## 2022-10-12 NOTE — Progress Notes (Signed)
  Subjective:  Patient ID: [name], male    DOB: Sep 11, 1924,  MRN: [id number]  Chief Complaint  Patient presents with   Nail Problem    Nail trim    89 y.o. male returns for the above complaint.  Patient presents with thickened elongated dystrophic mycotic toenails symptomatic interpose worse with ambulation is with pressure she would like for me to debride down she is not able to do it himself.  Denies any other acute complaints  Objective:  There were no vitals filed for this visit. Podiatric Exam: Vascular: dorsalis pedis and posterior tibial pulses are palpable bilateral. Capillary return is immediate. Temperature gradient is WNL. Skin turgor WNL  Sensorium: Normal Semmes Weinstein monofilament test. Normal tactile sensation bilaterally. Nail Exam: Pt has thick disfigured discolored nails with subungual debris noted bilateral entire nail hallux through fifth toenails.  Pain on palpation to the nails. Ulcer Exam: There is no evidence of ulcer or pre-ulcerative changes or infection. Orthopedic Exam: Muscle tone and strength are WNL. No limitations in general ROM. No crepitus or effusions noted.  Skin: No Porokeratosis. No infection or ulcers    Assessment & Plan:   1. Pain due to onychomycosis of toenails of both feet     Patient was evaluated and treated and all questions answered.  Onychomycosis with pain  -Nails palliatively debrided as below. -Educated on self-care  Procedure: Nail Debridement Rationale: pain  Type of Debridement: manual, sharp debridement. Instrumentation: Nail nipper, rotary burr. Number of Nails: 10  Procedures and Treatment: Consent by patient was obtained for treatment procedures. The patient understood the discussion of treatment and procedures well. All questions were answered thoroughly reviewed. Debridement of mycotic and hypertrophic toenails, 1 through 5 bilateral and clearing of subungual debris. No ulceration, no infection noted.   Return Visit-Office Procedure: Patient instructed to return to the office for a follow up visit 3 months for continued evaluation and treatment.  Nicholes Rough, DPM    Return in about 3 months (around 01/12/2023).

## 2022-10-20 ENCOUNTER — Other Ambulatory Visit: Payer: Self-pay | Admitting: Cardiology

## 2022-10-27 NOTE — Progress Notes (Signed)
[name] Date of Birth: [birth year] Medical Record #[id number]  History of Present Illness: [name] is seen for follow up of CAD. He is s/p redo CABG and AVR in 2008.  He did have carotid dopplers in August 2017  which showed 40-59% bilateral ICA stenosis. Repeat in 2019 showed < 39% stenosis bilaterally.    Myoview study in May 2013 was normal. Echo in 2012 showed normal LV function and mild aortic stenosis. He also had LE arterial dopplers in August 2016 which showed mild vascular disease with good ABIs.   He was admitted in December 2022 with HA, severe HTN and mental status changes. Neuro imaging was negative. Echocardiogram shows preserved LVEF, grade 1 diastolic dysfunction, severe LAE, mod RAE, well-seated AVR without leak. Increased hydralazine 50mg  > 100mg  TID beginning 12/9. With addition of other medications he became orthostatic, so this was returned to PTA dosing with improvement.  Changed metoprolol to coreg.  Added norvasc 2.5mg  (hx LE swelling due to this at higher dose).  He was seen in the ED on 06/11/21 after getting up from a nap he fell. Had laceration of scalp. CT negative.   He was admitted to the hospital on 07/29/2021. He was discharged on 08/02/2021. He was noted to have generalized weakness and dyspnea. He was diagnosed with decompensated CHF. He received IV diuresis. His elevated troponins were felt to be related to demand ischemia. His discharge weight was 182 pounds. His echocardiogram showed preserved EF.   He is now 89 years old.  No syncope. Does given out of breath easily. No chest pain. Tries to watch his salt intake. Does use a riding mower and crochets for church.    Current Outpatient Medications on File Prior to Visit  Medication Sig Dispense Refill   amLODipine (NORVASC) 2.5 MG tablet TAKE 1 TABLET BY MOUTH EVERY DAY 90 tablet 3   aspirin (ASPIRIN CHILDRENS) 81 MG chewable tablet Chew 1 tablet (81 mg total) by mouth daily. 36 tablet 11   carvedilol  (COREG) 6.25 MG tablet TAKE 1 TABLET BY MOUTH 2 TIMES DAILY WITH A MEAL. 180 tablet 3   COMBIGAN 0.2-0.5 % ophthalmic solution Place 1 drop into the left eye every 12 (twelve) hours.      hydrALAZINE (APRESOLINE) 50 MG tablet Take 1 tablet (50 mg total) by mouth 3 (three) times daily. 270 tablet 1   latanoprost (XALATAN) 0.005 % ophthalmic solution SMARTSIG:In Eye(s)     TRAVATAN Z 0.004 % SOLN ophthalmic solution Place 1 drop into the left eye at bedtime.      nitroGLYCERIN (NITROSTAT) 0.4 MG SL tablet Place 1 tablet (0.4 mg total) under the tongue every 5 (five) minutes as needed. (Patient not taking: Reported on 10/31/2022) 25 tablet 1   No current facility-administered medications on file prior to visit.    Allergies  Allergen Reactions   Codeine     Hallucinations   Penicillins Hives    Did it involve swelling of the face/tongue/throat, SOB, or low BP? No Did it involve sudden or severe rash/hives, skin peeling, or any reaction on the inside of your mouth or nose? Yes Did you need to seek medical attention at a hospital or doctor's office? No When did it last happen?     Decades ago  If all above answers are "NO", may proceed with cephalosporin use.      Past Medical History:  Diagnosis Date   Carotid arterial disease (HCC)    Doppler in 2003 with 40  to 60% L ICA   Carpal tunnel syndrome    Coronary artery disease    s/p CABG & redo CABG with AVR   Hyperlipidemia    Hypertension    Meningitis    Normal echocardiogram Sept 2012   Has normal EF, mild AS and moderate LAE   PAF (paroxysmal atrial fibrillation) [facility]) Sept 2012   converted spontaneously   PUD (peptic ulcer disease)    Remote history   Status post aortic valve replacement with tissue    for aortic stenosis    Past Surgical History:  Procedure Laterality Date   APPENDECTOMY     CARDIAC CATHETERIZATION  04/02/2007   EF 60%/severe two-vessel obstructive coronary artery disease/patent saphenous bein graft to rt  coronary/ severe stenosis at the ostium of the lt internal mammary artery graft to the abtuse marginal branch/normal lt ventricular function/ severe aortic stenosis/normal rt heart pressures   CARDIAC CATHETERIZATION  07/25/2002   EF 65%/two-vessel obstructive atherosclerotic coronary artery disease/ patent saphenous vein graft to the distal rt coronary arter//patent lt internal mammary artery graft to the obtuse marginal branch/normal lt ventricular function   CARDIAC CATHETERIZATION  05/17/1991   EF65%/two-vessel ovstructive atherosclerotic coronary artery disease/good lt ventricular performance/compared with previous catheterization there is now total occlusion of the tr coronary artery at the prior angioplasty site/good collateral flow to the distal rt coronary artery is seen/there is also progressive disease in the lt circumflex vessel now to obstructive levels   CARDIAC CATHETERIZATION  07/26/1990   EF60%/borderline obstructive coronary artery disease in the mid lt circumflex & in the rt coronary artery prior angioplasty site/normal lt ventricular function   CARDIAC CATHETERIZATION  05/23/1990   successful percutaneous transluminal coronary angioplasty of the proximal rt coronary artery   CATARACT EXTRACTION W/ INTRAOCULAR LENS IMPLANT Bilateral    CORONARY ANGIOPLASTY  07/12/1990   single vessel obstructive atherosclerotic coronary atrery disease in the rt corornary artery with restenosis of the primary angioplasty site/successful repeat PTCA of the proximal rt coronary arter   CORONARY ARTERY BYPASS GRAFT     CORONARY ARTERY BYPASS GRAFT     x2 using a reverse saphenous vein graft to posterior descending, reseverse saphenous vein graft to lt internal mammary artery at the previous bypass to the circumflex with a no vein harvesting   GANGLION CYST EXCISION Right    Wrist   HEMORRHOID SURGERY     at age 67   INGUINAL HERNIA REPAIR  05/02/1965   PENILE PROSTHESIS IMPLANT  05/03/1987    STERNOTOMY     redo median with aortic valve replacement with a pericardial tissue valve/[employer] model 3000,52mm,serial numver [id number]   TONSILLECTOMY     at age 28   VASECTOMY     at age 89    Social History   Tobacco Use  Smoking Status Former   Packs/day: 1.50   Years: 41.00   Additional pack years: 0.00   Total pack years: 61.50   Types: Cigarettes, Cigars   Quit date: 05/02/1982   Years since quitting: 40.5  Smokeless Tobacco Never    Social History   Substance and Sexual Activity  Alcohol Use No    Family History  Problem Relation Age of Onset   Cancer Father 35       intestinal   Heart attack Mother 52   Stroke Sister     Review of Systems: The review of systems is per the HPI.  All other systems were reviewed and are negative.  Physical Exam: BP 124/84   Pulse 63   Ht 5\' 6"  (1.676 m)   Wt 179 lb 6.4 oz (81.4 kg)   SpO2 94%   BMI 28.96 kg/m  GENERAL:  Well appearing elderly WM in NAD HEENT:  PERRL, EOMI, sclera are clear. Oropharynx is clear. Laceration on back of scalp with staples in place.  NECK:  No jugular venous distention,soft bilateral bruits, no thyromegaly or adenopathy LUNGS:  Clear to auscultation bilaterally CHEST:  Unremarkable HEART:  RRR,  PMI not displaced or sustained,S1 and S2 within normal limits, no S3, no S4: no clicks, no rubs, soft systolic murmur RUSB ABD:  Soft, nontender. BS +, no masses or bruits. No hepatomegaly, no splenomegaly EXT:  2 + pulses throughout,  2+ pretibial edema, no cyanosis no clubbing SKIN:  Warm and dry.  No rashes NEURO:  Alert and oriented x 3. Cranial nerves II through XII intact. PSYCH:  Cognitively intact  LABORATORY DATA: Lab Results  Component Value Date   WBC 6.2 09/08/2021   HGB 10.9 (L) 09/08/2021   HCT 33.3 (L) 09/08/2021   PLT 161.0 09/08/2021   GLUCOSE 111 (H) 09/29/2022   CHOL 138 09/29/2022   TRIG 189.0 (H) 09/29/2022   HDL 40.00 09/29/2022   LDLCALC 60 09/29/2022    ALT 12 07/29/2021   AST 20 07/29/2021   NA 141 09/29/2022   K 4.2 09/29/2022   CL 111 09/29/2022   CREATININE 1.32 09/29/2022   BUN 24 (H) 09/29/2022   CO2 23 09/29/2022   TSH 3.515 07/29/2021   INR 0.9 04/08/2021   HGBA1C 6.1 (H) 07/02/2019    EKG Interpretation Date/Time:  Monday October 31 2022 09:20:18 EDT Ventricular Rate:  63 PR Interval:  216 QRS Duration:  150 QT Interval:  460 QTC Calculation: 470 R Axis:   2  Text Interpretation: Sinus rhythm with sinus arrhythmia with 1st degree A-V block Left bundle branch block When compared with ECG of 29-Jul-2021 13:00, No significant change since last tracing Confirmed by [place], Sheetal Lyall [phone]) on 10/31/2022 9:35:36 AM   Echo 07/30/21: IMPRESSIONS     1. Left ventricular ejection fraction, by estimation, is 50 to 55%. The  left ventricle has low normal function. The left ventricle has no regional  wall motion abnormalities. There is moderate concentric left ventricular  hypertrophy. Left ventricular  diastolic parameters are indeterminate.   2. Right ventricular systolic function is moderately reduced. The right  ventricular size is moderately enlarged. There is normal pulmonary artery  systolic pressure.   3. Left atrial size was severely dilated.   4. Right atrial size was mildly dilated.   5. The mitral valve is grossly normal. Mild mitral valve regurgitation.  No evidence of mitral stenosis. Severe mitral annular calcification.   6. The aortic valve has been repaired/replaced. Aortic valve  regurgitation is not visualized. There is a 23 mm Edwards bovine valve  present in the aortic position. Procedure Date: 2009. Echo findings are  consistent with normal structure and function of  the aortic valve prosthesis. Aortic valve mean gradient measures 9.0 mmHg.   7. The inferior vena cava is dilated in size with >50% respiratory  variability, suggesting right atrial pressure of 8 mmHg.   Comparison(s): No significant change from  prior study.   Conclusion(s)/Recommendation(s): Otherwise normal echocardiogram, with  minor abnormalities described in the report.   Assessment / Plan: 1. Coronary disease status post redo CABG with aortic valve replacement with a pericardial tissue valve in 2008.  Myoview study in May of 2013 was normal. He has stable class 1-2 angina. Continue current therapy  2. Hyperlipidemia. On lipitor 40 mg daily.   Encourage heart healthy eating. LDL 60  3. Hypertension. Blood pressure control has improved. Continue salt restriction. Would like to avoid orthostasis/low BP  4. Status post tissue aortic valve replacement. Stable by exam.   5.  Carotid arterial disease. Mild and asymptomatic.  6. Edema. Continue current lasix dose  Will follow up in one year unless needed.

## 2022-10-31 ENCOUNTER — Ambulatory Visit: Payer: PPO | Attending: Cardiology | Admitting: Cardiology

## 2022-10-31 ENCOUNTER — Encounter: Payer: Self-pay | Admitting: Cardiology

## 2022-10-31 VITALS — BP 124/84 | HR 63 | Ht 66.0 in | Wt 179.4 lb

## 2022-10-31 DIAGNOSIS — I10 Essential (primary) hypertension: Secondary | ICD-10-CM | POA: Diagnosis not present

## 2022-10-31 DIAGNOSIS — Z953 Presence of xenogenic heart valve: Secondary | ICD-10-CM

## 2022-10-31 DIAGNOSIS — N183 Chronic kidney disease, stage 3 unspecified: Secondary | ICD-10-CM

## 2022-10-31 DIAGNOSIS — E7800 Pure hypercholesterolemia, unspecified: Secondary | ICD-10-CM

## 2022-10-31 DIAGNOSIS — I25119 Atherosclerotic heart disease of native coronary artery with unspecified angina pectoris: Secondary | ICD-10-CM | POA: Diagnosis not present

## 2022-10-31 MED ORDER — FUROSEMIDE 40 MG PO TABS: 40.0000 mg | ORAL_TABLET | Freq: Every day | ORAL | 3 refills | Status: DC

## 2022-10-31 MED ORDER — ATORVASTATIN CALCIUM 40 MG PO TABS: 40.0000 mg | ORAL_TABLET | Freq: Every day | ORAL | 3 refills | Status: DC

## 2022-10-31 NOTE — Patient Instructions (Signed)
Medication Instructions:  Continue same medications *If you need a refill on your cardiac medications before your next appointment, please call your pharmacy*   Lab Work: None ordered   Testing/Procedures: None ordered   Follow-Up: At [facility], you and your health needs are our priority.  As part of our continuing mission to provide you with exceptional heart care, we have created designated Provider Care Teams.  These Care Teams include your primary Cardiologist (physician) and Advanced Practice Providers (APPs -  Physician Assistants and Nurse Practitioners) who all work together to provide you with the care you need, when you need it.  We recommend signing up for the patient portal called "MyChart".  Sign up information is provided on this After Visit Summary.  MyChart is used to connect with patients for Virtual Visits (Telemedicine).  Patients are able to view lab/test results, encounter notes, upcoming appointments, etc.  Non-urgent messages can be sent to your provider as well.   To learn more about what you can do with MyChart, go to [email or web address].    Your next appointment:  1 year    Call in March to schedule July appointment     Provider:  Dr.Jordan

## 2022-11-01 ENCOUNTER — Encounter: Payer: Self-pay | Admitting: Cardiology

## 2022-11-16 ENCOUNTER — Other Ambulatory Visit: Payer: Self-pay | Admitting: Family Medicine

## 2022-12-12 ENCOUNTER — Encounter: Payer: Self-pay | Admitting: Family Medicine

## 2023-04-07 ENCOUNTER — Ambulatory Visit (INDEPENDENT_AMBULATORY_CARE_PROVIDER_SITE_OTHER): Payer: PPO | Admitting: Family Medicine

## 2023-04-07 ENCOUNTER — Encounter: Payer: Self-pay | Admitting: Family Medicine

## 2023-04-07 VITALS — BP 130/68 | HR 72 | Temp 98.1°F | Ht 66.0 in | Wt 180.8 lb

## 2023-04-07 DIAGNOSIS — I10 Essential (primary) hypertension: Secondary | ICD-10-CM | POA: Diagnosis not present

## 2023-04-07 DIAGNOSIS — I5032 Chronic diastolic (congestive) heart failure: Secondary | ICD-10-CM | POA: Diagnosis not present

## 2023-04-07 DIAGNOSIS — Z66 Do not resuscitate: Secondary | ICD-10-CM

## 2023-04-07 DIAGNOSIS — J310 Chronic rhinitis: Secondary | ICD-10-CM

## 2023-04-07 DIAGNOSIS — N1832 Chronic kidney disease, stage 3b: Secondary | ICD-10-CM | POA: Diagnosis not present

## 2023-04-07 DIAGNOSIS — E782 Mixed hyperlipidemia: Secondary | ICD-10-CM

## 2023-04-07 LAB — CBC
HCT: 33.2 % — ABNORMAL LOW (ref 39.0–52.0)
Hemoglobin: 11.7 g/dL — ABNORMAL LOW (ref 13.0–17.0)
MCHC: 35.4 g/dL (ref 30.0–36.0)
MCV: 95.7 fL (ref 78.0–100.0)
Platelets: 183 10*3/uL (ref 150.0–400.0)
RBC: 3.47 Mil/uL — ABNORMAL LOW (ref 4.22–5.81)
RDW: 14.6 % (ref 11.5–15.5)
WBC: 7.6 10*3/uL (ref 4.0–10.5)

## 2023-04-07 MED ORDER — CARVEDILOL 6.25 MG PO TABS: 6.2500 mg | ORAL_TABLET | Freq: Two times a day (BID) | ORAL | 3 refills | Status: DC

## 2023-04-07 MED ORDER — AZELASTINE HCL 0.1 % NA SOLN: 2.0000 | Freq: Every day | NASAL | 12 refills | Status: DC

## 2023-04-07 MED ORDER — HYDRALAZINE HCL 50 MG PO TABS: 50.0000 mg | ORAL_TABLET | Freq: Three times a day (TID) | ORAL | 1 refills | Status: DC

## 2023-04-07 NOTE — Progress Notes (Signed)
[facility] [address] [place] [address] Dept: [phone] Dept Fax: [phone]  Chronic Care Office Visit  Subjective:    Patient ID: [name], male    DOB: 16-Aug-1924, 89 y.o..   MRN: [id number]  Chief Complaint  Patient presents with   Follow-up    6 month f/u.  No concerns.  Wants DNR.    History of Present Illness:  Patient is in today for reassessment of chronic medical issues.  Mr. Sieck has a history of CAD and chronic diastolic heart failure. His heart failure is managed on carvedilol 6.25 mg bid and furosemide 20 mg daily. He is not noting any significant increase in edema. He has some chronic dyspnea, esp. with exertion, but this has not been worse recently. He is still getting out to mow 8 [place] his [place].   Mr. Callon has a history of hypertension. He is currently on amlodipine  2.5 mg daily and hydralazine 50 mg TID.   Mr. Stadler has a history of hyperlipidemia. He is managed on atorvastatin 40 mg daily.   Mr. Ebanks request to complete an order for DNR. He is clear in his desire to forego CPR were he to suffer a cardiac arrest.  Mr. Shadbolt complains of phlegm int he morning that he has to cough up. He admits that he has to blow his nose multiple times as well.  Past Medical History: Patient Active Problem List   Diagnosis Date Noted   Chronic rhinitis 04/07/2023   DNR (do not resuscitate) 04/07/2023   Melanoma of skin (HCC)- Left upper arm 09/29/2022   Chronic left-sided low back pain with left-sided sciatica 06/21/2022   Chronic diastolic heart failure (HCC) 09/08/2021   Acute on chronic diastolic CHF (congestive heart failure) (HCC) 07/29/2021   Elevated troponin 07/29/2021   Normocytic anemia 07/29/2021   Weakness 07/29/2021   Demand ischemia (HCC)    Pedal edema 04/16/2021   Stage 3b chronic kidney disease (CKD) (HCC) 04/09/2021   Glaucoma 11/30/2020   Carotid artery disease  (HCC) 11/12/2013   Claudication (HCC) 12/12/2012   Peripheral vascular disease (HCC) 12/12/2012   Carotid bruit 08/23/2011   Atrial fibrillation with RVR (HCC) 01/12/2011   Essential hypertension    Hyperlipidemia    Coronary artery disease    Status post aortic valve replacement with tissue    Past Surgical History:  Procedure Laterality Date   APPENDECTOMY     CARDIAC CATHETERIZATION  04/02/2007   EF 60%/severe two-vessel obstructive coronary artery disease/patent saphenous bein graft to rt coronary/ severe stenosis at the ostium of the lt internal mammary artery graft to the abtuse marginal branch/normal lt ventricular function/ severe aortic stenosis/normal rt heart pressures   CARDIAC CATHETERIZATION  07/25/2002   EF 65%/two-vessel obstructive atherosclerotic coronary artery disease/ patent saphenous vein graft to the distal rt coronary arter//patent lt internal mammary artery graft to the obtuse marginal branch/normal lt ventricular function   CARDIAC CATHETERIZATION  05/17/1991   EF65%/two-vessel ovstructive atherosclerotic coronary artery disease/good lt ventricular performance/compared with previous catheterization there is now total occlusion of the tr coronary artery at the prior angioplasty site/good collateral flow to the distal rt coronary artery is seen/there is also progressive disease in the lt circumflex vessel now to obstructive levels   CARDIAC CATHETERIZATION  07/26/1990   EF60%/borderline obstructive coronary artery disease in the mid lt circumflex & in the rt coronary artery prior angioplasty site/normal lt ventricular function   CARDIAC CATHETERIZATION  05/23/1990   successful percutaneous transluminal coronary angioplasty of the proximal rt coronary artery   CATARACT EXTRACTION W/ INTRAOCULAR LENS IMPLANT Bilateral    CORONARY ANGIOPLASTY  07/12/1990   single vessel obstructive atherosclerotic coronary atrery disease in the rt corornary artery with restenosis of the  primary angioplasty site/successful repeat PTCA of the proximal rt coronary arter   CORONARY ARTERY BYPASS GRAFT     CORONARY ARTERY BYPASS GRAFT     x2 using a reverse saphenous vein graft to posterior descending, reseverse saphenous vein graft to lt internal mammary artery at the previous bypass to the circumflex with a no vein harvesting   GANGLION CYST EXCISION Right    Wrist   HEMORRHOID SURGERY     at age 83   INGUINAL HERNIA REPAIR  05/02/1965   PENILE PROSTHESIS IMPLANT  05/03/1987   STERNOTOMY     redo median with aortic valve replacement with a pericardial tissue valve/[employer] model 3000,75mm,serial numver [id number]   TONSILLECTOMY     at age 48   VASECTOMY     at age 52   Family History  Problem Relation Age of Onset   Cancer Father 80       intestinal   Heart attack Mother 40   Stroke Sister    Outpatient Medications Prior to Visit  Medication Sig Dispense Refill   amLODipine (NORVASC) 2.5 MG tablet TAKE 1 TABLET BY MOUTH EVERY DAY 90 tablet 3   aspirin (ASPIRIN CHILDRENS) 81 MG chewable tablet Chew 1 tablet (81 mg total) by mouth daily. 36 tablet 11   atorvastatin (LIPITOR) 40 MG tablet Take 1 tablet (40 mg total) by mouth daily. 90 tablet 3   COMBIGAN 0.2-0.5 % ophthalmic solution Place 1 drop into the left eye every 12 (twelve) hours.      furosemide (LASIX) 40 MG tablet Take 1 tablet (40 mg total) by mouth daily. 90 tablet 3   latanoprost (XALATAN) 0.005 % ophthalmic solution SMARTSIG:In Eye(s)     nitroGLYCERIN (NITROSTAT) 0.4 MG SL tablet Place 1 tablet (0.4 mg total) under the tongue every 5 (five) minutes as needed. 25 tablet 1   TRAVATAN Z 0.004 % SOLN ophthalmic solution Place 1 drop into the left eye at bedtime.      carvedilol (COREG) 6.25 MG tablet TAKE 1 TABLET BY MOUTH 2 TIMES DAILY WITH A MEAL. 180 tablet 3   hydrALAZINE (APRESOLINE) 50 MG tablet TAKE 1 TABLET BY MOUTH THREE TIMES A DAY 270 tablet 1   No facility-administered medications  prior to visit.   Allergies  Allergen Reactions   Codeine     Hallucinations   Penicillins Hives    Did it involve swelling of the face/tongue/throat, SOB, or low BP? No Did it involve sudden or severe rash/hives, skin peeling, or any reaction on the inside of your mouth or nose? Yes Did you need to seek medical attention at a hospital or doctor's office? No When did it last happen?     Decades ago  If all above answers are "NO", may proceed with cephalosporin use.     Objective:   Today's Vitals   04/07/23 0803  BP: 130/68  Pulse: 72  Temp: 98.1 F (36.7 C)  TempSrc: Temporal  SpO2: 96%  Weight: 180 lb 12.8 oz (82 kg)  Height: 5\' 6"  (1.676 m)   Body mass index is 29.18 kg/m.   General: Well developed, well nourished. No acute distress. Lungs: Clear to auscultation bilaterally. No wheezing, rales  or rhonchi. CV: RRR without murmurs or rubs. Pulses 2+ bilaterally. Extremities: 1+ edema of lower legs. Psych: Alert and oriented. Normal mood and affect.  Health Maintenance Due  Topic Date Due   Medicare Annual Wellness (AWV)  Never done   Zoster Vaccines- Shingrix (1 of 2) Never done     Assessment & Plan:   Problem List Items Addressed This Visit       Cardiovascular and Mediastinum   Chronic diastolic heart failure (HCC) - Primary    Weight is stable. Compensated. Continue carvedilol 6.25 mg bid and furosemide 20 mg daily.      Relevant Medications   carvedilol (COREG) 6.25 MG tablet   hydrALAZINE (APRESOLINE) 50 MG tablet   Essential hypertension    Blood pressure is in good control. Continue amlodipine  2.5 mg daily and hydralazine 50 mg TID.       Relevant Medications   carvedilol (COREG) 6.25 MG tablet   hydrALAZINE (APRESOLINE) 50 MG tablet     Respiratory   Chronic rhinitis    Symptoms are consistent with chronic rhinitis. I will try him on azelastine spray at bed time to see if this will reduce his morning phlegm.      Relevant Medications    azelastine (ASTELIN) 0.1 % nasal spray     Genitourinary   Stage 3b chronic kidney disease (CKD) (HCC)    Blood pressure is at goal. I will check a CBC today.      Relevant Orders   CBC     Other   DNR (do not resuscitate)    With patient's consent, I completed a form to document a DNR order.      Hyperlipidemia    Lipids are at goal. Continue atorvastatin 40 mg daily.      Relevant Medications   carvedilol (COREG) 6.25 MG tablet   hydrALAZINE (APRESOLINE) 50 MG tablet    Return in about 6 months (around 10/06/2023) for Reassessment.   Loyola Mast, MD

## 2023-04-07 NOTE — Assessment & Plan Note (Signed)
Lipids are at goal. Continue atorvastatin 40 mg daily.

## 2023-04-07 NOTE — Assessment & Plan Note (Signed)
Blood pressure is in good control. Continue amlodipine  2.5 mg daily and hydralazine 50 mg TID.

## 2023-04-07 NOTE — Assessment & Plan Note (Signed)
Symptoms are consistent with chronic rhinitis. I will try him on azelastine spray at bed time to see if this will reduce his morning phlegm.

## 2023-04-07 NOTE — Assessment & Plan Note (Signed)
Weight is stable. Compensated. Continue carvedilol 6.25 mg bid and furosemide 20 mg daily.

## 2023-04-07 NOTE — Assessment & Plan Note (Signed)
Blood pressure is at goal. I will check a CBC today.

## 2023-04-07 NOTE — Assessment & Plan Note (Signed)
With patient's consent, I completed a form to document a DNR order.

## 2023-05-20 ENCOUNTER — Other Ambulatory Visit: Payer: Self-pay | Admitting: Cardiology

## 2023-05-20 ENCOUNTER — Other Ambulatory Visit: Payer: Self-pay | Admitting: Family Medicine

## 2023-05-20 DIAGNOSIS — B354 Tinea corporis: Secondary | ICD-10-CM

## 2023-05-24 ENCOUNTER — Telehealth: Payer: Self-pay | Admitting: Family Medicine

## 2023-05-24 NOTE — Telephone Encounter (Signed)
 ERROR

## 2023-07-04 DIAGNOSIS — H02102 Unspecified ectropion of right lower eyelid: Secondary | ICD-10-CM | POA: Diagnosis not present

## 2023-07-04 DIAGNOSIS — H401421 Capsular glaucoma with pseudoexfoliation of lens, left eye, mild stage: Secondary | ICD-10-CM | POA: Diagnosis not present

## 2023-08-09 ENCOUNTER — Other Ambulatory Visit: Payer: Self-pay | Admitting: Family Medicine

## 2023-08-09 DIAGNOSIS — I10 Essential (primary) hypertension: Secondary | ICD-10-CM

## 2023-09-18 ENCOUNTER — Encounter: Payer: Self-pay | Admitting: Family Medicine

## 2023-09-19 ENCOUNTER — Telehealth: Payer: Self-pay | Admitting: Family Medicine

## 2023-09-19 DIAGNOSIS — Z0279 Encounter for issue of other medical certificate: Secondary | ICD-10-CM

## 2023-09-19 NOTE — Telephone Encounter (Signed)
Form placed on providers desk to be signed. Dm/cma

## 2023-09-19 NOTE — Telephone Encounter (Signed)
 Patient dropped off document Handicap Placard, to be filled out by provider. Patient requested to send it back via Call Patient to pick up within 7-days. Document is located in providers tray at front office.Please advise at Mobile [phone] (mobile)   Pt dropped off to be filled out/  I put in the dr box

## 2023-09-19 NOTE — Telephone Encounter (Signed)
 Form signed and copy made for chart.  Patient son notified VIA phone that it is ready for pick up.   Placed up front office. Dm/cma

## 2023-10-06 ENCOUNTER — Ambulatory Visit: Payer: PPO | Admitting: Family Medicine

## 2023-10-06 ENCOUNTER — Ambulatory Visit: Payer: Self-pay | Admitting: Family Medicine

## 2023-10-06 ENCOUNTER — Ambulatory Visit (INDEPENDENT_AMBULATORY_CARE_PROVIDER_SITE_OTHER): Payer: PPO | Admitting: Family Medicine

## 2023-10-06 VITALS — BP 132/82 | HR 66 | Temp 97.6°F | Resp 99 | Ht 66.0 in | Wt 181.6 lb

## 2023-10-06 DIAGNOSIS — N1832 Chronic kidney disease, stage 3b: Secondary | ICD-10-CM | POA: Diagnosis not present

## 2023-10-06 DIAGNOSIS — I4891 Unspecified atrial fibrillation: Secondary | ICD-10-CM

## 2023-10-06 DIAGNOSIS — I10 Essential (primary) hypertension: Secondary | ICD-10-CM | POA: Diagnosis not present

## 2023-10-06 DIAGNOSIS — I25119 Atherosclerotic heart disease of native coronary artery with unspecified angina pectoris: Secondary | ICD-10-CM | POA: Diagnosis not present

## 2023-10-06 DIAGNOSIS — M65322 Trigger finger, left index finger: Secondary | ICD-10-CM

## 2023-10-06 DIAGNOSIS — E782 Mixed hyperlipidemia: Secondary | ICD-10-CM

## 2023-10-06 DIAGNOSIS — I5032 Chronic diastolic (congestive) heart failure: Secondary | ICD-10-CM | POA: Diagnosis not present

## 2023-10-06 LAB — LIPID PANEL
Cholesterol: 149 mg/dL (ref 0–200)
HDL: 43.9 mg/dL (ref >39.00)
LDL Cholesterol: 68 mg/dL (ref 0–99)
NonHDL: 105.05
Total CHOL/HDL Ratio: 3
Triglycerides: 186 mg/dL — ABNORMAL HIGH (ref 0.0–149.0)
VLDL: 37.2 mg/dL (ref 0.0–40.0)

## 2023-10-06 LAB — BASIC METABOLIC PANEL WITH GFR
BUN: 40 mg/dL — ABNORMAL HIGH (ref 6–23)
CO2: 30 meq/L (ref 19–32)
Calcium: 8.9 mg/dL (ref 8.4–10.5)
Chloride: 106 meq/L (ref 96–112)
Creatinine, Ser: 1.6 mg/dL — ABNORMAL HIGH (ref 0.40–1.50)
GFR: 35.93 mL/min — ABNORMAL LOW (ref >60.00)
Glucose, Bld: 71 mg/dL (ref 70–99)
Potassium: 4.8 meq/L (ref 3.5–5.1)
Sodium: 142 meq/L (ref 135–145)

## 2023-10-06 NOTE — Assessment & Plan Note (Signed)
I will check annual lipids today.  Continue atorvastatin 40 mg daily.

## 2023-10-06 NOTE — Assessment & Plan Note (Signed)
 Blood pressure is at goal. I will check annual renal labs today.

## 2023-10-06 NOTE — Assessment & Plan Note (Signed)
 Stable. No current angina. Continue aspirin 81 mg daily.

## 2023-10-06 NOTE — Progress Notes (Signed)
 [facility] [address] [place] [address] Dept: [phone] Dept Fax: [phone]  Chronic Care Office Visit  Subjective:    Patient ID: [name], male    DOB: [birth year], 89 y.o..   MRN: [id number]  Chief Complaint  Patient presents with   Hypertension    6 month f/u . C/o having his finger lock on the LT hand.     History of Present Illness:  Patient is in today for reassessment of chronic medical issues.  [name] has a history of CAD and chronic diastolic heart failure. His heart failure is managed on carvedilol  6.25 mg bid and furosemide  40 mg daily. He is not noting any significant edema. He has some chronic dyspnea, esp. with exertion, but this has not been worse recently. He is still getting out, having recently m[place] 6 [place].   [name] has a history of hypertension. He is currently on amlodipine   2.5 mg daily and hydralazine  50 mg TID.   [name] has a history of hyperlipidemia. He is managed on atorvastatin  40 mg daily.  [name] notes that his left 2nd finger has been triggering. He finds this impairs his crocheting and his fiddle playing.  Past Medical History: Patient Active Problem List   Diagnosis Date Noted   Trigger index finger of left hand 10/06/2023   Chronic rhinitis 04/07/2023   DNR (do not resuscitate) 04/07/2023   Melanoma of skin (HCC)- Left upper arm 09/29/2022   Chronic left-sided low back pain with left-sided sciatica 06/21/2022   Chronic diastolic heart failure (HCC) 09/08/2021   Acute on chronic diastolic CHF (congestive heart failure) (HCC) 07/29/2021   Elevated troponin 07/29/2021   Normocytic anemia 07/29/2021   Weakness 07/29/2021   Demand ischemia (HCC)    Pedal edema 04/16/2021   Stage 3b chronic kidney disease (CKD) (HCC) 04/09/2021   Glaucoma 11/30/2020   Carotid artery disease (HCC) 11/12/2013   Claudication (HCC) 12/12/2012   Peripheral  vascular disease (HCC) 12/12/2012   Carotid bruit 08/23/2011   Atrial fibrillation with RVR (HCC) 01/12/2011   Essential hypertension    Hyperlipidemia    Coronary artery disease    Status post aortic valve replacement with tissue    Past Surgical History:  Procedure Laterality Date   APPENDECTOMY     CARDIAC CATHETERIZATION  04/02/2007   EF 60%/severe two-vessel obstructive coronary artery disease/patent saphenous bein graft to rt coronary/ severe stenosis at the ostium of the lt internal mammary artery graft to the abtuse marginal branch/normal lt ventricular function/ severe aortic stenosis/normal rt heart pressures   CARDIAC CATHETERIZATION  07/25/2002   EF 65%/two-vessel obstructive atherosclerotic coronary artery disease/ patent saphenous vein graft to the distal rt coronary arter//patent lt internal mammary artery graft to the obtuse marginal branch/normal lt ventricular function   CARDIAC CATHETERIZATION  05/17/1991   EF65%/two-vessel ovstructive atherosclerotic coronary artery disease/good lt ventricular performance/compared with previous catheterization there is now total occlusion of the tr coronary artery at the prior angioplasty site/good collateral flow to the distal rt coronary artery is seen/there is also progressive disease in the lt circumflex vessel now to obstructive levels   CARDIAC CATHETERIZATION  07/26/1990   EF60%/borderline obstructive coronary artery disease in the mid lt circumflex & in the rt coronary artery prior angioplasty site/normal lt ventricular function   CARDIAC CATHETERIZATION  05/23/1990   successful percutaneous transluminal coronary angioplasty of the proximal rt coronary artery   CATARACT EXTRACTION W/ INTRAOCULAR  LENS IMPLANT Bilateral    CORONARY ANGIOPLASTY  07/12/1990   single vessel obstructive atherosclerotic coronary atrery disease in the rt corornary artery with restenosis of the primary angioplasty site/successful repeat PTCA of the proximal  rt coronary arter   CORONARY ARTERY BYPASS GRAFT     CORONARY ARTERY BYPASS GRAFT     x2 using a reverse saphenous vein graft to posterior descending, reseverse saphenous vein graft to lt internal mammary artery at the previous bypass to the circumflex with a no vein harvesting   GANGLION CYST EXCISION Right    Wrist   HEMORRHOID SURGERY     at age 65   INGUINAL HERNIA REPAIR  05/02/1965   PENILE PROSTHESIS IMPLANT  05/03/1987   STERNOTOMY     redo median with aortic valve replacement with a pericardial tissue valve/[employer] model 3000,81mm,serial numver [id number]   TONSILLECTOMY     at age 41   VASECTOMY     at age 65   Family History  Problem Relation Age of Onset   Cancer Father 30       intestinal   Heart attack Mother 5   Stroke Sister    Outpatient Medications Prior to Visit  Medication Sig Dispense Refill   amLODipine  (NORVASC ) 2.5 MG tablet TAKE 1 TABLET BY MOUTH EVERY DAY 90 tablet 3   aspirin  (ASPIRIN  CHILDRENS) 81 MG chewable tablet Chew 1 tablet (81 mg total) by mouth daily. 36 tablet 11   atorvastatin  (LIPITOR) 40 MG tablet Take 1 tablet (40 mg total) by mouth daily. 90 tablet 3   azelastine  (ASTELIN ) 0.1 % nasal spray Place 2 sprays into both nostrils at bedtime. Use in each nostril as directed 30 mL 12   carvedilol  (COREG ) 6.25 MG tablet Take 1 tablet (6.25 mg total) by mouth 2 (two) times daily with a meal. 180 tablet 3   COMBIGAN  0.2-0.5 % ophthalmic solution Place 1 drop into the left eye every 12 (twelve) hours.      furosemide  (LASIX ) 40 MG tablet Take 1 tablet (40 mg total) by mouth daily. 90 tablet 3   hydrALAZINE  (APRESOLINE ) 50 MG tablet TAKE 1 TABLET BY MOUTH THREE TIMES A DAY 270 tablet 3   latanoprost  (XALATAN ) 0.005 % ophthalmic solution SMARTSIG:In Eye(s)     nitroGLYCERIN  (NITROSTAT ) 0.4 MG SL tablet Place 1 tablet (0.4 mg total) under the tongue every 5 (five) minutes as needed. 25 tablet 1   TRAVATAN Z 0.004 % SOLN ophthalmic solution  Place 1 drop into the left eye at bedtime.      No facility-administered medications prior to visit.   Allergies  Allergen Reactions   Codeine     Hallucinations   Penicillins Hives    Did it involve swelling of the face/tongue/throat, SOB, or low BP? No Did it involve sudden or severe rash/hives, skin peeling, or any reaction on the inside of your mouth or nose? Yes Did you need to seek medical attention at a hospital or doctor's office? No When did it last happen?     Decades ago  If all above answers are "NO", may proceed with cephalosporin use.     Objective:   Today's Vitals   10/06/23 0851  BP: 132/82  Pulse: 66  Resp: (!) 99  Temp: 97.6 F (36.4 C)  TempSrc: Temporal  Weight: 181 lb 9.6 oz (82.4 kg)  Height: 5\' 6"  (1.676 m)   Body mass index is 29.31 kg/m.   General: Well developed, well nourished. No  acute distress. CV: RRR without murmurs or rubs. Pulses 2+ bilaterally. Extremities: No LE edema noted. The left 2nd finger exhibits triggering with flexion. Psych: Alert and oriented. Normal mood and affect.  Health Maintenance Due  Topic Date Due   Medicare Annual Wellness (AWV)  Never done   Zoster Vaccines- Shingrix (1 of 2) Never done     Assessment & Plan:   Problem List Items Addressed This Visit       Cardiovascular and Mediastinum   Atrial fibrillation with RVR (HCC)   Stable. Rate well-controlled. Continue daily aspirin  and carvedilol  6.25 mg bid.      Chronic diastolic heart failure (HCC)   Weight is stable. Compensated. Continue carvedilol  6.25 mg bid and furosemide  20 mg daily.      Coronary artery disease   Stable. No current angina. Continue aspirin  81 mg daily.      Essential hypertension - Primary   Blood pressure is in adequate control. Continue amlodipine   2.5 mg daily and hydralazine  50 mg TID.       Relevant Orders   Basic metabolic panel with GFR     Musculoskeletal and Integument   Trigger index finger of left hand   I  will refer [name] to orthopedics for a steroid injection.      Relevant Orders   Ambulatory referral to Orthopedics     Genitourinary   Stage 3b chronic kidney disease (CKD) (HCC)   Blood pressure is at goal. I will check annual renal labs today.      Relevant Orders   Basic metabolic panel with GFR     Other   Hyperlipidemia   I will check annual lipids today. Continue atorvastatin  40 mg daily.      Relevant Orders   Lipid panel    Return in about 4 months (around 02/05/2024) for Reassessment.   Graig Lawyer, MD

## 2023-10-06 NOTE — Assessment & Plan Note (Signed)
 I will refer Mr. [name] to orthopedics for a steroid injection.

## 2023-10-06 NOTE — Assessment & Plan Note (Signed)
Stable. Rate well-controlled. Continue daily aspirin and carvedilol 6.25 mg bid.

## 2023-10-06 NOTE — Assessment & Plan Note (Signed)
 Weight is stable. Compensated. Continue carvedilol 6.25 mg bid and furosemide 20 mg daily.

## 2023-10-06 NOTE — Assessment & Plan Note (Signed)
 Blood pressure is in adequate control. Continue amlodipine   2.5 mg daily and hydralazine  50 mg TID.

## 2023-10-24 NOTE — Progress Notes (Signed)
 [name] Date of Birth: 03-20-1925 Medical Record #[id number]  History of Present Illness: [name] is seen for follow up of CAD. He is s/p redo CABG and AVR in 2008.  He did have carotid dopplers in August 2017  which showed 40-59% bilateral ICA stenosis. Repeat in 2019 showed < 39% stenosis bilaterally.    Myoview  study in May 2013 was normal. Echo in 2012 showed normal LV function and mild aortic stenosis. He also had LE arterial dopplers in August 2016 which showed mild vascular disease with good ABIs.   He was admitted in December 2022 with HA, severe HTN and mental status changes. Neuro imaging was negative. Echocardiogram shows preserved LVEF, grade 1 diastolic dysfunction, severe LAE, mod RAE, well-seated AVR without leak. Increased hydralazine  50mg  > 100mg  TID beginning 12/9. With addition of other medications he became orthostatic, so this was returned to PTA dosing with improvement.  Changed metoprolol  to coreg .  Added norvasc  2.5mg  (hx LE swelling due to this at higher dose).  He was seen in the ED on 06/11/21 after getting up from a nap he fell. Had laceration of scalp. CT negative.   He was admitted to the hospital on 07/29/2021. He was discharged on 08/02/2021. He was noted to have generalized weakness and dyspnea. He was diagnosed with decompensated CHF. He received IV diuresis. His elevated troponins were felt to be related to demand ischemia. His discharge weight was 182 pounds. His echocardiogram showed preserved EF.   He is now 89 years old.  No syncope. Does get SOB when bending over.  No chest pain. Tries to watch his salt intake. Does use a riding mower and crochets for church. Plays the fiddle some.    Current Outpatient Medications on File Prior to Visit  Medication Sig Dispense Refill   amLODipine  (NORVASC ) 2.5 MG tablet TAKE 1 TABLET BY MOUTH EVERY DAY 90 tablet 3   aspirin  (ASPIRIN  CHILDRENS) 81 MG chewable tablet Chew 1 tablet (81 mg total) by mouth daily. 36 tablet  11   atorvastatin  (LIPITOR) 40 MG tablet Take 1 tablet (40 mg total) by mouth daily. 90 tablet 3   azelastine  (ASTELIN ) 0.1 % nasal spray Place 2 sprays into both nostrils at bedtime. Use in each nostril as directed 30 mL 12   carvedilol  (COREG ) 6.25 MG tablet Take 1 tablet (6.25 mg total) by mouth 2 (two) times daily with a meal. 180 tablet 3   COMBIGAN  0.2-0.5 % ophthalmic solution Place 1 drop into the left eye every 12 (twelve) hours.      furosemide  (LASIX ) 40 MG tablet Take 1 tablet (40 mg total) by mouth daily. 90 tablet 3   hydrALAZINE  (APRESOLINE ) 50 MG tablet TAKE 1 TABLET BY MOUTH THREE TIMES A DAY 270 tablet 3   latanoprost  (XALATAN ) 0.005 % ophthalmic solution SMARTSIG:In Eye(s)     nitroGLYCERIN  (NITROSTAT ) 0.4 MG SL tablet Place 1 tablet (0.4 mg total) under the tongue every 5 (five) minutes as needed. 25 tablet 1   TRAVATAN Z 0.004 % SOLN ophthalmic solution Place 1 drop into the left eye at bedtime.      No current facility-administered medications on file prior to visit.    Allergies  Allergen Reactions   Codeine     Hallucinations   Penicillins Hives    Did it involve swelling of the face/tongue/throat, SOB, or low BP? No Did it involve sudden or severe rash/hives, skin peeling, or any reaction on the inside of your mouth or nose?  Yes Did you need to seek medical attention at a hospital or doctor's office? No When did it last happen?     Decades ago  If all above answers are NO, may proceed with cephalosporin use.      Past Medical History:  Diagnosis Date   Carotid arterial disease (HCC)    Doppler in 2003 with 40 to 60% L ICA   Carpal tunnel syndrome    Coronary artery disease    s/p CABG & redo CABG with AVR   Hyperlipidemia    Hypertension    Meningitis    Normal echocardiogram Sept 2012   Has normal EF, mild AS and moderate LAE   PAF (paroxysmal atrial fibrillation) [facility]) Sept 2012   converted spontaneously   PUD (peptic ulcer disease)    Remote  history   Status post aortic valve replacement with tissue    for aortic stenosis    Past Surgical History:  Procedure Laterality Date   APPENDECTOMY     CARDIAC CATHETERIZATION  04/02/2007   EF 60%/severe two-vessel obstructive coronary artery disease/patent saphenous bein graft to rt coronary/ severe stenosis at the ostium of the lt internal mammary artery graft to the abtuse marginal branch/normal lt ventricular function/ severe aortic stenosis/normal rt heart pressures   CARDIAC CATHETERIZATION  07/25/2002   EF 65%/two-vessel obstructive atherosclerotic coronary artery disease/ patent saphenous vein graft to the distal rt coronary arter//patent lt internal mammary artery graft to the obtuse marginal branch/normal lt ventricular function   CARDIAC CATHETERIZATION  05/17/1991   EF65%/two-vessel ovstructive atherosclerotic coronary artery disease/good lt ventricular performance/compared with previous catheterization there is now total occlusion of the tr coronary artery at the prior angioplasty site/good collateral flow to the distal rt coronary artery is seen/there is also progressive disease in the lt circumflex vessel now to obstructive levels   CARDIAC CATHETERIZATION  07/26/1990   EF60%/borderline obstructive coronary artery disease in the mid lt circumflex & in the rt coronary artery prior angioplasty site/normal lt ventricular function   CARDIAC CATHETERIZATION  05/23/1990   successful percutaneous transluminal coronary angioplasty of the proximal rt coronary artery   CATARACT EXTRACTION W/ INTRAOCULAR LENS IMPLANT Bilateral    CORONARY ANGIOPLASTY  07/12/1990   single vessel obstructive atherosclerotic coronary atrery disease in the rt corornary artery with restenosis of the primary angioplasty site/successful repeat PTCA of the proximal rt coronary arter   CORONARY ARTERY BYPASS GRAFT     CORONARY ARTERY BYPASS GRAFT     x2 using a reverse saphenous vein graft to posterior  descending, reseverse saphenous vein graft to lt internal mammary artery at the previous bypass to the circumflex with a no vein harvesting   GANGLION CYST EXCISION Right    Wrist   HEMORRHOID SURGERY     at age 31   INGUINAL HERNIA REPAIR  05/02/1965   PENILE PROSTHESIS IMPLANT  05/03/1987   STERNOTOMY     redo median with aortic valve replacement with a pericardial tissue valve/[employer] model 3000,71mm,serial numver [id number]   TONSILLECTOMY     at age 50   VASECTOMY     at age 33    Social History   Tobacco Use  Smoking Status Former   Current packs/day: 0.00   Average packs/day: 1.5 packs/day for 41.0 years (61.5 ttl pk-yrs)   Types: Cigarettes, Cigars   Start date: 05/02/1941   Quit date: 05/02/1982   Years since quitting: 41.5  Smokeless Tobacco Never    Social History   Substance  and Sexual Activity  Alcohol Use No    Family History  Problem Relation Age of Onset   Cancer Father 20       intestinal   Heart attack Mother 22   Stroke Sister     Review of Systems: The review of systems is per the HPI.  All other systems were reviewed and are negative.  Physical Exam: BP (!) 146/57 (BP Location: Left Arm, Patient Position: Sitting, Cuff Size: Normal)   Pulse 63   Ht 5' 6 (1.676 m)   Wt 180 lb (81.6 kg)   SpO2 95%   BMI 29.05 kg/m  GENERAL:  Well appearing elderly WM in NAD HEENT:  PERRL, EOMI, sclera are clear. Oropharynx is clear.  NECK:  No jugular venous distention,soft bilateral bruits, no thyromegaly or adenopathy LUNGS:  Clear to auscultation bilaterally CHEST:  Unremarkable HEART:  RRR,  PMI not displaced or sustained,S1 and S2 within normal limits, no S3, no S4: no clicks, no rubs, soft systolic murmur RUSB ABD:  Soft, nontender. BS +, no masses or bruits. No hepatomegaly, no splenomegaly EXT:  2 + pulses throughout,  2+ pretibial edema, no cyanosis no clubbing SKIN:  Warm and dry.  No rashes NEURO:  Alert and oriented x 3. Cranial  nerves II through XII intact. PSYCH:  Cognitively intact  LABORATORY DATA: Lab Results  Component Value Date   WBC 7.6 04/07/2023   HGB 11.7 (L) 04/07/2023   HCT 33.2 (L) 04/07/2023   PLT 183.0 04/07/2023   GLUCOSE 71 10/06/2023   CHOL 149 10/06/2023   TRIG 186.0 (H) 10/06/2023   HDL 43.90 10/06/2023   LDLCALC 68 10/06/2023   ALT 12 07/29/2021   AST 20 07/29/2021   NA 142 10/06/2023   K 4.8 10/06/2023   CL 106 10/06/2023   CREATININE 1.60 (H) 10/06/2023   BUN 40 (H) 10/06/2023   CO2 30 10/06/2023   TSH 3.515 07/29/2021   INR 0.9 04/08/2021   HGBA1C 6.1 (H) 07/02/2019   EKG Interpretation Date/Time:  Friday October 27 2023 10:59:10 EDT Ventricular Rate:  62 PR Interval:  218 QRS Duration:  150 QT Interval:  474 QTC Calculation: 481 R Axis:   27  Text Interpretation: Sinus rhythm with marked sinus arrhythmia with 1st degree A-V block Left bundle branch block When compared with ECG of 31-Oct-2022 09:20, No significant change was found Confirmed by [place], Mkayla Steele [phone]) on 10/27/2023 11:04:52 AM   EKG Interpretation Date/Time:  Friday October 27 2023 10:59:10 EDT Ventricular Rate:  62 PR Interval:  218 QRS Duration:  150 QT Interval:  474 QTC Calculation: 481 R Axis:   27  Text Interpretation: Sinus rhythm with marked sinus arrhythmia with 1st degree A-V block Left bundle branch block When compared with ECG of 31-Oct-2022 09:20, No significant change was found Confirmed by [place], Monserath Neff [phone]) on 10/27/2023 11:04:52 AM   Echo 07/30/21: IMPRESSIONS     1. Left ventricular ejection fraction, by estimation, is 50 to 55%. The  left ventricle has low normal function. The left ventricle has no regional  wall motion abnormalities. There is moderate concentric left ventricular  hypertrophy. Left ventricular  diastolic parameters are indeterminate.   2. Right ventricular systolic function is moderately reduced. The right  ventricular size is moderately enlarged. There is normal  pulmonary artery  systolic pressure.   3. Left atrial size was severely dilated.   4. Right atrial size was mildly dilated.   5. The mitral valve is grossly normal.  Mild mitral valve regurgitation.  No evidence of mitral stenosis. Severe mitral annular calcification.   6. The aortic valve has been repaired/replaced. Aortic valve  regurgitation is not visualized. There is a 23 mm [employer] bovine valve  present in the aortic position. Procedure Date: 2009. Echo findings are  consistent with normal structure and function of  the aortic valve prosthesis. Aortic valve mean gradient measures 9.0 mmHg.   7. The inferior vena cava is dilated in size with >50% respiratory  variability, suggesting right atrial pressure of 8 mmHg.   Comparison(s): No significant change from prior study.   Conclusion(s)/Recommendation(s): Otherwise normal echocardiogram, with  minor abnormalities described in the report.   Assessment / Plan: 1. Coronary disease status post redo CABG with aortic valve replacement with a pericardial tissue valve in 2008. Myoview  study in May of 2013 was normal. He has stable class 1-2 angina. Continue current therapy  2. Hyperlipidemia. On lipitor 40 mg daily.   Encourage heart healthy eating. LDL 68  3. Hypertension. Blood pressure control is adequate  4. Status post tissue aortic valve replacement. Stable by exam.   5.  Carotid arterial disease. Mild and asymptomatic.  6. Edema. Looks good today. Continue current lasix  dose  Will follow up in one year unless needed.

## 2023-10-27 ENCOUNTER — Ambulatory Visit: Attending: Cardiology | Admitting: Cardiology

## 2023-10-27 ENCOUNTER — Encounter: Payer: Self-pay | Admitting: Cardiology

## 2023-10-27 VITALS — BP 146/57 | HR 63 | Ht 66.0 in | Wt 180.0 lb

## 2023-10-27 DIAGNOSIS — I10 Essential (primary) hypertension: Secondary | ICD-10-CM | POA: Diagnosis not present

## 2023-10-27 NOTE — Patient Instructions (Signed)
 Medication Instructions:  Continue same medications *If you need a refill on your cardiac medications before your next appointment, please call your pharmacy*  Lab Work: None ordered  Testing/Procedures: None ordered  Follow-Up: At [facility], you and your health needs are our priority.  As part of our continuing mission to provide you with exceptional heart care, our providers are all part of one team.  This team includes your primary Cardiologist (physician) and Advanced Practice Providers or APPs (Physician Assistants and Nurse Practitioners) who all work together to provide you with the care you need, when you need it.  Your next appointment:  1 year     Call in March to schedule June appointment    Provider:  Dr.Jordan   We recommend signing up for the patient portal called MyChart.  Sign up information is provided on this After Visit Summary.  MyChart is used to connect with patients for Virtual Visits (Telemedicine).  Patients are able to view lab/test results, encounter notes, upcoming appointments, etc.  Non-urgent messages can be sent to your provider as well.   To learn more about what you can do with MyChart, go to [email or web address].

## 2023-12-05 DIAGNOSIS — H6123 Impacted cerumen, bilateral: Secondary | ICD-10-CM | POA: Diagnosis not present

## 2023-12-05 DIAGNOSIS — H903 Sensorineural hearing loss, bilateral: Secondary | ICD-10-CM | POA: Insufficient documentation

## 2023-12-14 DIAGNOSIS — M65331 Trigger finger, right middle finger: Secondary | ICD-10-CM | POA: Insufficient documentation

## 2023-12-14 DIAGNOSIS — M65341 Trigger finger, right ring finger: Secondary | ICD-10-CM | POA: Diagnosis not present

## 2023-12-14 DIAGNOSIS — M65322 Trigger finger, left index finger: Secondary | ICD-10-CM | POA: Insufficient documentation

## 2024-01-03 ENCOUNTER — Other Ambulatory Visit: Payer: Self-pay | Admitting: Cardiology

## 2024-01-11 DIAGNOSIS — M65322 Trigger finger, left index finger: Secondary | ICD-10-CM | POA: Diagnosis not present

## 2024-01-25 DIAGNOSIS — M25642 Stiffness of left hand, not elsewhere classified: Secondary | ICD-10-CM | POA: Diagnosis not present

## 2024-02-07 ENCOUNTER — Other Ambulatory Visit: Payer: Self-pay | Admitting: Family Medicine

## 2024-02-07 ENCOUNTER — Other Ambulatory Visit: Payer: Self-pay | Admitting: Cardiology

## 2024-02-07 DIAGNOSIS — J310 Chronic rhinitis: Secondary | ICD-10-CM

## 2024-02-07 DIAGNOSIS — I5032 Chronic diastolic (congestive) heart failure: Secondary | ICD-10-CM

## 2024-02-09 ENCOUNTER — Encounter: Payer: Self-pay | Admitting: Family Medicine

## 2024-02-09 ENCOUNTER — Ambulatory Visit: Admitting: Family Medicine

## 2024-02-09 VITALS — BP 124/70 | HR 66 | Temp 97.4°F | Ht 66.0 in | Wt 173.0 lb

## 2024-02-09 DIAGNOSIS — I5032 Chronic diastolic (congestive) heart failure: Secondary | ICD-10-CM | POA: Diagnosis not present

## 2024-02-09 DIAGNOSIS — S8011XA Contusion of right lower leg, initial encounter: Secondary | ICD-10-CM | POA: Diagnosis not present

## 2024-02-09 DIAGNOSIS — I25119 Atherosclerotic heart disease of native coronary artery with unspecified angina pectoris: Secondary | ICD-10-CM | POA: Diagnosis not present

## 2024-02-09 DIAGNOSIS — N1832 Chronic kidney disease, stage 3b: Secondary | ICD-10-CM

## 2024-02-09 DIAGNOSIS — Z23 Encounter for immunization: Secondary | ICD-10-CM | POA: Diagnosis not present

## 2024-02-09 DIAGNOSIS — I4891 Unspecified atrial fibrillation: Secondary | ICD-10-CM

## 2024-02-09 DIAGNOSIS — Z85820 Personal history of malignant melanoma of skin: Secondary | ICD-10-CM | POA: Insufficient documentation

## 2024-02-09 DIAGNOSIS — I10 Essential (primary) hypertension: Secondary | ICD-10-CM | POA: Diagnosis not present

## 2024-02-09 DIAGNOSIS — M65331 Trigger finger, right middle finger: Secondary | ICD-10-CM

## 2024-02-09 NOTE — Assessment & Plan Note (Signed)
 Stable. No current angina. Continue aspirin 81 mg daily.

## 2024-02-09 NOTE — Addendum Note (Signed)
 Addended by: THEDORA GARNETTE HERO on: 02/09/2024 09:54 AM   Modules accepted: Level of Service

## 2024-02-09 NOTE — Assessment & Plan Note (Signed)
 Recommend follow-up with Dr. Gramig to consider if a tendon release may be needed.

## 2024-02-09 NOTE — Assessment & Plan Note (Signed)
 Blood pressure is in adequate control. Continue amlodipine   2.5 mg daily and hydralazine  50 mg TID.

## 2024-02-09 NOTE — Progress Notes (Addendum)
 [facility] [address] [place] [address] Dept: [phone] Dept Fax: [phone]  Chronic Care Office Visit  Subjective:    Patient ID: [name], male    DOB: 1924/12/12, 89 y.o..   MRN: [id number]  Chief Complaint  Patient presents with   Hypertension    4 month f/u HTN.  No concerns.  Flu shot.    History of Present Illness:  Patient is in today for reassessment of chronic medical issues.  [name] has a history of CAD and chronic diastolic heart failure. His heart failure is managed on carvedilol  6.25 mg bid and furosemide  40 mg daily. He is not noting any significant edema. He has some chronic dyspnea, esp. with exertion, but this has not been worse recently.   [name] does note that he feel off of his mowing machine last week. He had gotten the mower hung up in a gully. While trying to remount the mower, he fell forward over the front of the machine. He notes he has abrasions on his lower leg and ahs had some swelling. he is able to bear weight without pain.   [name] has a history of hypertension. He is currently on amlodipine   2.5 mg daily and hydralazine  50 mg TID.   [name] has a history of hyperlipidemia. He is managed on atorvastatin  40 mg daily.   [name] was recently seen by Dr. Camella for bilateral trigger fingers. He did have a release of the left 2nd finger and finds this is improved. He had an injection of the right 3rd finger, but this has not resolved the issue.  Past Medical History: Patient Active Problem List   Diagnosis Date Noted   History of melanoma 02/09/2024   Acquired trigger finger of left index finger 12/14/2023   Acquired trigger finger of right middle finger 12/14/2023   Sensorineural hearing loss (SNHL), bilateral 12/05/2023   Chronic rhinitis 04/07/2023   DNR (do not resuscitate) 04/07/2023   Chronic left-sided low back pain with left-sided sciatica  06/21/2022   Chronic diastolic heart failure (HCC) 09/08/2021   Elevated troponin 07/29/2021   Normocytic anemia 07/29/2021   Weakness 07/29/2021   Demand ischemia (HCC)    Pedal edema 04/16/2021   Stage 3b chronic kidney disease (CKD) (HCC) 04/09/2021   Glaucoma 11/30/2020   Carotid artery disease 11/12/2013   Claudication 12/12/2012   Peripheral vascular disease 12/12/2012   Carotid bruit 08/23/2011   Atrial fibrillation with RVR (HCC) 01/12/2011   Essential hypertension    Hyperlipidemia    Coronary artery disease    Status post aortic valve replacement with tissue    Past Surgical History:  Procedure Laterality Date   APPENDECTOMY     CARDIAC CATHETERIZATION  04/02/2007   EF 60%/severe two-vessel obstructive coronary artery disease/patent saphenous bein graft to rt coronary/ severe stenosis at the ostium of the lt internal mammary artery graft to the abtuse marginal branch/normal lt ventricular function/ severe aortic stenosis/normal rt heart pressures   CARDIAC CATHETERIZATION  07/25/2002   EF 65%/two-vessel obstructive atherosclerotic coronary artery disease/ patent saphenous vein graft to the distal rt coronary arter//patent lt internal mammary artery graft to the obtuse marginal branch/normal lt ventricular function   CARDIAC CATHETERIZATION  05/17/1991   EF65%/two-vessel ovstructive atherosclerotic coronary artery disease/good lt ventricular performance/compared with previous catheterization there is now total occlusion of the tr coronary artery at the prior angioplasty site/good collateral flow to the distal rt coronary artery is  seen/there is also progressive disease in the lt circumflex vessel now to obstructive levels   CARDIAC CATHETERIZATION  07/26/1990   EF60%/borderline obstructive coronary artery disease in the mid lt circumflex & in the rt coronary artery prior angioplasty site/normal lt ventricular function   CARDIAC CATHETERIZATION  05/23/1990   successful  percutaneous transluminal coronary angioplasty of the proximal rt coronary artery   CATARACT EXTRACTION W/ INTRAOCULAR LENS IMPLANT Bilateral    CORONARY ANGIOPLASTY  07/12/1990   single vessel obstructive atherosclerotic coronary atrery disease in the rt corornary artery with restenosis of the primary angioplasty site/successful repeat PTCA of the proximal rt coronary arter   CORONARY ARTERY BYPASS GRAFT     CORONARY ARTERY BYPASS GRAFT     x2 using a reverse saphenous vein graft to posterior descending, reseverse saphenous vein graft to lt internal mammary artery at the previous bypass to the circumflex with a no vein harvesting   GANGLION CYST EXCISION Right    Wrist   HEMORRHOID SURGERY     at age 5   INGUINAL HERNIA REPAIR  05/02/1965   PENILE PROSTHESIS IMPLANT  05/03/1987   STERNOTOMY     redo median with aortic valve replacement with a pericardial tissue valve/[employer] model 3000,54mm,serial numver [id number]   TONSILLECTOMY     at age 18   VASECTOMY     at age 33   Family History  Problem Relation Age of Onset   Cancer Father 34       intestinal   Heart attack Mother 12   Stroke Sister    Outpatient Medications Prior to Visit  Medication Sig Dispense Refill   amLODipine  (NORVASC ) 2.5 MG tablet TAKE 1 TABLET BY MOUTH EVERY DAY 90 tablet 3   aspirin  (ASPIRIN  CHILDRENS) 81 MG chewable tablet Chew 1 tablet (81 mg total) by mouth daily. 36 tablet 11   atorvastatin  (LIPITOR) 40 MG tablet TAKE 1 TABLET BY MOUTH EVERY DAY 90 tablet 3   Azelastine  HCl 137 MCG/SPRAY SOLN PLACE 2 SPRAYS INTO BOTH NOSTRILS AT BEDTIME. USE IN EACH NOSTRIL AS DIRECTED 30 mL 4   carvedilol  (COREG ) 6.25 MG tablet TAKE 1 TABLET BY MOUTH 2 TIMES DAILY WITH A MEAL. 180 tablet 3   COMBIGAN  0.2-0.5 % ophthalmic solution Place 1 drop into the left eye every 12 (twelve) hours.      furosemide  (LASIX ) 40 MG tablet TAKE 1 TABLET BY MOUTH EVERY DAY 90 tablet 3   hydrALAZINE  (APRESOLINE ) 50 MG tablet TAKE  1 TABLET BY MOUTH THREE TIMES A DAY 270 tablet 3   latanoprost  (XALATAN ) 0.005 % ophthalmic solution SMARTSIG:In Eye(s)     nitroGLYCERIN  (NITROSTAT ) 0.4 MG SL tablet Place 1 tablet (0.4 mg total) under the tongue every 5 (five) minutes as needed. 25 tablet 1   TRAVATAN Z 0.004 % SOLN ophthalmic solution Place 1 drop into the left eye at bedtime.      No facility-administered medications prior to visit.   Allergies  Allergen Reactions   Codeine     Hallucinations   Penicillins Hives    Did it involve swelling of the face/tongue/throat, SOB, or low BP? No Did it involve sudden or severe rash/hives, skin peeling, or any reaction on the inside of your mouth or nose? Yes Did you need to seek medical attention at a hospital or doctor's office? No When did it last happen?     Decades ago  If all above answers are NO, may proceed with cephalosporin use.  Objective:   Today's Vitals   02/09/24 0832  BP: 124/70  Pulse: 66  Temp: (!) 97.4 F (36.3 C)  TempSrc: Temporal  SpO2: 96%  Weight: 173 lb (78.5 kg)  Height: 5' 6 (1.676 m)   Body mass index is 27.92 kg/m.   General: Well developed, well nourished. No acute distress. CV: RRR without murmurs or rubs. Pulses 2+ bilaterally. Extremities: There are healing abrasions to the anterior aspect of the right lower leg. There is no sign of infection.   There is mild edema more laterally in the lower leg. There is ratcheting of the right 3rd finger . Psych: Alert and oriented. Normal mood and affect.  Health Maintenance Due  Topic Date Due   Medicare Annual Wellness (AWV)  Never done   Zoster Vaccines- Shingrix (1 of 2) Never done     Assessment & Plan:   Problem List Items Addressed This Visit       Cardiovascular and Mediastinum   Atrial fibrillation with RVR (HCC) - Primary   Stable. Rate well-controlled. Continue daily aspirin  and carvedilol  6.25 mg bid.      Chronic diastolic heart failure (HCC)   Weight is stable.  Compensated. Continue carvedilol  6.25 mg bid and furosemide  20 mg daily.      Coronary artery disease   Stable. No current angina. Continue aspirin  81 mg daily.      Essential hypertension   Blood pressure is in adequate control. Continue amlodipine   2.5 mg daily and hydralazine  50 mg TID.         Musculoskeletal and Integument   Acquired trigger finger of right middle finger   Recommend follow-up with Dr. Camella to consider if a tendon release may be needed.        Genitourinary   Stage 3b chronic kidney disease (CKD) (HCC)   Continue focus on blood pressure, adequate hydration, and avoidance of nephrotoxic medications.       Other Visit Diagnoses       Contusion of right lower leg, initial encounter       Appears minor. Healing well. Localized edema shoudl resolve on its own.     Need for immunization against influenza       Relevant Orders   Flu vaccine HIGH DOSE PF(Fluzone Trivalent) (Completed)       Return in about 5 months (around 07/09/2024) for Reassessment.   Garnette CHRISTELLA Simpler, MD

## 2024-02-09 NOTE — Assessment & Plan Note (Signed)
 Weight is stable. Compensated. Continue carvedilol 6.25 mg bid and furosemide 20 mg daily.

## 2024-02-09 NOTE — Assessment & Plan Note (Signed)
Stable. Rate well-controlled. Continue daily aspirin and carvedilol 6.25 mg bid.

## 2024-02-09 NOTE — Assessment & Plan Note (Signed)
 Continue focus on blood pressure, adequate hydration, and avoidance of nephrotoxic medications.

## 2024-02-15 DIAGNOSIS — M65331 Trigger finger, right middle finger: Secondary | ICD-10-CM | POA: Diagnosis not present

## 2024-03-12 DIAGNOSIS — Z961 Presence of intraocular lens: Secondary | ICD-10-CM | POA: Diagnosis not present

## 2024-03-12 DIAGNOSIS — H401421 Capsular glaucoma with pseudoexfoliation of lens, left eye, mild stage: Secondary | ICD-10-CM | POA: Diagnosis not present

## 2024-08-09 ENCOUNTER — Ambulatory Visit: Admitting: Family Medicine
